# Patient Record
Sex: Male | Born: 1940 | Race: Black or African American | Hispanic: No | Marital: Single | Smoking: Never smoker
Health system: Southern US, Community
[De-identification: ages and names within clinical notes are randomized; demographics above are authoritative.]

## PROBLEM LIST (undated history)

## (undated) DIAGNOSIS — I10 Essential (primary) hypertension: Secondary | ICD-10-CM

## (undated) DIAGNOSIS — Z531 Procedure and treatment not carried out because of patient's decision for reasons of belief and group pressure: Secondary | ICD-10-CM

## (undated) DIAGNOSIS — I517 Cardiomegaly: Secondary | ICD-10-CM

## (undated) DIAGNOSIS — I4891 Unspecified atrial fibrillation: Secondary | ICD-10-CM

## (undated) DIAGNOSIS — I509 Heart failure, unspecified: Secondary | ICD-10-CM

## (undated) DIAGNOSIS — E785 Hyperlipidemia, unspecified: Secondary | ICD-10-CM

## (undated) DIAGNOSIS — IMO0001 Reserved for inherently not codable concepts without codable children: Secondary | ICD-10-CM

---

## 2011-12-03 ENCOUNTER — Other Ambulatory Visit: Payer: Self-pay | Admitting: Urology

## 2011-12-07 ENCOUNTER — Encounter (HOSPITAL_BASED_OUTPATIENT_CLINIC_OR_DEPARTMENT_OTHER): Admission: RE | Payer: Self-pay | Source: Ambulatory Visit

## 2011-12-07 ENCOUNTER — Ambulatory Visit (HOSPITAL_BASED_OUTPATIENT_CLINIC_OR_DEPARTMENT_OTHER): Admission: RE | Admit: 2011-12-07 | Payer: Self-pay | Source: Ambulatory Visit | Admitting: Urology

## 2011-12-07 SURGERY — BIOPSY, PROSTATE, RECTAL APPROACH, WITH US GUIDANCE
Anesthesia: Choice

## 2013-07-31 ENCOUNTER — Other Ambulatory Visit: Payer: Self-pay | Admitting: Internal Medicine

## 2013-07-31 DIAGNOSIS — R319 Hematuria, unspecified: Secondary | ICD-10-CM

## 2013-08-01 ENCOUNTER — Ambulatory Visit
Admission: RE | Admit: 2013-08-01 | Discharge: 2013-08-01 | Disposition: A | Discharge status: Home / Self Care | Payer: PRIVATE HEALTH INSURANCE | Source: Ambulatory Visit | Attending: Internal Medicine | Admitting: Internal Medicine

## 2013-08-01 DIAGNOSIS — R319 Hematuria, unspecified: Secondary | ICD-10-CM

## 2013-11-26 ENCOUNTER — Other Ambulatory Visit (HOSPITAL_COMMUNITY): Payer: Self-pay | Admitting: Internal Medicine

## 2013-11-26 DIAGNOSIS — I5030 Unspecified diastolic (congestive) heart failure: Secondary | ICD-10-CM

## 2013-11-27 ENCOUNTER — Ambulatory Visit (HOSPITAL_COMMUNITY): Payer: Medicare Other | Attending: Cardiology | Admitting: Radiology

## 2013-11-27 DIAGNOSIS — R609 Edema, unspecified: Secondary | ICD-10-CM | POA: Diagnosis present

## 2013-11-27 DIAGNOSIS — I5030 Unspecified diastolic (congestive) heart failure: Secondary | ICD-10-CM

## 2013-11-27 DIAGNOSIS — I509 Heart failure, unspecified: Secondary | ICD-10-CM | POA: Diagnosis not present

## 2013-11-27 DIAGNOSIS — R0602 Shortness of breath: Secondary | ICD-10-CM

## 2013-11-27 NOTE — Progress Notes (Signed)
Echocardiogram performed.  

## 2014-04-16 DIAGNOSIS — I10 Essential (primary) hypertension: Secondary | ICD-10-CM | POA: Diagnosis not present

## 2014-05-02 DIAGNOSIS — I4891 Unspecified atrial fibrillation: Secondary | ICD-10-CM | POA: Diagnosis not present

## 2014-05-02 DIAGNOSIS — I10 Essential (primary) hypertension: Secondary | ICD-10-CM | POA: Diagnosis not present

## 2014-05-02 DIAGNOSIS — Z7901 Long term (current) use of anticoagulants: Secondary | ICD-10-CM | POA: Diagnosis not present

## 2014-05-06 DIAGNOSIS — E782 Mixed hyperlipidemia: Secondary | ICD-10-CM | POA: Diagnosis not present

## 2014-05-06 DIAGNOSIS — I4891 Unspecified atrial fibrillation: Secondary | ICD-10-CM | POA: Diagnosis not present

## 2014-05-06 DIAGNOSIS — R600 Localized edema: Secondary | ICD-10-CM | POA: Diagnosis not present

## 2014-05-06 DIAGNOSIS — I10 Essential (primary) hypertension: Secondary | ICD-10-CM | POA: Diagnosis not present

## 2014-05-30 DIAGNOSIS — I4891 Unspecified atrial fibrillation: Secondary | ICD-10-CM | POA: Diagnosis not present

## 2014-05-30 DIAGNOSIS — Z7901 Long term (current) use of anticoagulants: Secondary | ICD-10-CM | POA: Diagnosis not present

## 2014-06-10 DIAGNOSIS — R972 Elevated prostate specific antigen [PSA]: Secondary | ICD-10-CM | POA: Diagnosis not present

## 2014-06-17 DIAGNOSIS — N401 Enlarged prostate with lower urinary tract symptoms: Secondary | ICD-10-CM | POA: Diagnosis not present

## 2014-06-17 DIAGNOSIS — R312 Other microscopic hematuria: Secondary | ICD-10-CM | POA: Diagnosis not present

## 2014-06-17 DIAGNOSIS — R351 Nocturia: Secondary | ICD-10-CM | POA: Diagnosis not present

## 2014-06-24 DIAGNOSIS — N400 Enlarged prostate without lower urinary tract symptoms: Secondary | ICD-10-CM | POA: Diagnosis not present

## 2014-06-24 DIAGNOSIS — N281 Cyst of kidney, acquired: Secondary | ICD-10-CM | POA: Diagnosis not present

## 2014-06-24 DIAGNOSIS — R312 Other microscopic hematuria: Secondary | ICD-10-CM | POA: Diagnosis not present

## 2014-06-27 DIAGNOSIS — I10 Essential (primary) hypertension: Secondary | ICD-10-CM | POA: Diagnosis not present

## 2014-06-27 DIAGNOSIS — Z7901 Long term (current) use of anticoagulants: Secondary | ICD-10-CM | POA: Diagnosis not present

## 2014-06-27 DIAGNOSIS — I4891 Unspecified atrial fibrillation: Secondary | ICD-10-CM | POA: Diagnosis not present

## 2014-07-09 DIAGNOSIS — R351 Nocturia: Secondary | ICD-10-CM | POA: Diagnosis not present

## 2014-07-09 DIAGNOSIS — N401 Enlarged prostate with lower urinary tract symptoms: Secondary | ICD-10-CM | POA: Diagnosis not present

## 2014-07-09 DIAGNOSIS — R972 Elevated prostate specific antigen [PSA]: Secondary | ICD-10-CM | POA: Diagnosis not present

## 2014-07-09 DIAGNOSIS — R312 Other microscopic hematuria: Secondary | ICD-10-CM | POA: Diagnosis not present

## 2014-07-25 DIAGNOSIS — I4891 Unspecified atrial fibrillation: Secondary | ICD-10-CM | POA: Diagnosis not present

## 2014-07-25 DIAGNOSIS — Z7901 Long term (current) use of anticoagulants: Secondary | ICD-10-CM | POA: Diagnosis not present

## 2014-08-20 DIAGNOSIS — I4891 Unspecified atrial fibrillation: Secondary | ICD-10-CM | POA: Diagnosis not present

## 2014-08-20 DIAGNOSIS — Z7901 Long term (current) use of anticoagulants: Secondary | ICD-10-CM | POA: Diagnosis not present

## 2014-09-16 DIAGNOSIS — E782 Mixed hyperlipidemia: Secondary | ICD-10-CM | POA: Diagnosis not present

## 2014-09-16 DIAGNOSIS — I10 Essential (primary) hypertension: Secondary | ICD-10-CM | POA: Diagnosis not present

## 2014-09-16 DIAGNOSIS — I4891 Unspecified atrial fibrillation: Secondary | ICD-10-CM | POA: Diagnosis not present

## 2014-09-17 DIAGNOSIS — Z7901 Long term (current) use of anticoagulants: Secondary | ICD-10-CM | POA: Diagnosis not present

## 2014-09-17 DIAGNOSIS — I4891 Unspecified atrial fibrillation: Secondary | ICD-10-CM | POA: Diagnosis not present

## 2014-09-23 DIAGNOSIS — I4891 Unspecified atrial fibrillation: Secondary | ICD-10-CM | POA: Diagnosis not present

## 2014-09-23 DIAGNOSIS — R600 Localized edema: Secondary | ICD-10-CM | POA: Diagnosis not present

## 2014-09-23 DIAGNOSIS — I10 Essential (primary) hypertension: Secondary | ICD-10-CM | POA: Diagnosis not present

## 2014-09-23 DIAGNOSIS — E782 Mixed hyperlipidemia: Secondary | ICD-10-CM | POA: Diagnosis not present

## 2014-10-22 DIAGNOSIS — I4891 Unspecified atrial fibrillation: Secondary | ICD-10-CM | POA: Diagnosis not present

## 2014-10-22 DIAGNOSIS — Z7901 Long term (current) use of anticoagulants: Secondary | ICD-10-CM | POA: Diagnosis not present

## 2014-11-19 DIAGNOSIS — Z7901 Long term (current) use of anticoagulants: Secondary | ICD-10-CM | POA: Diagnosis not present

## 2014-11-19 DIAGNOSIS — I4891 Unspecified atrial fibrillation: Secondary | ICD-10-CM | POA: Diagnosis not present

## 2014-12-17 DIAGNOSIS — I4891 Unspecified atrial fibrillation: Secondary | ICD-10-CM | POA: Diagnosis not present

## 2014-12-17 DIAGNOSIS — Z7901 Long term (current) use of anticoagulants: Secondary | ICD-10-CM | POA: Diagnosis not present

## 2014-12-17 DIAGNOSIS — I10 Essential (primary) hypertension: Secondary | ICD-10-CM | POA: Diagnosis not present

## 2014-12-20 DIAGNOSIS — R600 Localized edema: Secondary | ICD-10-CM | POA: Diagnosis not present

## 2014-12-20 DIAGNOSIS — I4891 Unspecified atrial fibrillation: Secondary | ICD-10-CM | POA: Diagnosis not present

## 2014-12-20 DIAGNOSIS — I5033 Acute on chronic diastolic (congestive) heart failure: Secondary | ICD-10-CM | POA: Diagnosis not present

## 2014-12-27 DIAGNOSIS — I5033 Acute on chronic diastolic (congestive) heart failure: Secondary | ICD-10-CM | POA: Diagnosis not present

## 2014-12-27 DIAGNOSIS — R600 Localized edema: Secondary | ICD-10-CM | POA: Diagnosis not present

## 2015-01-14 DIAGNOSIS — I4891 Unspecified atrial fibrillation: Secondary | ICD-10-CM | POA: Diagnosis not present

## 2015-01-14 DIAGNOSIS — Z7901 Long term (current) use of anticoagulants: Secondary | ICD-10-CM | POA: Diagnosis not present

## 2015-01-17 DIAGNOSIS — R600 Localized edema: Secondary | ICD-10-CM | POA: Diagnosis not present

## 2015-01-17 DIAGNOSIS — I5033 Acute on chronic diastolic (congestive) heart failure: Secondary | ICD-10-CM | POA: Diagnosis not present

## 2015-01-24 DIAGNOSIS — I5033 Acute on chronic diastolic (congestive) heart failure: Secondary | ICD-10-CM | POA: Diagnosis not present

## 2015-01-24 DIAGNOSIS — E782 Mixed hyperlipidemia: Secondary | ICD-10-CM | POA: Diagnosis not present

## 2015-01-24 DIAGNOSIS — I10 Essential (primary) hypertension: Secondary | ICD-10-CM | POA: Diagnosis not present

## 2015-01-24 DIAGNOSIS — R600 Localized edema: Secondary | ICD-10-CM | POA: Diagnosis not present

## 2015-02-11 DIAGNOSIS — I4891 Unspecified atrial fibrillation: Secondary | ICD-10-CM | POA: Diagnosis not present

## 2015-02-11 DIAGNOSIS — Z7901 Long term (current) use of anticoagulants: Secondary | ICD-10-CM | POA: Diagnosis not present

## 2015-03-10 DIAGNOSIS — M1A0790 Idiopathic chronic gout, unspecified ankle and foot, without tophus (tophi): Secondary | ICD-10-CM | POA: Diagnosis not present

## 2015-03-10 DIAGNOSIS — E782 Mixed hyperlipidemia: Secondary | ICD-10-CM | POA: Diagnosis not present

## 2015-03-10 DIAGNOSIS — I10 Essential (primary) hypertension: Secondary | ICD-10-CM | POA: Diagnosis not present

## 2015-03-10 DIAGNOSIS — R600 Localized edema: Secondary | ICD-10-CM | POA: Diagnosis not present

## 2015-03-10 DIAGNOSIS — I4891 Unspecified atrial fibrillation: Secondary | ICD-10-CM | POA: Diagnosis not present

## 2015-03-10 DIAGNOSIS — N183 Chronic kidney disease, stage 3 (moderate): Secondary | ICD-10-CM | POA: Diagnosis not present

## 2015-03-10 DIAGNOSIS — I5033 Acute on chronic diastolic (congestive) heart failure: Secondary | ICD-10-CM | POA: Diagnosis not present

## 2015-03-10 DIAGNOSIS — Z0000 Encounter for general adult medical examination without abnormal findings: Secondary | ICD-10-CM | POA: Diagnosis not present

## 2015-03-11 DIAGNOSIS — Z7901 Long term (current) use of anticoagulants: Secondary | ICD-10-CM | POA: Diagnosis not present

## 2015-03-11 DIAGNOSIS — I4891 Unspecified atrial fibrillation: Secondary | ICD-10-CM | POA: Diagnosis not present

## 2015-03-11 DIAGNOSIS — I10 Essential (primary) hypertension: Secondary | ICD-10-CM | POA: Diagnosis not present

## 2015-03-17 DIAGNOSIS — I10 Essential (primary) hypertension: Secondary | ICD-10-CM | POA: Diagnosis not present

## 2015-03-17 DIAGNOSIS — Z23 Encounter for immunization: Secondary | ICD-10-CM | POA: Diagnosis not present

## 2015-03-17 DIAGNOSIS — I4891 Unspecified atrial fibrillation: Secondary | ICD-10-CM | POA: Diagnosis not present

## 2015-03-17 DIAGNOSIS — I5033 Acute on chronic diastolic (congestive) heart failure: Secondary | ICD-10-CM | POA: Diagnosis not present

## 2015-03-17 DIAGNOSIS — R600 Localized edema: Secondary | ICD-10-CM | POA: Diagnosis not present

## 2015-04-08 DIAGNOSIS — I4891 Unspecified atrial fibrillation: Secondary | ICD-10-CM | POA: Diagnosis not present

## 2015-04-08 DIAGNOSIS — Z7901 Long term (current) use of anticoagulants: Secondary | ICD-10-CM | POA: Diagnosis not present

## 2015-04-09 DIAGNOSIS — I10 Essential (primary) hypertension: Secondary | ICD-10-CM | POA: Diagnosis not present

## 2015-04-09 DIAGNOSIS — E782 Mixed hyperlipidemia: Secondary | ICD-10-CM | POA: Diagnosis not present

## 2015-04-09 DIAGNOSIS — I4891 Unspecified atrial fibrillation: Secondary | ICD-10-CM | POA: Diagnosis not present

## 2015-04-09 DIAGNOSIS — R600 Localized edema: Secondary | ICD-10-CM | POA: Diagnosis not present

## 2015-05-06 DIAGNOSIS — Z7901 Long term (current) use of anticoagulants: Secondary | ICD-10-CM | POA: Diagnosis not present

## 2015-05-06 DIAGNOSIS — I4891 Unspecified atrial fibrillation: Secondary | ICD-10-CM | POA: Diagnosis not present

## 2015-05-27 DIAGNOSIS — R600 Localized edema: Secondary | ICD-10-CM | POA: Diagnosis not present

## 2015-06-03 DIAGNOSIS — Z7901 Long term (current) use of anticoagulants: Secondary | ICD-10-CM | POA: Diagnosis not present

## 2015-06-03 DIAGNOSIS — I10 Essential (primary) hypertension: Secondary | ICD-10-CM | POA: Diagnosis not present

## 2015-06-03 DIAGNOSIS — I4891 Unspecified atrial fibrillation: Secondary | ICD-10-CM | POA: Diagnosis not present

## 2015-06-03 DIAGNOSIS — R600 Localized edema: Secondary | ICD-10-CM | POA: Diagnosis not present

## 2015-06-11 DIAGNOSIS — R600 Localized edema: Secondary | ICD-10-CM | POA: Diagnosis not present

## 2015-06-17 DIAGNOSIS — Z7901 Long term (current) use of anticoagulants: Secondary | ICD-10-CM | POA: Diagnosis not present

## 2015-06-17 DIAGNOSIS — I4891 Unspecified atrial fibrillation: Secondary | ICD-10-CM | POA: Diagnosis not present

## 2015-07-09 DIAGNOSIS — I4891 Unspecified atrial fibrillation: Secondary | ICD-10-CM | POA: Diagnosis not present

## 2015-07-09 DIAGNOSIS — I10 Essential (primary) hypertension: Secondary | ICD-10-CM | POA: Diagnosis not present

## 2015-07-09 DIAGNOSIS — R600 Localized edema: Secondary | ICD-10-CM | POA: Diagnosis not present

## 2015-07-09 DIAGNOSIS — E782 Mixed hyperlipidemia: Secondary | ICD-10-CM | POA: Diagnosis not present

## 2015-07-16 DIAGNOSIS — R600 Localized edema: Secondary | ICD-10-CM | POA: Diagnosis not present

## 2015-07-16 DIAGNOSIS — E782 Mixed hyperlipidemia: Secondary | ICD-10-CM | POA: Diagnosis not present

## 2015-07-16 DIAGNOSIS — I10 Essential (primary) hypertension: Secondary | ICD-10-CM | POA: Diagnosis not present

## 2015-07-16 DIAGNOSIS — R197 Diarrhea, unspecified: Secondary | ICD-10-CM | POA: Diagnosis not present

## 2015-07-21 DIAGNOSIS — I4891 Unspecified atrial fibrillation: Secondary | ICD-10-CM | POA: Diagnosis not present

## 2015-07-21 DIAGNOSIS — Z7901 Long term (current) use of anticoagulants: Secondary | ICD-10-CM | POA: Diagnosis not present

## 2015-08-21 DIAGNOSIS — Z7901 Long term (current) use of anticoagulants: Secondary | ICD-10-CM | POA: Diagnosis not present

## 2015-08-21 DIAGNOSIS — I4891 Unspecified atrial fibrillation: Secondary | ICD-10-CM | POA: Diagnosis not present

## 2015-09-17 DIAGNOSIS — K529 Noninfective gastroenteritis and colitis, unspecified: Secondary | ICD-10-CM | POA: Diagnosis not present

## 2015-09-30 DIAGNOSIS — Z7901 Long term (current) use of anticoagulants: Secondary | ICD-10-CM | POA: Diagnosis not present

## 2015-09-30 DIAGNOSIS — I4891 Unspecified atrial fibrillation: Secondary | ICD-10-CM | POA: Diagnosis not present

## 2015-11-12 DIAGNOSIS — I10 Essential (primary) hypertension: Secondary | ICD-10-CM | POA: Diagnosis not present

## 2015-11-12 DIAGNOSIS — R600 Localized edema: Secondary | ICD-10-CM | POA: Diagnosis not present

## 2015-11-12 DIAGNOSIS — E782 Mixed hyperlipidemia: Secondary | ICD-10-CM | POA: Diagnosis not present

## 2015-11-18 DIAGNOSIS — Z7901 Long term (current) use of anticoagulants: Secondary | ICD-10-CM | POA: Diagnosis not present

## 2015-11-18 DIAGNOSIS — I4891 Unspecified atrial fibrillation: Secondary | ICD-10-CM | POA: Diagnosis not present

## 2015-11-21 ENCOUNTER — Emergency Department (HOSPITAL_COMMUNITY)
Admission: EM | Admit: 2015-11-21 | Discharge: 2015-11-21 | Disposition: A | Discharge status: Home / Self Care | Payer: Medicare Other | Attending: Emergency Medicine | Admitting: Emergency Medicine

## 2015-11-21 ENCOUNTER — Encounter (HOSPITAL_COMMUNITY): Payer: Self-pay | Admitting: Emergency Medicine

## 2015-11-21 DIAGNOSIS — S90822A Blister (nonthermal), left foot, initial encounter: Secondary | ICD-10-CM | POA: Diagnosis not present

## 2015-11-21 DIAGNOSIS — R600 Localized edema: Secondary | ICD-10-CM | POA: Diagnosis not present

## 2015-11-21 DIAGNOSIS — S90821A Blister (nonthermal), right foot, initial encounter: Secondary | ICD-10-CM | POA: Insufficient documentation

## 2015-11-21 DIAGNOSIS — I509 Heart failure, unspecified: Secondary | ICD-10-CM | POA: Diagnosis not present

## 2015-11-21 DIAGNOSIS — Z7901 Long term (current) use of anticoagulants: Secondary | ICD-10-CM | POA: Diagnosis not present

## 2015-11-21 DIAGNOSIS — Y939 Activity, unspecified: Secondary | ICD-10-CM | POA: Insufficient documentation

## 2015-11-21 DIAGNOSIS — Y929 Unspecified place or not applicable: Secondary | ICD-10-CM | POA: Diagnosis not present

## 2015-11-21 DIAGNOSIS — M7989 Other specified soft tissue disorders: Secondary | ICD-10-CM | POA: Diagnosis present

## 2015-11-21 DIAGNOSIS — X58XXXA Exposure to other specified factors, initial encounter: Secondary | ICD-10-CM | POA: Insufficient documentation

## 2015-11-21 DIAGNOSIS — I110 Hypertensive heart disease with heart failure: Secondary | ICD-10-CM | POA: Diagnosis not present

## 2015-11-21 DIAGNOSIS — Z79899 Other long term (current) drug therapy: Secondary | ICD-10-CM | POA: Insufficient documentation

## 2015-11-21 DIAGNOSIS — Y999 Unspecified external cause status: Secondary | ICD-10-CM | POA: Diagnosis not present

## 2015-11-21 HISTORY — DX: Essential (primary) hypertension: I10

## 2015-11-21 HISTORY — DX: Hyperlipidemia, unspecified: E78.5

## 2015-11-21 HISTORY — DX: Cardiomegaly: I51.7

## 2015-11-21 LAB — CBC WITH DIFFERENTIAL/PLATELET
BASOS PCT: 0 %
Basophils Absolute: 0 10*3/uL (ref 0.0–0.1)
EOS PCT: 1 %
Eosinophils Absolute: 0 10*3/uL (ref 0.0–0.7)
HCT: 39.5 % (ref 39.0–52.0)
Hemoglobin: 13.3 g/dL (ref 13.0–17.0)
LYMPHS ABS: 0.6 10*3/uL — AB (ref 0.7–4.0)
Lymphocytes Relative: 13 %
MCH: 32.4 pg (ref 26.0–34.0)
MCHC: 33.7 g/dL (ref 30.0–36.0)
MCV: 96.3 fL (ref 78.0–100.0)
MONO ABS: 0.5 10*3/uL (ref 0.1–1.0)
MONOS PCT: 12 %
Neutro Abs: 3 10*3/uL (ref 1.7–7.7)
Neutrophils Relative %: 74 %
PLATELETS: 230 10*3/uL (ref 150–400)
RBC: 4.1 MIL/uL — ABNORMAL LOW (ref 4.22–5.81)
RDW: 16.4 % — AB (ref 11.5–15.5)
WBC: 4.2 10*3/uL (ref 4.0–10.5)

## 2015-11-21 LAB — PROTIME-INR
INR: 1.67
PROTHROMBIN TIME: 19.9 s — AB (ref 11.4–15.2)

## 2015-11-21 LAB — COMPREHENSIVE METABOLIC PANEL
ALK PHOS: 72 U/L (ref 38–126)
ALT: 12 U/L — AB (ref 17–63)
AST: 25 U/L (ref 15–41)
Albumin: 3.1 g/dL — ABNORMAL LOW (ref 3.5–5.0)
Anion gap: 8 (ref 5–15)
BUN: 24 mg/dL — AB (ref 6–20)
CHLORIDE: 105 mmol/L (ref 101–111)
CO2: 30 mmol/L (ref 22–32)
CREATININE: 1.54 mg/dL — AB (ref 0.61–1.24)
Calcium: 9 mg/dL (ref 8.9–10.3)
GFR calc Af Amer: 49 mL/min — ABNORMAL LOW (ref >60)
GFR calc non Af Amer: 42 mL/min — ABNORMAL LOW (ref >60)
Glucose, Bld: 90 mg/dL (ref 65–99)
Potassium: 3.3 mmol/L — ABNORMAL LOW (ref 3.5–5.1)
SODIUM: 143 mmol/L (ref 135–145)
Total Bilirubin: 2.5 mg/dL — ABNORMAL HIGH (ref 0.3–1.2)
Total Protein: 7.1 g/dL (ref 6.5–8.1)

## 2015-11-21 MED ORDER — CEPHALEXIN 500 MG PO CAPS: 500.0000 mg | ORAL_CAPSULE | Freq: Two times a day (BID) | ORAL | 0 refills | Status: DC

## 2015-11-21 NOTE — ED Triage Notes (Signed)
Patient denies chest pain, shortness of breath.  

## 2015-11-21 NOTE — ED Provider Notes (Signed)
[facility]-EMERGENCY DEPT Provider Note   CSN: [id number] Arrival date & time: 11/21/15  1635     History   Chief Complaint Chief Complaint  Patient presents with  . Leg Swelling    HPI Vincent Gibson is a 75 y.o. male presenting with bilateral leg swelling and foot drainage. Patient states that he has leg swelling that is attributed to congestive heart failure. He takes furosemide as needed, typically every day. Last 2 days ago. He does not take it whenever he is going out because it makes him urinate so much. However couple days ago he woke up and had blisters on his feet, left worse than right. He popped these and since has been having increased clear drainage. He saw some redness and is worried about infection. He does not have any pain although he can tell his legs are swollen. They are symmetrically swollen. No chest pain or shortness of breath. No orthopnea.  HPI  Past Medical History:  Diagnosis Date  . Enlarged heart   . Hyperlipemia   . Hypertension     There are no active problems to display for this patient.   History reviewed. No pertinent surgical history.     Home Medications    Prior to Admission medications   Medication Sig Start Date End Date Taking? Authorizing Provider  atorvastatin (LIPITOR) 40 MG tablet Take 40 mg by mouth daily as needed (high cholesterol).   Yes Historical Provider, MD  furosemide (LASIX) 40 MG tablet Take 40 mg by mouth daily as needed for fluid or edema.   Yes Historical Provider, MD  irbesartan (AVAPRO) 300 MG tablet Take 300 mg by mouth daily.  10/14/15  Yes Historical Provider, MD  metoprolol succinate (TOPROL-XL) 50 MG 24 hr tablet Take 50 mg by mouth daily.  09/18/15  Yes Historical Provider, MD  warfarin (COUMADIN) 5 MG tablet Take 2.5 mg by mouth daily.  10/17/15  Yes Historical Provider, MD  cephALEXin (KEFLEX) 500 MG capsule Take 1 capsule (500 mg total) by mouth 2 (two) times daily. 11/21/15   Pricilla LovelessScott Bernardine Langworthy, MD    Family  History History reviewed. No pertinent family history.  Social History Social History  Substance Use Topics  . Smoking status: Never Smoker  . Smokeless tobacco: Never Used  . Alcohol use No     Allergies   Review of patient's allergies indicates not on file.   Review of Systems Review of Systems  Respiratory: Negative for shortness of breath.   Cardiovascular: Positive for leg swelling. Negative for chest pain.  Skin: Positive for color change and wound.  All other systems reviewed and are negative.    Physical Exam Updated Vital Signs BP 118/72 (BP Location: Left Arm)   Pulse 83   Temp 98 F (36.7 C) (Oral)   Ht 5\' 11"  (1.803 m)   Wt 267 lb (121.1 kg)   SpO2 95%   BMI 37.24 kg/m   Physical Exam  Constitutional: He is oriented to person, place, and time. He appears well-developed and well-nourished.  HENT:  Head: Normocephalic and atraumatic.  Right Ear: External ear normal.  Left Ear: External ear normal.  Nose: Nose normal.  Eyes: Right eye exhibits no discharge. Left eye exhibits no discharge.  Neck: Neck supple.  Cardiovascular: Normal rate, regular rhythm and normal heart sounds.   Pulmonary/Chest: Effort normal and breath sounds normal.  Abdominal: Soft. There is no tenderness.  Musculoskeletal: He exhibits edema (4+ bilateral pitting edema to BLE).  Feet:  Neurological: He is alert and oriented to person, place, and time.  Skin: Skin is warm and dry.  Nursing note and vitals reviewed.    ED Treatments / Results  Labs (all labs ordered are listed, but only abnormal results are displayed) Labs Reviewed  COMPREHENSIVE METABOLIC PANEL - Abnormal; Notable for the following:       Result Value   Potassium 3.3 (*)    BUN 24 (*)    Creatinine, Ser 1.54 (*)    Albumin 3.1 (*)    ALT 12 (*)    Total Bilirubin 2.5 (*)    GFR calc non Af Amer 42 (*)    GFR calc Af Amer 49 (*)    All other components within normal limits  CBC WITH  DIFFERENTIAL/PLATELET - Abnormal; Notable for the following:    RBC 4.10 (*)    RDW 16.4 (*)    Lymphs Abs 0.6 (*)    All other components within normal limits  PROTIME-INR - Abnormal; Notable for the following:    Prothrombin Time 19.9 (*)    All other components within normal limits    EKG  EKG Interpretation None       Radiology No results found.  Procedures Procedures (including critical care time)  Medications Ordered in ED Medications - No data to display   Initial Impression / Assessment and Plan / ED Course  I have reviewed the triage vital signs and the nursing notes.  Pertinent labs & imaging results that were available during my care of the patient were reviewed by me and considered in my medical decision making (see chart for details).  Clinical Course  Comment By Time  No CP/Dyspnea. Highly doubt pulmonary edema, especially with no hypoxia or dyspnea. Will check labs, likely cover feet with antibiotics although it's not obviously infected (although certainly set up for it). He does not want IV or PO lasix here. Pricilla LovelessScott Emanual Lamountain, MD 08/18 1752    No chest pain or shortness of breath. No indication this is acute heart failure with pulmonary edema. Will encourage patient to use Lasix twice per day for the next 3 days and then do once per day. Follow-up with his PCP. His creatinine is 1.5 but no baseline. He does not appear acutely dehydrated. He has follow up with his PCP in the next 5 days, encouraged him to keep this appointment and requests repeat blood work. Unclear if his blisters have early infection or not, given poor blood flow and significant edema, will give oral antibiotics.  Final Clinical Impressions(s) / ED Diagnoses   Final diagnoses:  Bilateral lower extremity edema  Blister of foot, left, initial encounter  Blister of right foot, initial encounter    New Prescriptions Discharge Medication List as of 11/21/2015  7:20 PM    START taking these  medications   Details  cephALEXin (KEFLEX) 500 MG capsule Take 1 capsule (500 mg total) by mouth 2 (two) times daily., Starting Fri 11/21/2015, Print         Pricilla LovelessScott Tita Terhaar, MD 11/21/15 2352

## 2015-11-21 NOTE — Discharge Instructions (Signed)
Take your furosemide (Lasix) 40 mg twice per day for the next 3 days. Go back to taking it as needed or once per day. Follow-up with your doctor. Your creatinine (kidney level) was 1.54. You need this rechecked next week.

## 2015-11-21 NOTE — ED Notes (Signed)
Patient is alert and oriented x3.  He was given DC instructions and follow up visit instructions.  Patient gave verbal understanding.  He was DC ambulatory under his own power to home.  V/S stable.  He was not showing any signs of distress on DC 

## 2015-11-21 NOTE — Progress Notes (Signed)
EDCM spoke to patient at bedside.  Patient reports he lives at home alone.  He reports he has many friends for support.  Patient reports he is able to complete his ADL's on his own at home.  Patient confirms his pcp is Dr. Nicholos Johnsamachandran and has seen his pcp last week for blood work.  Patient reports he has not had any falls at home.  [facility].  Patient politely declined and reports he has a contact through his insurance company to have an RN come on a daily basis.  EDCM informed patient he may also have his pcp arrange home health services in the future if he needs.  Patient thankful for services.  No further EDCM needs at this time.

## 2015-11-21 NOTE — ED Triage Notes (Signed)
Patient reports fluid weeping from bilateral feet which started last night. Reports that he woke up in the middle of the night with his entire bed wet.  Fluid noted to continue to be leaking from feet.  Patient reports he does take lasix but states this hasn't been working as it normally does.  Denies pain at present.

## 2015-11-26 ENCOUNTER — Other Ambulatory Visit: Payer: Self-pay | Admitting: Internal Medicine

## 2015-11-26 DIAGNOSIS — I4891 Unspecified atrial fibrillation: Secondary | ICD-10-CM | POA: Diagnosis not present

## 2015-11-26 DIAGNOSIS — R600 Localized edema: Secondary | ICD-10-CM

## 2015-11-26 DIAGNOSIS — E782 Mixed hyperlipidemia: Secondary | ICD-10-CM | POA: Diagnosis not present

## 2015-12-09 ENCOUNTER — Other Ambulatory Visit: Payer: Self-pay

## 2015-12-09 ENCOUNTER — Ambulatory Visit (HOSPITAL_COMMUNITY): Payer: Medicare Other | Attending: Cardiovascular Disease

## 2015-12-09 DIAGNOSIS — I340 Nonrheumatic mitral (valve) insufficiency: Secondary | ICD-10-CM | POA: Insufficient documentation

## 2015-12-09 DIAGNOSIS — I509 Heart failure, unspecified: Secondary | ICD-10-CM | POA: Insufficient documentation

## 2015-12-09 DIAGNOSIS — I313 Pericardial effusion (noninflammatory): Secondary | ICD-10-CM | POA: Diagnosis not present

## 2015-12-09 DIAGNOSIS — I4891 Unspecified atrial fibrillation: Secondary | ICD-10-CM | POA: Insufficient documentation

## 2015-12-09 DIAGNOSIS — R600 Localized edema: Secondary | ICD-10-CM | POA: Diagnosis not present

## 2015-12-09 DIAGNOSIS — E785 Hyperlipidemia, unspecified: Secondary | ICD-10-CM | POA: Insufficient documentation

## 2015-12-09 DIAGNOSIS — I110 Hypertensive heart disease with heart failure: Secondary | ICD-10-CM | POA: Insufficient documentation

## 2015-12-16 DIAGNOSIS — Z7901 Long term (current) use of anticoagulants: Secondary | ICD-10-CM | POA: Diagnosis not present

## 2015-12-16 DIAGNOSIS — I4891 Unspecified atrial fibrillation: Secondary | ICD-10-CM | POA: Diagnosis not present

## 2015-12-16 DIAGNOSIS — R600 Localized edema: Secondary | ICD-10-CM | POA: Diagnosis not present

## 2015-12-26 DIAGNOSIS — R600 Localized edema: Secondary | ICD-10-CM | POA: Diagnosis not present

## 2015-12-26 DIAGNOSIS — I4891 Unspecified atrial fibrillation: Secondary | ICD-10-CM | POA: Diagnosis not present

## 2015-12-26 DIAGNOSIS — I5032 Chronic diastolic (congestive) heart failure: Secondary | ICD-10-CM | POA: Diagnosis not present

## 2016-01-15 DIAGNOSIS — I4891 Unspecified atrial fibrillation: Secondary | ICD-10-CM | POA: Diagnosis not present

## 2016-01-15 DIAGNOSIS — Z7901 Long term (current) use of anticoagulants: Secondary | ICD-10-CM | POA: Diagnosis not present

## 2016-01-23 ENCOUNTER — Inpatient Hospital Stay (HOSPITAL_COMMUNITY)
Admission: EM | Admit: 2016-01-23 | Discharge: 2016-03-05 | DRG: 871 | Disposition: E | Payer: Medicare Other | Attending: Internal Medicine | Admitting: Internal Medicine

## 2016-01-23 ENCOUNTER — Emergency Department (HOSPITAL_COMMUNITY): Payer: Medicare Other

## 2016-01-23 ENCOUNTER — Encounter (HOSPITAL_COMMUNITY): Payer: Self-pay

## 2016-01-23 DIAGNOSIS — D500 Iron deficiency anemia secondary to blood loss (chronic): Secondary | ICD-10-CM | POA: Diagnosis not present

## 2016-01-23 DIAGNOSIS — E861 Hypovolemia: Secondary | ICD-10-CM | POA: Diagnosis present

## 2016-01-23 DIAGNOSIS — R404 Transient alteration of awareness: Secondary | ICD-10-CM | POA: Diagnosis not present

## 2016-01-23 DIAGNOSIS — I5042 Chronic combined systolic (congestive) and diastolic (congestive) heart failure: Secondary | ICD-10-CM | POA: Diagnosis not present

## 2016-01-23 DIAGNOSIS — I481 Persistent atrial fibrillation: Secondary | ICD-10-CM | POA: Diagnosis not present

## 2016-01-23 DIAGNOSIS — A419 Sepsis, unspecified organism: Secondary | ICD-10-CM

## 2016-01-23 DIAGNOSIS — I5031 Acute diastolic (congestive) heart failure: Secondary | ICD-10-CM | POA: Diagnosis not present

## 2016-01-23 DIAGNOSIS — E872 Acidosis, unspecified: Secondary | ICD-10-CM

## 2016-01-23 DIAGNOSIS — R188 Other ascites: Secondary | ICD-10-CM | POA: Diagnosis not present

## 2016-01-23 DIAGNOSIS — Z515 Encounter for palliative care: Secondary | ICD-10-CM | POA: Diagnosis not present

## 2016-01-23 DIAGNOSIS — J9601 Acute respiratory failure with hypoxia: Secondary | ICD-10-CM | POA: Diagnosis not present

## 2016-01-23 DIAGNOSIS — T68XXXA Hypothermia, initial encounter: Secondary | ICD-10-CM | POA: Diagnosis not present

## 2016-01-23 DIAGNOSIS — K2210 Ulcer of esophagus without bleeding: Secondary | ICD-10-CM | POA: Diagnosis present

## 2016-01-23 DIAGNOSIS — D649 Anemia, unspecified: Secondary | ICD-10-CM | POA: Diagnosis not present

## 2016-01-23 DIAGNOSIS — K560 Paralytic ileus: Secondary | ICD-10-CM | POA: Diagnosis not present

## 2016-01-23 DIAGNOSIS — I50812 Chronic right heart failure: Secondary | ICD-10-CM | POA: Diagnosis present

## 2016-01-23 DIAGNOSIS — R778 Other specified abnormalities of plasma proteins: Secondary | ICD-10-CM

## 2016-01-23 DIAGNOSIS — K920 Hematemesis: Secondary | ICD-10-CM

## 2016-01-23 DIAGNOSIS — K319 Disease of stomach and duodenum, unspecified: Secondary | ICD-10-CM | POA: Diagnosis present

## 2016-01-23 DIAGNOSIS — R338 Other retention of urine: Secondary | ICD-10-CM | POA: Diagnosis not present

## 2016-01-23 DIAGNOSIS — K429 Umbilical hernia without obstruction or gangrene: Secondary | ICD-10-CM | POA: Diagnosis present

## 2016-01-23 DIAGNOSIS — Z8249 Family history of ischemic heart disease and other diseases of the circulatory system: Secondary | ICD-10-CM

## 2016-01-23 DIAGNOSIS — I248 Other forms of acute ischemic heart disease: Secondary | ICD-10-CM | POA: Diagnosis not present

## 2016-01-23 DIAGNOSIS — I482 Chronic atrial fibrillation: Secondary | ICD-10-CM | POA: Diagnosis not present

## 2016-01-23 DIAGNOSIS — L03115 Cellulitis of right lower limb: Secondary | ICD-10-CM | POA: Diagnosis present

## 2016-01-23 DIAGNOSIS — Z7189 Other specified counseling: Secondary | ICD-10-CM

## 2016-01-23 DIAGNOSIS — R140 Abdominal distension (gaseous): Secondary | ICD-10-CM | POA: Diagnosis not present

## 2016-01-23 DIAGNOSIS — R339 Retention of urine, unspecified: Secondary | ICD-10-CM

## 2016-01-23 DIAGNOSIS — R9431 Abnormal electrocardiogram [ECG] [EKG]: Secondary | ICD-10-CM | POA: Diagnosis not present

## 2016-01-23 DIAGNOSIS — K210 Gastro-esophageal reflux disease with esophagitis: Secondary | ICD-10-CM | POA: Diagnosis present

## 2016-01-23 DIAGNOSIS — Z466 Encounter for fitting and adjustment of urinary device: Secondary | ICD-10-CM | POA: Diagnosis not present

## 2016-01-23 DIAGNOSIS — I130 Hypertensive heart and chronic kidney disease with heart failure and stage 1 through stage 4 chronic kidney disease, or unspecified chronic kidney disease: Secondary | ICD-10-CM | POA: Diagnosis not present

## 2016-01-23 DIAGNOSIS — I4510 Unspecified right bundle-branch block: Secondary | ICD-10-CM

## 2016-01-23 DIAGNOSIS — R7989 Other specified abnormal findings of blood chemistry: Secondary | ICD-10-CM

## 2016-01-23 DIAGNOSIS — N320 Bladder-neck obstruction: Secondary | ICD-10-CM | POA: Diagnosis present

## 2016-01-23 DIAGNOSIS — E46 Unspecified protein-calorie malnutrition: Secondary | ICD-10-CM | POA: Diagnosis present

## 2016-01-23 DIAGNOSIS — I4891 Unspecified atrial fibrillation: Secondary | ICD-10-CM | POA: Diagnosis not present

## 2016-01-23 DIAGNOSIS — I313 Pericardial effusion (noninflammatory): Secondary | ICD-10-CM | POA: Diagnosis present

## 2016-01-23 DIAGNOSIS — L89001 Pressure ulcer of unspecified elbow, stage 1: Secondary | ICD-10-CM | POA: Diagnosis not present

## 2016-01-23 DIAGNOSIS — R571 Hypovolemic shock: Secondary | ICD-10-CM | POA: Diagnosis not present

## 2016-01-23 DIAGNOSIS — R6521 Severe sepsis with septic shock: Secondary | ICD-10-CM | POA: Diagnosis not present

## 2016-01-23 DIAGNOSIS — I50810 Right heart failure, unspecified: Secondary | ICD-10-CM

## 2016-01-23 DIAGNOSIS — L89153 Pressure ulcer of sacral region, stage 3: Secondary | ICD-10-CM | POA: Diagnosis present

## 2016-01-23 DIAGNOSIS — K652 Spontaneous bacterial peritonitis: Secondary | ICD-10-CM | POA: Diagnosis not present

## 2016-01-23 DIAGNOSIS — R680 Hypothermia, not associated with low environmental temperature: Secondary | ICD-10-CM | POA: Diagnosis present

## 2016-01-23 DIAGNOSIS — N401 Enlarged prostate with lower urinary tract symptoms: Secondary | ICD-10-CM | POA: Diagnosis present

## 2016-01-23 DIAGNOSIS — I5022 Chronic systolic (congestive) heart failure: Secondary | ICD-10-CM | POA: Diagnosis not present

## 2016-01-23 DIAGNOSIS — R609 Edema, unspecified: Secondary | ICD-10-CM | POA: Diagnosis not present

## 2016-01-23 DIAGNOSIS — Z6834 Body mass index (BMI) 34.0-34.9, adult: Secondary | ICD-10-CM

## 2016-01-23 DIAGNOSIS — N183 Chronic kidney disease, stage 3 (moderate): Secondary | ICD-10-CM | POA: Diagnosis present

## 2016-01-23 DIAGNOSIS — L02415 Cutaneous abscess of right lower limb: Secondary | ICD-10-CM | POA: Diagnosis present

## 2016-01-23 DIAGNOSIS — Z79899 Other long term (current) drug therapy: Secondary | ICD-10-CM

## 2016-01-23 DIAGNOSIS — Z531 Procedure and treatment not carried out because of patient's decision for reasons of belief and group pressure: Secondary | ICD-10-CM | POA: Diagnosis present

## 2016-01-23 DIAGNOSIS — I272 Pulmonary hypertension, unspecified: Secondary | ICD-10-CM | POA: Diagnosis present

## 2016-01-23 DIAGNOSIS — A4159 Other Gram-negative sepsis: Secondary | ICD-10-CM | POA: Diagnosis not present

## 2016-01-23 DIAGNOSIS — E8809 Other disorders of plasma-protein metabolism, not elsewhere classified: Secondary | ICD-10-CM

## 2016-01-23 DIAGNOSIS — K567 Ileus, unspecified: Secondary | ICD-10-CM

## 2016-01-23 DIAGNOSIS — R933 Abnormal findings on diagnostic imaging of other parts of digestive tract: Secondary | ICD-10-CM | POA: Diagnosis not present

## 2016-01-23 DIAGNOSIS — D696 Thrombocytopenia, unspecified: Secondary | ICD-10-CM | POA: Diagnosis present

## 2016-01-23 DIAGNOSIS — K922 Gastrointestinal hemorrhage, unspecified: Secondary | ICD-10-CM | POA: Diagnosis not present

## 2016-01-23 DIAGNOSIS — K264 Chronic or unspecified duodenal ulcer with hemorrhage: Secondary | ICD-10-CM | POA: Diagnosis present

## 2016-01-23 DIAGNOSIS — R531 Weakness: Secondary | ICD-10-CM | POA: Diagnosis not present

## 2016-01-23 DIAGNOSIS — I5043 Acute on chronic combined systolic (congestive) and diastolic (congestive) heart failure: Secondary | ICD-10-CM | POA: Diagnosis not present

## 2016-01-23 DIAGNOSIS — M6282 Rhabdomyolysis: Secondary | ICD-10-CM | POA: Diagnosis not present

## 2016-01-23 DIAGNOSIS — K449 Diaphragmatic hernia without obstruction or gangrene: Secondary | ICD-10-CM | POA: Diagnosis present

## 2016-01-23 DIAGNOSIS — I5041 Acute combined systolic (congestive) and diastolic (congestive) heart failure: Secondary | ICD-10-CM | POA: Diagnosis not present

## 2016-01-23 DIAGNOSIS — I959 Hypotension, unspecified: Secondary | ICD-10-CM | POA: Diagnosis not present

## 2016-01-23 DIAGNOSIS — E860 Dehydration: Secondary | ICD-10-CM | POA: Diagnosis present

## 2016-01-23 DIAGNOSIS — D638 Anemia in other chronic diseases classified elsewhere: Secondary | ICD-10-CM | POA: Diagnosis present

## 2016-01-23 DIAGNOSIS — K760 Fatty (change of) liver, not elsewhere classified: Secondary | ICD-10-CM | POA: Diagnosis present

## 2016-01-23 DIAGNOSIS — D689 Coagulation defect, unspecified: Secondary | ICD-10-CM

## 2016-01-23 DIAGNOSIS — D684 Acquired coagulation factor deficiency: Secondary | ICD-10-CM | POA: Diagnosis not present

## 2016-01-23 DIAGNOSIS — K209 Esophagitis, unspecified: Secondary | ICD-10-CM | POA: Diagnosis not present

## 2016-01-23 DIAGNOSIS — E785 Hyperlipidemia, unspecified: Secondary | ICD-10-CM | POA: Diagnosis present

## 2016-01-23 DIAGNOSIS — L8990 Pressure ulcer of unspecified site, unspecified stage: Secondary | ICD-10-CM | POA: Diagnosis present

## 2016-01-23 DIAGNOSIS — K3189 Other diseases of stomach and duodenum: Secondary | ICD-10-CM | POA: Diagnosis not present

## 2016-01-23 DIAGNOSIS — R748 Abnormal levels of other serum enzymes: Secondary | ICD-10-CM | POA: Diagnosis not present

## 2016-01-23 DIAGNOSIS — R600 Localized edema: Secondary | ICD-10-CM | POA: Diagnosis present

## 2016-01-23 DIAGNOSIS — I509 Heart failure, unspecified: Secondary | ICD-10-CM | POA: Diagnosis not present

## 2016-01-23 DIAGNOSIS — Z7901 Long term (current) use of anticoagulants: Secondary | ICD-10-CM

## 2016-01-23 DIAGNOSIS — J9600 Acute respiratory failure, unspecified whether with hypoxia or hypercapnia: Secondary | ICD-10-CM

## 2016-01-23 DIAGNOSIS — R7881 Bacteremia: Secondary | ICD-10-CM | POA: Diagnosis not present

## 2016-01-23 DIAGNOSIS — R0600 Dyspnea, unspecified: Secondary | ICD-10-CM | POA: Diagnosis not present

## 2016-01-23 DIAGNOSIS — E870 Hyperosmolality and hypernatremia: Secondary | ICD-10-CM | POA: Diagnosis not present

## 2016-01-23 DIAGNOSIS — K921 Melena: Secondary | ICD-10-CM | POA: Diagnosis not present

## 2016-01-23 DIAGNOSIS — R601 Generalized edema: Secondary | ICD-10-CM | POA: Diagnosis not present

## 2016-01-23 DIAGNOSIS — R0602 Shortness of breath: Secondary | ICD-10-CM | POA: Diagnosis not present

## 2016-01-23 DIAGNOSIS — Z66 Do not resuscitate: Secondary | ICD-10-CM | POA: Diagnosis not present

## 2016-01-23 DIAGNOSIS — K7460 Unspecified cirrhosis of liver: Secondary | ICD-10-CM | POA: Diagnosis present

## 2016-01-23 DIAGNOSIS — Z4659 Encounter for fitting and adjustment of other gastrointestinal appliance and device: Secondary | ICD-10-CM

## 2016-01-23 DIAGNOSIS — N179 Acute kidney failure, unspecified: Secondary | ICD-10-CM | POA: Diagnosis not present

## 2016-01-23 DIAGNOSIS — Z4682 Encounter for fitting and adjustment of non-vascular catheter: Secondary | ICD-10-CM | POA: Diagnosis not present

## 2016-01-23 DIAGNOSIS — R932 Abnormal findings on diagnostic imaging of liver and biliary tract: Secondary | ICD-10-CM | POA: Diagnosis not present

## 2016-01-23 DIAGNOSIS — D62 Acute posthemorrhagic anemia: Secondary | ICD-10-CM | POA: Diagnosis not present

## 2016-01-23 DIAGNOSIS — R1312 Dysphagia, oropharyngeal phase: Secondary | ICD-10-CM | POA: Diagnosis present

## 2016-01-23 DIAGNOSIS — E162 Hypoglycemia, unspecified: Secondary | ICD-10-CM | POA: Diagnosis present

## 2016-01-23 DIAGNOSIS — I129 Hypertensive chronic kidney disease with stage 1 through stage 4 chronic kidney disease, or unspecified chronic kidney disease: Secondary | ICD-10-CM | POA: Diagnosis not present

## 2016-01-23 DIAGNOSIS — E876 Hypokalemia: Secondary | ICD-10-CM | POA: Diagnosis not present

## 2016-01-23 DIAGNOSIS — K2950 Unspecified chronic gastritis without bleeding: Secondary | ICD-10-CM | POA: Diagnosis present

## 2016-01-23 DIAGNOSIS — K767 Hepatorenal syndrome: Secondary | ICD-10-CM | POA: Diagnosis present

## 2016-01-23 DIAGNOSIS — I4581 Long QT syndrome: Secondary | ICD-10-CM | POA: Diagnosis present

## 2016-01-23 DIAGNOSIS — Z452 Encounter for adjustment and management of vascular access device: Secondary | ICD-10-CM | POA: Diagnosis not present

## 2016-01-23 DIAGNOSIS — R579 Shock, unspecified: Secondary | ICD-10-CM

## 2016-01-23 HISTORY — DX: Unspecified atrial fibrillation: I48.91

## 2016-01-23 HISTORY — DX: Heart failure, unspecified: I50.9

## 2016-01-23 HISTORY — DX: Reserved for inherently not codable concepts without codable children: IMO0001

## 2016-01-23 HISTORY — DX: Procedure and treatment not carried out because of patient's decision for reasons of belief and group pressure: Z53.1

## 2016-01-23 LAB — PROTIME-INR
INR: 6.77
Prothrombin Time: 61 seconds — ABNORMAL HIGH (ref 11.4–15.2)

## 2016-01-23 LAB — MRSA PCR SCREENING: MRSA by PCR: NEGATIVE

## 2016-01-23 LAB — COMPREHENSIVE METABOLIC PANEL
ALK PHOS: 94 U/L (ref 38–126)
ALT: 29 U/L (ref 17–63)
AST: 65 U/L — AB (ref 15–41)
Albumin: 3.1 g/dL — ABNORMAL LOW (ref 3.5–5.0)
Anion gap: 17 — ABNORMAL HIGH (ref 5–15)
BUN: 86 mg/dL — AB (ref 6–20)
CALCIUM: 8.9 mg/dL (ref 8.9–10.3)
CHLORIDE: 105 mmol/L (ref 101–111)
CO2: 21 mmol/L — AB (ref 22–32)
CREATININE: 3.32 mg/dL — AB (ref 0.61–1.24)
GFR calc non Af Amer: 17 mL/min — ABNORMAL LOW (ref >60)
GFR, EST AFRICAN AMERICAN: 19 mL/min — AB (ref >60)
GLUCOSE: 90 mg/dL (ref 65–99)
Potassium: 4.3 mmol/L (ref 3.5–5.1)
SODIUM: 143 mmol/L (ref 135–145)
Total Bilirubin: 4.7 mg/dL — ABNORMAL HIGH (ref 0.3–1.2)
Total Protein: 7.4 g/dL (ref 6.5–8.1)

## 2016-01-23 LAB — GLUCOSE, CAPILLARY
Glucose-Capillary: 89 mg/dL (ref 65–99)
Glucose-Capillary: 84 mg/dL (ref 65–99)

## 2016-01-23 LAB — BLOOD GAS, VENOUS
ACID-BASE DEFICIT: 4.5 mmol/L — AB (ref 0.0–2.0)
Bicarbonate: 20.1 mmol/L (ref 20.0–28.0)
O2 SAT: 69.5 %
PCO2 VEN: 37.8 mmHg — AB (ref 44.0–60.0)
PO2 VEN: 41.9 mmHg (ref 32.0–45.0)
Patient temperature: 98.6
pH, Ven: 7.346 (ref 7.250–7.430)

## 2016-01-23 LAB — URINALYSIS, ROUTINE W REFLEX MICROSCOPIC
GLUCOSE, UA: NEGATIVE mg/dL
KETONES UR: NEGATIVE mg/dL
LEUKOCYTES UA: NEGATIVE
NITRITE: NEGATIVE
PH: 5.5 (ref 5.0–8.0)
PROTEIN: NEGATIVE mg/dL
Specific Gravity, Urine: 1.018 (ref 1.005–1.030)

## 2016-01-23 LAB — CBC WITH DIFFERENTIAL/PLATELET
Basophils Absolute: 0 10*3/uL (ref 0.0–0.1)
Basophils Relative: 0 %
EOS PCT: 0 %
Eosinophils Absolute: 0 10*3/uL (ref 0.0–0.7)
HEMATOCRIT: 34.8 % — AB (ref 39.0–52.0)
Hemoglobin: 11.9 g/dL — ABNORMAL LOW (ref 13.0–17.0)
LYMPHS ABS: 0.3 10*3/uL — AB (ref 0.7–4.0)
LYMPHS PCT: 2 %
MCH: 31.8 pg (ref 26.0–34.0)
MCHC: 34.2 g/dL (ref 30.0–36.0)
MCV: 93 fL (ref 78.0–100.0)
MONO ABS: 0.7 10*3/uL (ref 0.1–1.0)
MONOS PCT: 5 %
NEUTROS ABS: 12.3 10*3/uL — AB (ref 1.7–7.7)
Neutrophils Relative %: 93 %
PLATELETS: 205 10*3/uL (ref 150–400)
RBC: 3.74 MIL/uL — ABNORMAL LOW (ref 4.22–5.81)
RDW: 15.9 % — AB (ref 11.5–15.5)
WBC: 13.3 10*3/uL — ABNORMAL HIGH (ref 4.0–10.5)

## 2016-01-23 LAB — CBG MONITORING, ED
GLUCOSE-CAPILLARY: 96 mg/dL (ref 65–99)
Glucose-Capillary: 26 mg/dL — CL (ref 65–99)
Glucose-Capillary: 102 mg/dL — ABNORMAL HIGH (ref 65–99)

## 2016-01-23 LAB — BRAIN NATRIURETIC PEPTIDE: B Natriuretic Peptide: 2155.4 pg/mL — ABNORMAL HIGH (ref 0.0–100.0)

## 2016-01-23 LAB — TROPONIN I: TROPONIN I: 0.36 ng/mL — AB (ref <0.03)

## 2016-01-23 LAB — SODIUM, URINE, RANDOM: Sodium, Ur: <10 mmol/L

## 2016-01-23 LAB — I-STAT CG4 LACTIC ACID, ED: LACTIC ACID, VENOUS: 4.63 mmol/L — AB (ref 0.5–1.9)

## 2016-01-23 LAB — URINE MICROSCOPIC-ADD ON

## 2016-01-23 LAB — LACTIC ACID, PLASMA
LACTIC ACID, VENOUS: 2.6 mmol/L — AB (ref 0.5–1.9)
Lactic Acid, Venous: 3 mmol/L (ref 0.5–1.9)
Lactic Acid, Venous: 3.1 mmol/L (ref 0.5–1.9)

## 2016-01-23 LAB — APTT: APTT: 56 s — AB (ref 24–36)

## 2016-01-23 LAB — PROCALCITONIN: PROCALCITONIN: 16.25 ng/mL

## 2016-01-23 LAB — CK: CK TOTAL: 528 U/L — AB (ref 49–397)

## 2016-01-23 LAB — MAGNESIUM: Magnesium: 2.4 mg/dL (ref 1.7–2.4)

## 2016-01-23 MED ORDER — DEXTROSE 50 % IV SOLN
50.0000 mL | Freq: Once | INTRAVENOUS | Status: AC
  Administered 2016-01-23: 50 mL via INTRAVENOUS

## 2016-01-23 MED ORDER — SODIUM CHLORIDE 0.9 % IV BOLUS (SEPSIS)
1000.0000 mL | Freq: Once | INTRAVENOUS | Status: AC
  Administered 2016-01-23: 1000 mL via INTRAVENOUS

## 2016-01-23 MED ORDER — ACETAMINOPHEN 325 MG PO TABS
650.0000 mg | ORAL_TABLET | Freq: Four times a day (QID) | ORAL | Status: DC | PRN
  Administered 2016-01-24 (×2): 650 mg via ORAL
  Filled 2016-01-23 (×3): qty 2

## 2016-01-23 MED ORDER — HEPARIN SODIUM (PORCINE) 5000 UNIT/ML IJ SOLN: 5000.0000 [IU] | Freq: Three times a day (TID) | INTRAMUSCULAR | Status: DC

## 2016-01-23 MED ORDER — VANCOMYCIN HCL IN DEXTROSE 1-5 GM/200ML-% IV SOLN
1000.0000 mg | Freq: Once | INTRAVENOUS | Status: DC
  Administered 2016-01-23: 1000 mg via INTRAVENOUS
  Filled 2016-01-23: qty 200

## 2016-01-23 MED ORDER — PIPERACILLIN-TAZOBACTAM 3.375 G IVPB 30 MIN
3.3750 g | Freq: Once | INTRAVENOUS | Status: AC
  Administered 2016-01-23: 3.375 g via INTRAVENOUS
  Filled 2016-01-23: qty 50

## 2016-01-23 MED ORDER — LIDOCAINE HCL 2 % EX GEL
1.0000 | Freq: Once | CUTANEOUS | Status: DC
Start: 2016-01-23 — End: 2016-02-09
  Filled 2016-01-23: qty 11

## 2016-01-23 MED ORDER — ACETAMINOPHEN 650 MG RE SUPP
650.0000 mg | Freq: Four times a day (QID) | RECTAL | Status: DC | PRN
Start: 2016-01-23 — End: 2016-02-09

## 2016-01-23 MED ORDER — SODIUM CHLORIDE 0.9 % IV SOLN
INTRAVENOUS | Status: DC
  Administered 2016-01-23: 11:00:00 via INTRAVENOUS

## 2016-01-23 MED ORDER — SODIUM CHLORIDE 0.9 % IV SOLN: 1500.0000 mg | INTRAVENOUS | Status: DC

## 2016-01-23 MED ORDER — PIPERACILLIN-TAZOBACTAM IN DEX 2-0.25 GM/50ML IV SOLN
2.2500 g | Freq: Four times a day (QID) | INTRAVENOUS | Status: DC
  Administered 2016-01-23 – 2016-01-26 (×10): 2.25 g via INTRAVENOUS
  Filled 2016-01-23 (×12): qty 50

## 2016-01-23 MED ORDER — VANCOMYCIN HCL 10 G IV SOLR
1500.0000 mg | INTRAVENOUS | Status: DC
  Filled 2016-01-23: qty 1500

## 2016-01-23 MED ORDER — DEXTROSE 50 % IV SOLN
INTRAVENOUS | Status: AC
  Administered 2016-01-23: 50 mL via INTRAVENOUS
  Filled 2016-01-23: qty 50

## 2016-01-23 MED ORDER — SENNOSIDES-DOCUSATE SODIUM 8.6-50 MG PO TABS
1.0000 | ORAL_TABLET | Freq: Every evening | ORAL | Status: DC | PRN
  Administered 2016-02-01: 1 via ORAL
  Filled 2016-01-23: qty 1

## 2016-01-23 NOTE — ED Notes (Signed)
Lactic: 4.63  MD and RN made aware

## 2016-01-23 NOTE — ED Notes (Signed)
RN aware of critical glucose level

## 2016-01-23 NOTE — ED Provider Notes (Signed)
[facility] DEPT Provider Note   CSN: [id number] Arrival date & time: 01/04/2016  1028     History   Chief Complaint Chief Complaint  Patient presents with  . Fatigue    HPI PARTICK MUSSELMAN is a 75 y.o. male.  HPI patient reports that he was trying to take a bath on Tuesday. He reports he got weak and he was unable to get out of his bathtub. He denies he fell. He however had a lie in his bathtub for 3 days. He reports he was able to use his foot to let the water out. He has not had his medications for 3 days. He reports he is a little sore on his backside but he denies any focal area of pain. He reports his abdomen is swollen but he denies pain. He reports he has not been urinating much since he has been in his tub. He normally takes Lasix. He reports he chronically has severely swollen legs with wounds on them.  Past Medical History:  Diagnosis Date  . A-fib ([place])   . CHF (congestive heart failure) ([place])   . Enlarged heart   . Hyperlipemia   . Hypertension     Patient Active Problem List   Diagnosis Date Noted  . Pressure injury of skin 01/24/2016  . AKI (acute kidney injury) ([place]) 02/02/2016  . Urinary retention 01/10/2016  . Prolonged Q-T interval on ECG 01/14/2016  . Atrial fibrillation ([place]) 01/06/2016  . Chronic systolic heart failure ([place]) 01/26/2016  . Sepsis ([place]) 01/05/2016  . Cellulitis and abscess of right lower extremity 01/11/2016    History reviewed. No pertinent surgical history.     Home Medications    Prior to Admission medications   Medication Sig Start Date End Date Taking? Authorizing Provider  atorvastatin (LIPITOR) 40 MG tablet Take 40 mg by mouth daily as needed (high cholesterol).   Yes Historical Provider, MD  furosemide (LASIX) 40 MG tablet Take 40 mg by mouth daily as needed for fluid or edema.   Yes Historical Provider, MD  irbesartan (AVAPRO) 300 MG tablet Take 300 mg by mouth daily.  10/14/15  Yes Historical Provider, MD  metoprolol  succinate (TOPROL-XL) 50 MG 24 hr tablet Take 50 mg by mouth daily.  09/18/15  Yes Historical Provider, MD  warfarin (COUMADIN) 5 MG tablet Take 2.5 mg by mouth daily.  10/17/15  Yes Historical Provider, MD  cephALEXin (KEFLEX) 500 MG capsule Take 1 capsule (500 mg total) by mouth 2 (two) times daily. Patient not taking: Reported on 01/15/2016 11/21/15   Sherwood Gambler, MD    Family History History reviewed. No pertinent family history.  Social History Social History  Substance Use Topics  . Smoking status: Never Smoker  . Smokeless tobacco: Never Used  . Alcohol use No     Allergies   Review of patient's allergies indicates no known allergies.   Review of Systems Review of Systems 10 Systems reviewed and are negative for acute change except as noted in the HPI.   Physical Exam Updated Vital Signs BP (!) 100/43   Pulse 92   Temp 97.7 F (36.5 C) (Axillary)   Resp (!) 21   Ht 5' 11"  (1.803 m)   Wt 276 lb 14.4 oz (125.6 kg)   SpO2 94%   BMI 38.62 kg/m   Physical Exam  Constitutional:  Patient is morbidly obese. He is weak and fatigued in appearance. Color is pale. No respiratory distress at rest. GCS is 15 with  clear mental status.  HENT:  Nose: Nose normal.  No hematomas to the scalp. Posterior scalp is slightly flattened but the skin does not have breakdown. Lips are very dried and cracked. Tongue however is pink and moist.  Eyes: EOM are normal. Pupils are equal, round, and reactive to light.  Neck: Neck supple.  Patient does not endorse cervical spine pain to palpation.  Cardiovascular:  Irregularly irregular. Tachycardia.  Pulmonary/Chest: Effort normal and breath sounds normal.  Abdominal:  Abdomen is distended. Patient does not endorse pain to palpation. Patient has a umbilical hernia. This is soft and nontender.  Musculoskeletal:  Patient does not have deformity or pain with range of motion of upper extremity is. Lower extremities have severe skin changes  consistent with chronic edema. There is scaling and crenelation. There is a superficial abrasion to the left lower lateral leg approximately 3 cm. Significant diffuse erythema of bilateral lower extremities.  I've examined the patient's back. He does have some erythema to the upper back around the nape. This is a pressure point but does not have any breakdown. He endorses only mild tenderness in this region. Examination of the sacrum and the buttock shows some erythema sacrum but again no active breakdown.  Neurological:  Patient is a fatigued in appearance but he is appropriate. He is GCS of 15. There are no focal motor deficits.  Skin: Skin is warm and dry. There is pallor.  Psychiatric: He has a normal mood and affect.     ED Treatments / Results  Labs (all labs ordered are listed, but only abnormal results are displayed) Labs Reviewed  COMPREHENSIVE METABOLIC PANEL - Abnormal; Notable for the following:       Result Value   CO2 21 (*)    BUN 86 (*)    Creatinine, Ser 3.32 (*)    Albumin 3.1 (*)    AST 65 (*)    Total Bilirubin 4.7 (*)    GFR calc non Af Amer 17 (*)    GFR calc Af Amer 19 (*)    Anion gap 17 (*)    All other components within normal limits  BRAIN NATRIURETIC PEPTIDE - Abnormal; Notable for the following:    B Natriuretic Peptide 2,155.4 (*)    All other components within normal limits  TROPONIN I - Abnormal; Notable for the following:    Troponin I 0.36 (*)    All other components within normal limits  CBC WITH DIFFERENTIAL/PLATELET - Abnormal; Notable for the following:    WBC 13.3 (*)    RBC 3.74 (*)    Hemoglobin 11.9 (*)    HCT 34.8 (*)    RDW 15.9 (*)    Neutro Abs 12.3 (*)    Lymphs Abs 0.3 (*)    All other components within normal limits  PROTIME-INR - Abnormal; Notable for the following:    Prothrombin Time 61.0 (*)    INR 6.77 (*)    All other components within normal limits  URINALYSIS, ROUTINE W REFLEX MICROSCOPIC (NOT AT [facility]) - Abnormal;  Notable for the following:    Color, Urine AMBER (*)    APPearance CLOUDY (*)    Hgb urine dipstick SMALL (*)    Bilirubin Urine SMALL (*)    All other components within normal limits  BLOOD GAS, VENOUS - Abnormal; Notable for the following:    pCO2, Ven 37.8 (*)    Acid-base deficit 4.5 (*)    All other components within normal limits  CK -  Abnormal; Notable for the following:    Total CK 528 (*)    All other components within normal limits  LACTIC ACID, PLASMA - Abnormal; Notable for the following:    Lactic Acid, Venous 3.0 (*)    All other components within normal limits  LACTIC ACID, PLASMA - Abnormal; Notable for the following:    Lactic Acid, Venous 3.1 (*)    All other components within normal limits  APTT - Abnormal; Notable for the following:    aPTT 56 (*)    All other components within normal limits  URINE MICROSCOPIC-ADD ON - Abnormal; Notable for the following:    Squamous Epithelial / LPF 0-5 (*)    Bacteria, UA FEW (*)    All other components within normal limits  LACTIC ACID, PLASMA - Abnormal; Notable for the following:    Lactic Acid, Venous 2.6 (*)    All other components within normal limits  BASIC METABOLIC PANEL - Abnormal; Notable for the following:    Glucose, Bld 113 (*)    BUN 87 (*)    Creatinine, Ser 3.30 (*)    Calcium 7.9 (*)    GFR calc non Af Amer 17 (*)    GFR calc Af Amer 20 (*)    All other components within normal limits  CBC - Abnormal; Notable for the following:    WBC 15.5 (*)    RBC 3.45 (*)    Hemoglobin 11.0 (*)    HCT 32.2 (*)    RDW 15.9 (*)    All other components within normal limits  LACTIC ACID, PLASMA - Abnormal; Notable for the following:    Lactic Acid, Venous 2.2 (*)    All other components within normal limits  CBG MONITORING, ED - Abnormal; Notable for the following:    Glucose-Capillary 26 (*)    All other components within normal limits  I-STAT CG4 LACTIC ACID, ED - Abnormal; Notable for the following:     Lactic Acid, Venous 4.63 (*)    All other components within normal limits  CBG MONITORING, ED - Abnormal; Notable for the following:    Glucose-Capillary 102 (*)    All other components within normal limits  CULTURE, BLOOD (ROUTINE X 2)  MRSA PCR SCREENING  BLOOD CULTURE ID PANEL (REFLEXED)  CULTURE, BLOOD (ROUTINE X 2)  URINE CULTURE  PROCALCITONIN  MAGNESIUM  SODIUM, URINE, RANDOM  GLUCOSE, CAPILLARY  GLUCOSE, CAPILLARY  GLUCOSE, CAPILLARY  GLUCOSE, CAPILLARY  GLUCOSE, CAPILLARY  LACTIC ACID, PLASMA  PROTIME-INR  CBG MONITORING, ED    EKG  EKG Interpretation  Date/Time:  Friday January 23 2016 10:54:08 EDT Ventricular Rate:  108 PR Interval:    QRS Duration: 149 QT Interval:  400 QTC Calculation: 537 R Axis:   -84 Text Interpretation:  atrial fibrillstion Right bundle branch block Inferior infarct, old no old comparison Confirmed by Johnney Killian, MD, Jeannie Done [phone]) on 01/13/2016 11:08:54 AM       Radiology Dg Abd Acute W/chest  Result Date: 01/07/2016 CLINICAL DATA:  76 year old male with abdominal distention. Initial encounter. EXAM: DG ABDOMEN ACUTE W/ 1V CHEST COMPARISON:  CT Abdomen and Pelvis 06/24/2014 and earlier. FINDINGS: Chronic cardiomegaly appears not significantly changed since 2011. However, there is abnormal increased left hilar and retrocardiac density. No pneumothorax or pleural effusion. Crowding of lung markings elsewhere. No edema suspected. Large body habitus. No pneumoperitoneum. Supine and left-side-down lateral decubitus views of the abdomen are provided. Mild to moderate gaseous distension of the stomach and throughout  the large bowel. Several gas-filled but nondilated small bowel loops are noted. Paucity of gas in the rectum. Furthermore there is a hazy gray opacity throughout the abdomen. This can be seen with ascites. No acute or suspicious osseous lesion identified. IMPRESSION: 1. Abnormal increased left hilar and retrocardiac density in the chest.  PA and lateral chest radiographs would be helpful when possible. Failing that, Chest CT (IV contrast preferred) would be recommended. 2. Pearline Cables hazy opacity throughout the abdomen suggesting Ascites. 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 3. Gas distended stomach and large bowel. Paucity of gas in the distal sigmoid colon and rectum. Small bowel loops nondilated at this time. Differential considerations include ileus and distal colon obstruction. Note that the prostate was severely enlarged on the 2016 CT Abdomen and Pelvis. Electronically Signed   By: Genevie Ann M.D.   On: 01/17/2016 12:03   Dg Abd Portable 1v  Result Date: 01/24/2016 CLINICAL DATA:  Ileus EXAM: PORTABLE ABDOMEN - 1 VIEW COMPARISON:  01/27/2016 FINDINGS: Scattered large and small bowel gas is noted. Gas distension of the colon is again identified and stable from the prior exam. The overall appearance is similar to that seen on the previous day. No free air is noted. Foley catheter is noted. IMPRESSION: Changes consistent with a colonic ileus. Correlation with physical exam is recommended. No significant change from the previous day is seen. Electronically Signed   By: Inez Catalina M.D.   On: 01/24/2016 07:16    Procedures Procedures (including critical care time) CRITICAL CARE Performed by: Charlesetta Shanks   Total critical care time: 60 minutes  Critical care time was exclusive of separately billable procedures and treating other patients.  Critical care was necessary to treat or prevent imminent or life-threatening deterioration.  Critical care was time spent personally by me on the following activities: development of treatment plan with patient and/or surrogate as well as nursing, discussions with consultants, evaluation of patient's response to treatment, examination of patient, obtaining history from patient or surrogate, ordering and performing treatments and interventions, ordering and  review of laboratory studies, ordering and review of radiographic studies, pulse oximetry and re-evaluation of patient's condition. Foley catheter placement (14:00) Due to difficulty of placement of Foley catheter of attempted placement of a coud catheter. Sterile prep and drape performed. Resistance was met. Unable to obtain return of urine. Ultrasound confirmed bladder is full without Foley catheter in place. Urology consulted. Medications Ordered in ED Medications  0.9 %  sodium chloride infusion ( Intravenous Rate/Dose Verify 01/24/16 0400)  piperacillin-tazobactam (ZOSYN) IVPB 2.25 g (2.25 g Intravenous Given 01/24/16 0406)  acetaminophen (TYLENOL) tablet 650 mg (not administered)    Or  acetaminophen (TYLENOL) suppository 650 mg (not administered)  senna-docusate (Senokot-S) tablet 1 tablet (not administered)  lidocaine (XYLOCAINE) 2 % jelly 1 application (not administered)  Glycerin (Adult) 2.1 g suppository 1 suppository (not administered)  vancomycin (VANCOCIN) 1,500 mg in sodium chloride 0.9 % 500 mL IVPB (not administered)  dextrose 50 % solution 50 mL (50 mLs Intravenous Given 02/03/2016 1050)  sodium chloride 0.9 % bolus 1,000 mL (0 mLs Intravenous Stopped 01/31/2016 1610)  piperacillin-tazobactam (ZOSYN) IVPB 3.375 g (0 g Intravenous Stopped 01/12/2016 1508)  dextrose 50 % solution 25 mL (25 mLs Intravenous Given 01/24/16 0816)     Initial Impression / Assessment and Plan / ED Course  I have reviewed the triage vital signs and the nursing notes.  Pertinent labs & imaging results that  were available during my care of the patient were reviewed by me and considered in my medical decision making (see chart for details).  Clinical Course  consult (14:12) discussed with Dr.Ramaswamy. We reviewed the patient's history present illness, diagnostic results and physical examination. At this time, he advises patients diagnostic results may be multifactorial. The patient has renal failure but  also significant dehydration and urinary obstruction. He advises an additional liter of fluids at this time. Patient has had 1 L already. Consultation has been placed to urology to assist in managing urinary retention. He suggests the patient's troponin elevation may very well be secondary to renal injury and to cycle troponins and treat dehydration. Other consideration is for possible sepsis versus volume depletion as etiology for patient's elevated lactic acid. There are positive findings on chest x-ray and acute abdominal series however some of these were pre-existing on old studies. At this time we will proceed with empiric treatment for sepsis etiology and plan for admission to stepdown unit with close monitoring and may change to ICU if respiratory compromise develops.  Final Clinical Impressions(s) / ED Diagnoses   Final diagnoses:  Sepsis ([place])  Hypothermia, initial encounter  Urinary retention  AKI (acute kidney injury) ([place])  Hypoglycemia    New Prescriptions Current Discharge Medication List       Charlesetta Shanks, MD 01/24/16 1057

## 2016-01-23 NOTE — ED Notes (Addendum)
Bladder Scan completed:   >994 mls found, RN aware

## 2016-01-23 NOTE — Progress Notes (Signed)
Pharmacy Antibiotic Note  Vincent HatchetJohn W Gibson is a 75 y.o. male admitted on 01/10/2016 with sepsis and cellulitis.  He reports that while taking a bath on 10/17 he became weak and was unable to get out of the bathtub for 3 days; he was found by friends on 10/20 and transported to ED via EMS.  PMH includes CHF, lower extremity swelling, and chronic leg wounds.  Pharmacy has been consulted for Vancomycin and Zosyn dosing.  SCr 3.32 (recently SCr 1.54 on 11/21/15) CrCl ~ 19.5 ml/min normalized (previous weight 121 kg) WBC 13.3 Hypothermic, 54F  Plan: Zosyn 3.375g IV x1 dose, then 2.25g IV q6h Vancomycin 1500 mg IV q48h. Measure Vanc trough at steady state. Follow up renal fxn, culture results, and clinical course.     Temp (24hrs), Avg:96 F (35.6 C), Min:96 F (35.6 C), Max:96 F (35.6 C)   Recent Labs Lab 01/06/2016 1100 01/24/2016 1129  WBC 13.3*  --   CREATININE 3.32*  --   LATICACIDVEN  --  4.63*    CrCl cannot be calculated (Unknown ideal weight.).    No Known Allergies  Antimicrobials this admission: 10/20 Vancomycin >>  10/20 Zosyn >>   Dose adjustments this admission:  Microbiology results: 10/20 BCx: sent  Thank you for allowing pharmacy to be a part of this patient's care.  Lynann Beaverhristine Nancee Brownrigg PharmD, BCPS Pager [phone] 02/01/2016 2:45 PM

## 2016-01-23 NOTE — ED Notes (Signed)
Unable to insert temp foley, MD made aware.

## 2016-01-23 NOTE — ED Notes (Signed)
Unsuccessful In and Out catheter attempt x 1.  Kaitlin RN assisted.  EDP made aware.

## 2016-01-23 NOTE — Consult Note (Signed)
Urology Consult  Referring physician: Calla Kicks Reason for referral: Retention  Chief Complaint: Retention  History of Present Illness: Elderly male with sepsis; unable to pass a catheter; medical co-morbidities; being treated for sepsis; iv antibiotics started; elevated Cr;   No GU surgery; no UTI; flow ok; nocturia minimal  Modifying factors: There are no other modifying factors  Associated signs and symptoms: There are no other associated signs and symptoms Aggravating and relieving factors: There are no other aggravating or relieving factors Severity: Moderate Duration: Persistent  Past Medical History:  Diagnosis Date  . A-fib ([place])   . CHF (congestive heart failure) ([place])   . Enlarged heart   . Hyperlipemia   . Hypertension    History reviewed. No pertinent surgical history.  Medications: I have reviewed the patient's current medications. Allergies: No Known Allergies  History reviewed. No pertinent family history. Social History:  reports that he has never smoked. He has never used smokeless tobacco. He reports that he does not drink alcohol or use drugs.  ROS: All systems are reviewed and negative except as noted. 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  Laboratory Data:  Results for orders placed or performed during the hospital encounter of 01/22/2016 (from the past 72 hour(s))  CBG monitoring, ED     Status: Abnormal   Collection Time: 01/26/2016 10:48 AM  Result Value Ref Range   Glucose-Capillary 26 (LL) 65 - 99 mg/dL   Comment 1 Notify RN   Comprehensive metabolic panel     Status: Abnormal   Collection Time: 01/05/2016 11:00 AM  Result Value Ref Range   Sodium 143 135 - 145 mmol/L   Potassium 4.3 3.5 - 5.1 mmol/L   Chloride 105 101 - 111 mmol/L   CO2 21 (L) 22 - 32 mmol/L   Glucose, Bld 90 65 - 99 mg/dL   BUN 86 (H) 6 - 20 mg/dL   Creatinine, Ser 3.32 (H) 0.61 - 1.24 mg/dL   Calcium 8.9 8.9 - 10.3 mg/dL   Total Protein 7.4 6.5 - 8.1 g/dL   Albumin 3.1 (L) 3.5 - 5.0 g/dL   AST 65 (H) 15 - 41 U/L   ALT 29 17 - 63 U/L   Alkaline Phosphatase 94 38 - 126 U/L   Total Bilirubin 4.7 (H) 0.3 - 1.2 mg/dL   GFR calc non Af Amer 17 (L) >60 mL/min   GFR calc Af Amer 19 (L) >60 mL/min    Comment: (NOTE) The eGFR has been calculated using the CKD EPI equation. This calculation has not been validated in all clinical situations. eGFR's persistently <60 mL/min signify possible Chronic Kidney Disease.    Anion gap 17 (H) 5 - 15  Brain natriuretic peptide     Status: Abnormal   Collection Time: 01/26/2016 11:00 AM  Result Value Ref Range   B Natriuretic Peptide 2,155.4 (H) 0.0 - 100.0 pg/mL  Troponin I     Status: Abnormal   Collection Time: 01/08/2016 11:00 AM  Result  Value Ref Range   Troponin I 0.36 (HH) <0.03 ng/mL    Comment: REPEATED TO VERIFY CRITICAL RESULT CALLED TO, READ BACK BY AND VERIFIED WITH: ROSSER,M. AT 13:03 01/24/2016 BY THOMPSON,N.   CBC with Differential     Status: Abnormal   Collection Time: 01/07/2016 11:00 AM  Result Value Ref Range   WBC 13.3 (H) 4.0 - 10.5 K/uL   RBC 3.74 (L) 4.22 - 5.81 MIL/uL   Hemoglobin 11.9 (L) 13.0 - 17.0 g/dL   HCT 34.8 (L) 39.0 - 52.0 %   MCV 93.0 78.0 - 100.0 fL   MCH 31.8 26.0 - 34.0 pg   MCHC 34.2 30.0 - 36.0 g/dL   RDW 15.9 (H) 11.5 - 15.5 %   Platelets 205 150 - 400 K/uL   Neutrophils Relative % 93 %   Neutro Abs 12.3 (H) 1.7 - 7.7 K/uL    Lymphocytes Relative 2 %   Lymphs Abs 0.3 (L) 0.7 - 4.0 K/uL   Monocytes Relative 5 %   Monocytes Absolute 0.7 0.1 - 1.0 K/uL   Eosinophils Relative 0 %   Eosinophils Absolute 0.0 0.0 - 0.7 K/uL   Basophils Relative 0 %   Basophils Absolute 0.0 0.0 - 0.1 K/uL  Protime-INR     Status: Abnormal   Collection Time: 01/19/2016 11:00 AM  Result Value Ref Range   Prothrombin Time 61.0 (H) 11.4 - 15.2 seconds   INR 6.77 (HH)     Comment: REPEATED TO VERIFY CRITICAL RESULT CALLED TO, READ BACK BY AND VERIFIED WITH: ROSSER,M. RN _0  ON 10.20.17 BY MCCOY,N SPECIMEN CHECKED FOR CLOTS   CK     Status: Abnormal   Collection Time: 01/07/2016 11:00 AM  Result Value Ref Range   Total CK 528 (H) 49 - 397 U/L  Magnesium     Status: None   Collection Time: 01/27/2016 11:00 AM  Result Value Ref Range   Magnesium 2.4 1.7 - 2.4 mg/dL  I-Stat CG4 Lactic Acid, ED     Status: Abnormal   Collection Time: 01/13/2016 11:29 AM  Result Value Ref Range   Lactic Acid, Venous 4.63 (HH) 0.5 - 1.9 mmol/L   Comment NOTIFIED PHYSICIAN   Blood gas, venous     Status: Abnormal   Collection Time: 02/01/2016 12:25 PM  Result Value Ref Range   pH, Ven 7.346 7.250 - 7.430   pCO2, Ven 37.8 (L) 44.0 - 60.0 mmHg   pO2, Ven 41.9 32.0 - 45.0 mmHg   Bicarbonate 20.1 20.0 - 28.0 mmol/L   Acid-base deficit 4.5 (H) 0.0 - 2.0 mmol/L   O2 Saturation 69.5 %   Patient temperature 98.6    Collection site VEIN    Drawn by COLLECTED BY LABORATORY    Sample type VEIN   CBG monitoring, ED     Status: Abnormal   Collection Time: 01/16/2016 12:56 PM  Result Value Ref Range   Glucose-Capillary 102 (H) 65 - 99 mg/dL  APTT     Status: Abnormal   Collection Time: 01/15/2016  2:53 PM  Result Value Ref Range   aPTT 56 (H) 24 - 36 seconds    Comment:        IF BASELINE aPTT IS ELEVATED, SUGGEST PATIENT RISK ASSESSMENT BE USED TO DETERMINE APPROPRIATE ANTICOAGULANT THERAPY.    No results found for this or any previous visit (from the past  240 hour(s)). Creatinine:  Recent Labs  01/12/2016 1100  CREATININE 3.32*    Xrays: See report/chart none  Impression/Assessment:  Retention  Plan:  With effective retraction we put in 16 Fr coude Leave in for trial of voiding before d/c home Call urology if any issues with voiding trial Send urine for c/s- ordered  , A 01/26/2016, 3:36 PM

## 2016-01-23 NOTE — ED Notes (Signed)
Bed: RESB Expected date:  Expected time:  Means of arrival:  Comments: EMS- 75yo M, "stuck in bathtub x 3 days"

## 2016-01-23 NOTE — Progress Notes (Signed)
Per chart review, patient is coming from home.  EDCM went to speak to patient at bedside.  However, patient was sent to the unit.

## 2016-01-23 NOTE — H&P (Addendum)
History and Physical    Vincent HatchetJohn W Witters ZOX:[id number] DOB: 03-07-1941 DOA: 03/13/16    PCP: Georgianne FickAMACHANDRAN,AJITH, MD  Patient coming from: home  Chief Complaint: fell in bathtub  HPI: Vincent Gibson is a 75 y.o. male with medical history significant of hypertension, hyperlipidemia, atrial fibrillation, systolic heart failure, EF of 81-19%45-50%, moderate pulmonary hypertension and mild right heart failure (ECHO on 9/17) He fell in the bathtub 3 days ago and has not been able to get out. His church friend found him today. He has not urinated since the day he fell. He has not had anything to eat or drink. He is alert and oriented and has no complaints at this time.   ED Course: Bicarbonate 21, BUN 86, creatinine 3.32, BNP 2155, CK total 528 troponin 0.36, CBC count 13.3, hematocrit 11.9, INR 6.77 Temperature 96.3 rectally Heart rate in the low 100s Pulse ox 99% on 2 L- 90% on room air Glucose 26 Acute abdomen with chest x-ray: Abnormal increased left hilar and retrocardiac density in the chest, gray opacity to the abdomen suggesting ascites, gaseous distention of stomach and large bowel with positive gas and sigmoid and rectum small bowel loops nondilated-question E recent distal colon obstruction  Review of Systems:  All other systems reviewed and apart from HPI, are negative.  Past Medical History:  Diagnosis Date  . A-fib ([facility])   . CHF (congestive heart failure) ([facility])   . Enlarged heart   . Hyperlipemia   . Hypertension     History reviewed. No pertinent surgical history.  Social History:   reports that he has never smoked. He has never used smokeless tobacco. He reports that he does not drink alcohol or use drugs.  No Known Allergies  History reviewed. No pertinent family history.   Prior to Admission medications   Medication Sig Start Date End Date Taking? Authorizing Provider  atorvastatin (LIPITOR) 40 MG tablet Take 40 mg by mouth daily as needed (high cholesterol).   Yes  Historical Provider, MD  furosemide (LASIX) 40 MG tablet Take 40 mg by mouth daily as needed for fluid or edema.   Yes Historical Provider, MD  irbesartan (AVAPRO) 300 MG tablet Take 300 mg by mouth daily.  10/14/15  Yes Historical Provider, MD  metoprolol succinate (TOPROL-XL) 50 MG 24 hr tablet Take 50 mg by mouth daily.  09/18/15  Yes Historical Provider, MD  warfarin (COUMADIN) 5 MG tablet Take 2.5 mg by mouth daily.  10/17/15  Yes Historical Provider, MD  cephALEXin (KEFLEX) 500 MG capsule Take 1 capsule (500 mg total) by mouth 2 (two) times daily. Patient not taking: Reported on 03/13/16 11/21/15   Pricilla LovelessScott Goldston, MD    Physical Exam: Vitals:   July 26, 2015 1332 July 26, 2015 1400 July 26, 2015 1415 July 26, 2015 1430  BP: 94/59 109/67 (!) 108/47 107/57  Pulse: 88 110 98 101  Resp: 14 12 (!) 9 16  Temp:      TempSrc:      SpO2: 97% 99% 97% 97%      Constitutional: NAD, calm, comfortable Eyes: PERTLA, lids and conjunctivae normal ENMT: Mucous membranes are dry Posterior pharynx clear of any exudate or lesions. Normal dentition.  Neck: normal, supple, no masses, no thyromegaly Respiratory: clear to auscultation bilaterally, no wheezing, no crackles. Normal respiratory effort. No accessory muscle use.  Cardiovascular: S1 & S2 heard, regular rate and rhythm, no murmurs / rubs / gallops. No extremity edema. No carotid bruits.  Abdomen: distended, tympanic, BS +, no tenderness, no masses palpated.  No hepatosplenomegaly. Bowel sounds normal.  Musculoskeletal: no clubbing / cyanosis. No joint deformity upper and lower extremities. Good ROM, no contractures. Normal muscle tone. Right arm and hand swollen Skin:  Right leg swollen, erythematous, tender- discolored and hyperkeratotic skin in legs, no other rashes, lesions, ulcers. No induration Neurologic: CN 2-12 grossly intact. Sensation intact, DTR normal. Strength 5/5 in all 4 limbs.  Psychiatric: Normal judgment and insight. Alert and oriented x 3. Normal  mood.     Labs on Admission: I have personally reviewed following labs and imaging studies  CBC:  Recent Labs Lab 02/01/2016 1100  WBC 13.3*  NEUTROABS 12.3*  HGB 11.9*  HCT 34.8*  MCV 93.0  PLT 205   Basic Metabolic Panel:  Recent Labs Lab 01/17/2016 1100  NA 143  K 4.3  CL 105  CO2 21*  GLUCOSE 90  BUN 86*  CREATININE 3.32*  CALCIUM 8.9   GFR: CrCl cannot be calculated (Unknown ideal weight.). Liver Function Tests:  Recent Labs Lab 01/05/2016 1100  AST 65*  ALT 29  ALKPHOS 94  BILITOT 4.7*  PROT 7.4  ALBUMIN 3.1*   No results for input(s): LIPASE, AMYLASE in the last 168 hours. No results for input(s): AMMONIA in the last 168 hours. Coagulation Profile:  Recent Labs Lab 01/22/2016 1100  INR 6.77*   Cardiac Enzymes:  Recent Labs Lab 01/26/2016 1100  CKTOTAL 528*  TROPONINI 0.36*   BNP (last 3 results) No results for input(s): PROBNP in the last 8760 hours. HbA1C: No results for input(s): HGBA1C in the last 72 hours. CBG:  Recent Labs Lab 01/06/2016 1048 01/28/2016 1256  GLUCAP 26* 102*   Lipid Profile: No results for input(s): CHOL, HDL, LDLCALC, TRIG, CHOLHDL, LDLDIRECT in the last 72 hours. Thyroid Function Tests: No results for input(s): TSH, T4TOTAL, FREET4, T3FREE, THYROIDAB in the last 72 hours. Anemia Panel: No results for input(s): VITAMINB12, FOLATE, FERRITIN, TIBC, IRON, RETICCTPCT in the last 72 hours. Urine analysis: No results found for: COLORURINE, APPEARANCEUR, LABSPEC, PHURINE, GLUCOSEU, HGBUR, BILIRUBINUR, KETONESUR, PROTEINUR, UROBILINOGEN, NITRITE, LEUKOCYTESUR Sepsis Labs: @LABRCNTIP (procalcitonin:4,lacticidven:4) )No results found for this or any previous visit (from the past 240 hour(s)).   Radiological Exams on Admission: Dg Abd Acute W/chest  Result Date: 01/29/2016 CLINICAL DATA:  75 year old male with abdominal distention. Initial encounter. EXAM: DG ABDOMEN ACUTE W/ 1V CHEST COMPARISON:  CT Abdomen and Pelvis  06/24/2014 and earlier. FINDINGS: Chronic cardiomegaly appears not significantly changed since 2011. However, there is abnormal increased left hilar and retrocardiac density. No pneumothorax or pleural effusion. Crowding of lung markings elsewhere. No edema suspected. Large body habitus. No pneumoperitoneum. Supine and left-side-down lateral decubitus views of the abdomen are provided. Mild to moderate gaseous distension of the stomach and throughout the large bowel. Several gas-filled but nondilated small bowel loops are noted. Paucity of gas in the rectum. Furthermore there is a hazy gray opacity throughout the abdomen. This can be seen with ascites. No acute or suspicious osseous lesion identified. IMPRESSION: 1. Abnormal increased left hilar and retrocardiac density in the chest. PA and lateral chest radiographs would be helpful when possible. Failing that, Chest CT (IV contrast preferred) would be recommended. 2. Wallace Cullens hazy opacity throughout the abdomen suggesting Ascites. 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 3. Gas distended stomach and large bowel. Paucity of gas in the distal sigmoid colon and rectum. Small bowel loops nondilated at this time. Differential considerations include ileus and distal colon obstruction. Note that the  prostate was severely enlarged on the 2016 CT Abdomen and Pelvis. Electronically Signed   By: Odessa Fleming M.D.   On: 01/05/2016 12:03    EKG: Independently reviewed. A. fib at 108 bpm, QTC 537, right bundle-branch block  Assessment/Plan Principal Problem:   AKI (acute kidney injury)  - due to U retention and poor oral intake for 3 days- also on Lasix and Avapro which I will hold - check U sodium/ Cr - has received 1 L NS in the ER- will give NS at 75 cc/hr as he will be NPO  Active Problems:   Urinary retention - ER has contacted Urology to place foley  Cellulitis right leg- sepsis, hypothermia, tachycardia, leukocytosis, lactic  acidosis - both legs are swollen but right leg is more swollen, erythematous and tender - f/u UA and blood cx - cont Vanc, Zosyn  - will get Venous duplex    Mild hypoxia/ Chronic systolic heart failure  - changes on CXR appear to be chronic- as there is not pulm edema, will cont IVF  - hold ACE I and Lasix  Mild rhabdomyolysis - due to laying in bathtub - cont IVF  Hypoglycemia - resolved with D 50    Prolonged Q-T interval on ECG - follow on telemetry- check Mg  Ileus? - abdomen distended, tympanic with poor bowel sounds - NPO- f/u xray in AM  Atrial fibrillation/ supra therapeutic INR - cont Metoprolol- hold coumadin  Hypotension - hold Avapro, Lasix  HLD - Lipitor  right arm edema -  he states he was not lying on the arm as far as he could tell and was able to move his upper body  - will obtain venous duplex       DVT prophylaxis: Heparin Code Status: DNR  Family Communication:   Disposition Plan: Admit to CDU Consults called: Urology  Admission status: Admission   [facility] MD [facility] Pager: [email or web address] Password [email or web address] 7PM-7AM, please contact night-coverage   01/09/2016, 2:59 PM

## 2016-01-24 ENCOUNTER — Inpatient Hospital Stay (HOSPITAL_COMMUNITY): Payer: Medicare Other

## 2016-01-24 ENCOUNTER — Encounter (HOSPITAL_COMMUNITY): Payer: Self-pay | Admitting: Family Medicine

## 2016-01-24 DIAGNOSIS — R609 Edema, unspecified: Secondary | ICD-10-CM

## 2016-01-24 DIAGNOSIS — L8990 Pressure ulcer of unspecified site, unspecified stage: Secondary | ICD-10-CM | POA: Diagnosis present

## 2016-01-24 LAB — BLOOD CULTURE ID PANEL (REFLEXED)
ACINETOBACTER BAUMANNII: NOT DETECTED
CANDIDA ALBICANS: NOT DETECTED
CANDIDA GLABRATA: NOT DETECTED
CANDIDA TROPICALIS: NOT DETECTED
Candida krusei: NOT DETECTED
Candida parapsilosis: NOT DETECTED
ENTEROBACTER CLOACAE COMPLEX: NOT DETECTED
ENTEROBACTERIACEAE SPECIES: NOT DETECTED
ENTEROCOCCUS SPECIES: NOT DETECTED
Escherichia coli: NOT DETECTED
Haemophilus influenzae: NOT DETECTED
KLEBSIELLA OXYTOCA: NOT DETECTED
KLEBSIELLA PNEUMONIAE: NOT DETECTED
LISTERIA MONOCYTOGENES: NOT DETECTED
NEISSERIA MENINGITIDIS: NOT DETECTED
Proteus species: NOT DETECTED
Pseudomonas aeruginosa: NOT DETECTED
STREPTOCOCCUS AGALACTIAE: NOT DETECTED
STREPTOCOCCUS PYOGENES: NOT DETECTED
Serratia marcescens: NOT DETECTED
Staphylococcus aureus (BCID): NOT DETECTED
Staphylococcus species: NOT DETECTED
Streptococcus pneumoniae: NOT DETECTED
Streptococcus species: NOT DETECTED

## 2016-01-24 LAB — URINE CULTURE: Culture: NO GROWTH

## 2016-01-24 LAB — GLUCOSE, CAPILLARY
GLUCOSE-CAPILLARY: 66 mg/dL (ref 65–99)
GLUCOSE-CAPILLARY: 100 mg/dL — AB (ref 65–99)
Glucose-Capillary: 89 mg/dL (ref 65–99)
Glucose-Capillary: 98 mg/dL (ref 65–99)
Glucose-Capillary: 116 mg/dL — ABNORMAL HIGH (ref 65–99)
Glucose-Capillary: 114 mg/dL — ABNORMAL HIGH (ref 65–99)

## 2016-01-24 LAB — BASIC METABOLIC PANEL
Anion gap: 10 (ref 5–15)
BUN: 87 mg/dL — AB (ref 6–20)
CHLORIDE: 110 mmol/L (ref 101–111)
CO2: 23 mmol/L (ref 22–32)
CREATININE: 3.3 mg/dL — AB (ref 0.61–1.24)
Calcium: 7.9 mg/dL — ABNORMAL LOW (ref 8.9–10.3)
GFR calc Af Amer: 20 mL/min — ABNORMAL LOW (ref >60)
GFR calc non Af Amer: 17 mL/min — ABNORMAL LOW (ref >60)
GLUCOSE: 113 mg/dL — AB (ref 65–99)
POTASSIUM: 3.5 mmol/L (ref 3.5–5.1)
SODIUM: 143 mmol/L (ref 135–145)

## 2016-01-24 LAB — CBC
HEMATOCRIT: 32.2 % — AB (ref 39.0–52.0)
Hemoglobin: 11 g/dL — ABNORMAL LOW (ref 13.0–17.0)
MCH: 31.9 pg (ref 26.0–34.0)
MCHC: 34.2 g/dL (ref 30.0–36.0)
MCV: 93.3 fL (ref 78.0–100.0)
PLATELETS: 150 10*3/uL (ref 150–400)
RBC: 3.45 MIL/uL — ABNORMAL LOW (ref 4.22–5.81)
RDW: 15.9 % — AB (ref 11.5–15.5)
WBC: 15.5 10*3/uL — AB (ref 4.0–10.5)

## 2016-01-24 LAB — PROTIME-INR
INR: 10.23
Prothrombin Time: 84.8 seconds — ABNORMAL HIGH (ref 11.4–15.2)

## 2016-01-24 LAB — LACTIC ACID, PLASMA
Lactic Acid, Venous: 2.2 mmol/L (ref 0.5–1.9)
Lactic Acid, Venous: 2 mmol/L (ref 0.5–1.9)

## 2016-01-24 LAB — TROPONIN I: TROPONIN I: 0.25 ng/mL — AB (ref <0.03)

## 2016-01-24 MED ORDER — GLYCERIN (LAXATIVE) 2.1 G RE SUPP
1.0000 | RECTAL | Status: DC | PRN
  Administered 2016-01-24: 1 via RECTAL
  Filled 2016-01-24 (×2): qty 1

## 2016-01-24 MED ORDER — ATORVASTATIN CALCIUM 40 MG PO TABS
40.0000 mg | ORAL_TABLET | Freq: Every day | ORAL | Status: DC
  Administered 2016-01-24 – 2016-02-07 (×13): 40 mg via ORAL
  Filled 2016-01-24 (×13): qty 1

## 2016-01-24 MED ORDER — DEXTROSE 50 % IV SOLN
25.0000 mL | Freq: Once | INTRAVENOUS | Status: AC
  Administered 2016-01-24: 25 mL via INTRAVENOUS
  Filled 2016-01-24: qty 50

## 2016-01-24 MED ORDER — IOPAMIDOL (ISOVUE-300) INJECTION 61%
30.0000 mL | Freq: Once | INTRAVENOUS | Status: AC | PRN
  Administered 2016-01-24: 30 mL via ORAL

## 2016-01-24 MED ORDER — PANTOPRAZOLE SODIUM 40 MG PO TBEC
40.0000 mg | DELAYED_RELEASE_TABLET | Freq: Once | ORAL | Status: AC
Start: 2016-01-24 — End: 2016-01-24
  Administered 2016-01-24: 40 mg via ORAL
  Filled 2016-01-24: qty 1

## 2016-01-24 MED ORDER — PHYTONADIONE 5 MG PO TABS
5.0000 mg | ORAL_TABLET | Freq: Once | ORAL | Status: AC
  Administered 2016-01-24: 5 mg via ORAL
  Filled 2016-01-24: qty 1

## 2016-01-24 MED ORDER — VANCOMYCIN HCL 10 G IV SOLR
1500.0000 mg | INTRAVENOUS | Status: DC
  Administered 2016-01-24: 1500 mg via INTRAVENOUS
  Filled 2016-01-24: qty 1500

## 2016-01-24 MED ORDER — METOPROLOL SUCCINATE ER 25 MG PO TB24
50.0000 mg | ORAL_TABLET | Freq: Every day | ORAL | Status: DC
  Administered 2016-01-24: 50 mg via ORAL
  Filled 2016-01-24: qty 2

## 2016-01-24 NOTE — Progress Notes (Signed)
PHARMACY - PHYSICIAN COMMUNICATION CRITICAL VALUE ALERT - BLOOD CULTURE IDENTIFICATION (BCID)  Results for orders placed or performed during the hospital encounter of 2016-02-19  Blood Culture ID Panel (Reflexed) (Collected: 07-20-2015 12:48 PM)  Result Value Ref Range   Enterococcus species NOT DETECTED NOT DETECTED   Listeria monocytogenes NOT DETECTED NOT DETECTED   Staphylococcus species NOT DETECTED NOT DETECTED   Staphylococcus aureus NOT DETECTED NOT DETECTED   Streptococcus species NOT DETECTED NOT DETECTED   Streptococcus agalactiae NOT DETECTED NOT DETECTED   Streptococcus pneumoniae NOT DETECTED NOT DETECTED   Streptococcus pyogenes NOT DETECTED NOT DETECTED   Acinetobacter baumannii NOT DETECTED NOT DETECTED   Enterobacteriaceae species NOT DETECTED NOT DETECTED   Enterobacter cloacae complex NOT DETECTED NOT DETECTED   Escherichia coli NOT DETECTED NOT DETECTED   Klebsiella oxytoca NOT DETECTED NOT DETECTED   Klebsiella pneumoniae NOT DETECTED NOT DETECTED   Proteus species NOT DETECTED NOT DETECTED   Serratia marcescens NOT DETECTED NOT DETECTED   Haemophilus influenzae NOT DETECTED NOT DETECTED   Neisseria meningitidis NOT DETECTED NOT DETECTED   Pseudomonas aeruginosa NOT DETECTED NOT DETECTED   Candida albicans NOT DETECTED NOT DETECTED   Candida glabrata NOT DETECTED NOT DETECTED   Candida krusei NOT DETECTED NOT DETECTED   Candida parapsilosis NOT DETECTED NOT DETECTED   Candida tropicalis NOT DETECTED NOT DETECTED    Name of physician (or Provider) Contacted: Dr. Jarvis NewcomerGrunz  Changes to prescribed antibiotics required: continue with current regimen that includes zosyn   Juliette Alcideustin Cherise Fedder, PharmD, BCPS.   Pager: [phone] 01/24/2016 8:17 AM

## 2016-01-24 NOTE — Progress Notes (Signed)
*  PRELIMINARY RESULTS* Vascular Ultrasound Right upper extremity venous duplex has been completed.  Preliminary findings: Technically difficult due to edema. No evidence of DVT or superficial thrombosis.  Right lower extremity venous duplex has been completed. Preliminary findings: Technically difficult due to edema. No evidence of DVT or baker's cyst.    Farrel DemarkJill Eunice, RDMS, RVT  01/24/2016, 8:58 AM

## 2016-01-24 NOTE — Progress Notes (Addendum)
PROGRESS NOTE  Vincent Gibson  WJX:[id number] DOB: [birth year] DOA: 01/31/2016 PCP: Georgianne Fick, MD  Brief Narrative: Vincent Gibson is a 75 y.o. 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 He stated he was too weak to get up and had not taken any po nor urinated or defecated during that time. On arrival he was alert and oriented, dry mucous membranes and had bilateral leg swelling R>L with evidence of cellulitis on the right leg. Abdomen was tympanitic and distended. Rectal temperature was 96.59F, glucose 26mg /dl, hypotensive and tachycardic saturating 90% on room air, 99% with 2L O2. Labs were significant for creatinine 3.32, BUN 86, bicarbonate 21, CK 528, troponin 0.36, BNP 2,155. Abdomen with chest x-ray showed abnormal increased left hilar and retrocardiac density in the chest, gray opacity to the abdomen suggesting ascites, gaseous distention of stomach and large bowel with positive gas in sigmoid and rectum, small bowel loops nondilated. INR on arrival was supratherapeutic at 6, jumped to 10 10/21 without evidence of bleeding.   Assessment & Plan: Principal Problem:   AKI (acute kidney injury) (HCC) Active Problems:   Urinary retention   Prolonged Q-T interval on ECG   Atrial fibrillation (HCC)   Chronic systolic heart failure (HCC)   Sepsis (HCC)   Cellulitis and abscess of right lower extremity   Pressure injury of skin  Acute renal failure: Cr 3.32, unknown baseline (only prior creatinine was 1.5). Multifactorial: pre-renal due to dehydration from no po intake x 3 days, intrarenal suggested by U Na < 10 and pt on ARB, and postrenal with urinary retention due to BPH.  - No IV contrast. - Hold ARB and lasix - Coude Foley placed by urology in ED. Monitor UOP.  - Receiving judicious IVF given systolic left and right heart failure.   Sepsis due to  cellulitis: Improving. Lactic acidosis resolved and trending downward (3.1 > 2.2) s/p 1L NS bolus and NS infusion overnight. Afebrile. WBC 13.3 > 15.5.  - GNR's noted in 1 of 2 blood cultures from 10/20 with rapid reflex panel negative. Will continue zosyn and monitor.  - Continue vancomycin and zosyn (10/20 >> ) - Monitor blood cultures and urine culture. - No evidence of DVT on initial venous doppler  Colonic ileus: Demonstrated on abd plain films.  - CT abd/pelvis w/ po contrast if tolerated to evaluate, r/o sigmoid volvulus.   - Glycerin suppository. - Avoid fleet enema in renal failure.  Acute respiratory failure: Due to sepsis, doubt acute left heart failure with evidence of intravascular volume depletion. Doubt pneumonia with no symptoms, but densities noted on CXR.   - CT chest to evaluate left hilar/retrocardiac densities on CXR.  - Oxygen prn for saturations to remain > 90%  Chronic systolic heart failure: EF 45-50%, mild LVH, on recent echo read by Dr. Elease Hashimoto, with moderate pulmonary HTN (PA peak ) and right ventricle mild-mod reduced function. LE edema and distended abdomen on exam, ?congestive hepatopathy with elevated INR and hypoalbuminemia. BNP 2155.  - Initial troponin elevated in setting of renal failure. ECG with IVCD, TWI's of unknown chronicity on admission ECG. Recheck ECG and troponin now. - Hold ARB and lasix  Mild rhabdomyolysis: From 3 days laying in bathtub.  - Continue IVF, monitor renal function.  - Recheck CK in AM  Supratherapeutic INR: 6 > 10 since admission due to coumadin,  renal failure and congestive hepatopathy, possibly. No active bleeding. - Vit K 5mg  po x1 given.  - Repeat INR in AM - Of course, hold coumadin  Hypoglycemia: On arrival resolved with D50. Glucose 113 on BMP 10/21.  - Monitor CBG  Prolonged QTc interval: - Telemetry - Monitor electrolytes. K and Mg wnl.  - Minimize provocative medications as able.  Urinary  retention: Severely enlarged prostate on CT in 2016.  - Foley placed by urology. Voiding trial eventually prior to discharge.   Atrial fibrillation: Rate controlled.  - Cont metoprolol - Hold coumadin  Hypotension: Chronic stable, hypotensive currently. - hold ARB, Lasix  Hyperlipidemia: Chronic, stable. - Continue lipitor  Right arm edema: Without evidence of cellulitis.  - Venous duplex without evidence of DVT or SVT on preliminary read  DVT prophylaxis: Supratherapeutic INR Code Status: DNR confirmed at admission Family Communication: None at bedside this AM.  Disposition Plan: Ongoing evaluation and management of sepsis in SDU for potentially life-threatening illness.   Consultants:   None  Procedures:   Foley placed by Dr. Perley Jain, urology 10/20  RLE venous doppler 10/20 negative preliminary read for DVT  RUE venous doppler 10/20 negative preliminary read for SVT or DVT  Antimicrobials:  Vancomycin (10/20 >> )  Zosyn (10/20 >> )   Subjective: Pt actually feels "good" this morning. Cannot open his eyes due to crusting. Feels very thirsty. Abdomen feels big but no pain. Denies chest pain, dyspnea.   Objective: Vitals:   01/24/16 0400 01/24/16 0500 01/24/16 0600 01/24/16 0800  BP: (!) 116/57 111/62 (!) 100/43   Pulse: 82 (!) 113 92   Resp: 16 (!) 21 (!) 21   Temp: 98.9 F (37.2 C)   97.7 F (36.5 C)  TempSrc: Axillary   Axillary  SpO2: 98% 94% 94%   Weight:      Height:        Intake/Output Summary (Last 24 hours) at 01/24/16 1055 Last data filed at 01/24/16 0406  Gross per 24 hour  Intake          1512.92 ml  Output             1155 ml  Net           357.92 ml   Filed Weights   02/22/16 1700  Weight: 125.6 kg (276 lb 14.4 oz)    Examination: General exam: 75 y.o. male in no distress HEENT: Significant crusting on bilateral eyelids sealing eyes shut. Eyes with full ROM and preserved gross vision once eyelids separated. Dry mucous  membranes. Respiratory system: Non-labored breathing with O2 by nasal cannula hovering 93-95%. Clear to auscultation anteriorly.  Cardiovascular system: Irregular HR 70-80s. No murmur, rub, or gallop. No JVD Gastrointestinal system: Abdomen protuberant, distended with hypoactive bowel sounds. Do not appreciate fluid wave. No tenderness. Central nervous system: Alert and oriented. No focal neurological deficits. Decreased strength diffusely, but still 5/5 x 4 extremities. Sensation intact. No pain with right ankle ROM.   Extremities: Warm. LEs with 3+ pitting edema and chronic stasis dermatitis. RLE with indistinct erythema anterolaterally above the ankle. No tracking noted. No drainage or fluctuance palpated. Skin: As above.  Psychiatry: Judgement and insight appear normal. Mood & affect appropriate.   Data Reviewed: I have personally reviewed following labs and imaging studies  CBC:  Recent Labs Lab 02-22-2016 1100 01/24/16 0946  WBC 13.3* 15.5*  NEUTROABS 12.3*  --   HGB 11.9* 11.0*  HCT 34.8* 32.2*  MCV 93.0 93.3  PLT 205 150   Basic Metabolic Panel:  Recent Labs Lab 01/24/2016 1100 01/24/16 0946  NA 143 143  K 4.3 3.5  CL 105 110  CO2 21* 23  GLUCOSE 90 113*  BUN 86* 87*  CREATININE 3.32* 3.30*  CALCIUM 8.9 7.9*  MG 2.4  --    GFR: Estimated Creatinine Clearance: 26.1 mL/min (by C-G formula based on SCr of 3.3 mg/dL (H)). Liver Function Tests:  Recent Labs Lab 01/09/2016 1100  AST 65*  ALT 29  ALKPHOS 94  BILITOT 4.7*  PROT 7.4  ALBUMIN 3.1*   No results for input(s): LIPASE, AMYLASE in the last 168 hours. No results for input(s): AMMONIA in the last 168 hours. Coagulation Profile:  Recent Labs Lab 01/18/2016 1100  INR 6.77*   Cardiac Enzymes:  Recent Labs Lab 01/18/2016 1100  CKTOTAL 528*  TROPONINI 0.36*   BNP (last 3 results) No results for input(s): PROBNP in the last 8760 hours. HbA1C: No results for input(s): HGBA1C in the last 72  hours. CBG:  Recent Labs Lab 02/02/2016 2104 01/22/2016 2323 01/24/16 0349 01/24/16 0727 01/24/16 0845  GLUCAP 89 84 89 66 98   Lipid Profile: No results for input(s): CHOL, HDL, LDLCALC, TRIG, CHOLHDL, LDLDIRECT in the last 72 hours. Thyroid Function Tests: No results for input(s): TSH, T4TOTAL, FREET4, T3FREE, THYROIDAB in the last 72 hours. Anemia Panel: No results for input(s): VITAMINB12, FOLATE, FERRITIN, TIBC, IRON, RETICCTPCT in the last 72 hours. Urine analysis:    Component Value Date/Time   COLORURINE AMBER (A) 01/07/2016 1507   APPEARANCEUR CLOUDY (A) 01/07/2016 1507   LABSPEC 1.018 01/29/2016 1507   PHURINE 5.5 01/25/2016 1507   GLUCOSEU NEGATIVE 01/05/2016 1507   HGBUR SMALL (A) 01/06/2016 1507   BILIRUBINUR SMALL (A) 01/09/2016 1507   KETONESUR NEGATIVE 01/19/2016 1507   PROTEINUR NEGATIVE 01/30/2016 1507   NITRITE NEGATIVE 01/25/2016 1507   LEUKOCYTESUR NEGATIVE 01/24/2016 1507   Sepsis Labs: @LABRCNTIP (procalcitonin:4,lacticidven:4)  ) Recent Results (from the past 240 hour(s))  Culture, blood (routine x 2)     Status: None (Preliminary result)   Collection Time: 01/31/2016 12:48 PM  Result Value Ref Range Status   Specimen Description BLOOD LEFT ANTECUBITAL  Final   Special Requests BOTTLES DRAWN AEROBIC AND ANAEROBIC 5 CC EA  Final   Culture  Setup Time   Final    GRAM NEGATIVE RODS ANAEROBIC BOTTLE ONLY Organism ID to follow CRITICAL RESULT CALLED TO, READ BACK BY AND VERIFIED WITH: Haze Boyden, PHARM AT 0813 ON [id number] BY Lucienne Capers    Culture   Final    TOO YOUNG TO READ Performed at [facility]    Report Status PENDING  Incomplete  Blood Culture ID Panel (Reflexed)     Status: None   Collection Time: 01/26/2016 12:48 PM  Result Value Ref Range Status   Enterococcus species NOT DETECTED NOT DETECTED Final   Listeria monocytogenes NOT DETECTED NOT DETECTED Final   Staphylococcus species NOT DETECTED NOT DETECTED Final   Staphylococcus  aureus NOT DETECTED NOT DETECTED Final   Streptococcus species NOT DETECTED NOT DETECTED Final   Streptococcus agalactiae NOT DETECTED NOT DETECTED Final   Streptococcus pneumoniae NOT DETECTED NOT DETECTED Final   Streptococcus pyogenes NOT DETECTED NOT DETECTED Final   Acinetobacter baumannii NOT DETECTED NOT DETECTED Final   Enterobacteriaceae species NOT DETECTED NOT DETECTED Final   Enterobacter cloacae complex NOT DETECTED NOT DETECTED Final   Escherichia coli NOT DETECTED NOT DETECTED Final  Klebsiella oxytoca NOT DETECTED NOT DETECTED Final   Klebsiella pneumoniae NOT DETECTED NOT DETECTED Final   Proteus species NOT DETECTED NOT DETECTED Final   Serratia marcescens NOT DETECTED NOT DETECTED Final   Haemophilus influenzae NOT DETECTED NOT DETECTED Final   Neisseria meningitidis NOT DETECTED NOT DETECTED Final   Pseudomonas aeruginosa NOT DETECTED NOT DETECTED Final   Candida albicans NOT DETECTED NOT DETECTED Final   Candida glabrata NOT DETECTED NOT DETECTED Final   Candida krusei NOT DETECTED NOT DETECTED Final   Candida parapsilosis NOT DETECTED NOT DETECTED Final   Candida tropicalis NOT DETECTED NOT DETECTED Final    Comment: Performed at [facility]  MRSA PCR Screening     Status: None   Collection Time: 01/11/2016  5:21 PM  Result Value Ref Range Status   MRSA by PCR NEGATIVE NEGATIVE Final    Comment:        The GeneXpert MRSA Assay (FDA approved for NASAL specimens only), is one component of a comprehensive MRSA colonization surveillance program. It is not intended to diagnose MRSA infection nor to guide or monitor treatment for MRSA infections.      Radiology Studies: Dg Abd Acute W/chest  Result Date: 01/07/2016 CLINICAL DATA:  75 year old male with abdominal distention. Initial encounter. EXAM: DG ABDOMEN ACUTE W/ 1V CHEST COMPARISON:  CT Abdomen and Pelvis 06/24/2014 and earlier. FINDINGS: Chronic cardiomegaly appears not significantly changed  since 2011. However, there is abnormal increased left hilar and retrocardiac density. No pneumothorax or pleural effusion. Crowding of lung markings elsewhere. No edema suspected. Large body habitus. No pneumoperitoneum. Supine and left-side-down lateral decubitus views of the abdomen are provided. Mild to moderate gaseous distension of the stomach and throughout the large bowel. Several gas-filled but nondilated small bowel loops are noted. Paucity of gas in the rectum. Furthermore there is a hazy gray opacity throughout the abdomen. This can be seen with ascites. No acute or suspicious osseous lesion identified. IMPRESSION: 1. Abnormal increased left hilar and retrocardiac density in the chest. PA and lateral chest radiographs would be helpful when possible. Failing that, Chest CT (IV contrast preferred) would be recommended. 2. Wallace Cullens hazy opacity throughout the abdomen suggesting Ascites. 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 3. Gas distended stomach and large bowel. Paucity of gas in the distal sigmoid colon and rectum. Small bowel loops nondilated at this time. Differential considerations include ileus and distal colon obstruction. Note that the prostate was severely enlarged on the 2016 CT Abdomen and Pelvis. Electronically Signed   By: Odessa Fleming M.D.   On: 01/05/2016 12:03   Dg Abd Portable 1v  Result Date: 01/24/2016 CLINICAL DATA:  Ileus EXAM: PORTABLE ABDOMEN - 1 VIEW COMPARISON:  02/03/2016 FINDINGS: Scattered large and small bowel gas is noted. Gas distension of the colon is again identified and stable from the prior exam. The overall appearance is similar to that seen on the previous day. No free air is noted. Foley catheter is noted. IMPRESSION: Changes consistent with a colonic ileus. Correlation with physical exam is recommended. No significant change from the previous day is seen. Electronically Signed   By: Alcide Clever M.D.   On: 01/24/2016 07:16     Scheduled Meds: . lidocaine  1 application Urethral Once  . piperacillin-tazobactam (ZOSYN)  IV  2.25 g Intravenous Q6H  . vancomycin  1,500 mg Intravenous Q48H   Continuous Infusions: . sodium chloride 75 mL/hr at 01/24/16 0400  LOS: 1 day   Time spent: 35 minutes.  Hazeline Junkeryan Amara Justen, MD [facility] Pager [phone]  If 7PM-7AM, please contact night-coverage [email or web address] Password [email or web address] 01/24/2016, 10:55 AM

## 2016-01-24 NOTE — Evaluation (Signed)
Clinical/Bedside Swallow Evaluation Patient Details  Name: Vincent HatchetJohn W Gibson MRN: [id number] Date of Birth: [birth year]  Today's Date: 01/24/2016 Time: SLP Start Time (ACUTE ONLY): 1555 SLP Stop Time (ACUTE ONLY): 1610 SLP Time Calculation (min) (ACUTE ONLY): 15 min  Past Medical History:  Past Medical History:  Diagnosis Date  . A-fib (HCC)   . CHF (congestive heart failure) (HCC)   . Enlarged heart   . Hyperlipemia   . Hypertension    Past Surgical History: History reviewed. No pertinent surgical history. HPI:  Patient is a 75 y.o. male with PMH: HTN, a-fib,systolic heart failure, hyperlipidemia who was admitted to hospital after spending 3 days in his bathtub after falling and not being able to get out. He was found by friend who had become worried after not hearing from him. During those 3 days, he had nothing to eat or drink.   Assessment / Plan / Recommendation Clinical Impression  Patient presents with a mild oropharyngeal dysphagia characterized by decreased mastication and oropharyngeal transit of regular texture solids, but no overt s/s of aspiraiton or penetration. Patient did state that he had some discomfort with regular texture solids, and SLP suspects due to dry mucosa in oropharynx from 3 days without water.    Aspiration Risk  Mild aspiration risk    Diet Recommendation Dysphagia 3 (Mech soft);Thin liquid   Liquid Administration via: Straw;Cup Medication Administration: Whole meds with liquid Supervision: Full supervision/cueing for compensatory strategies;Staff to assist with self feeding Compensations: Minimize environmental distractions;Slow rate;Small sips/bites Postural Changes: Seated upright at 90 degrees;Remain upright for at least 30 minutes after po intake    Other  Recommendations Oral Care Recommendations: Oral care BID   Follow up Recommendations None      Frequency and Duration min 1 x/week  1 week       Prognosis Prognosis for Safe Diet  Advancement: Good      Swallow Study   General Date of Onset: 2015/09/19 HPI: Patient is a 75 y.o. male with PMH: HTN, a-fib,systolic heart failure, hyperlipidemia who was admitted to hospital after spending 3 days in his bathtub after falling and not being able to get out. He was found by friend who had become worried after not hearing from him. During those 3 days, he had nothing to eat or drink. Type of Study: Bedside Swallow Evaluation Previous Swallow Assessment: N/A Diet Prior to this Study: NPO Temperature Spikes Noted: No Respiratory Status: Nasal cannula History of Recent Intubation: No Behavior/Cognition: Alert;Cooperative;Pleasant mood Oral Cavity Assessment: Dry Oral Care Completed by SLP: No Oral Cavity - Dentition: Adequate natural dentition Vision: Functional for self-feeding Self-Feeding Abilities: Needs assist;Needs set up Patient Positioning: Upright in bed Baseline Vocal Quality: Normal Volitional Cough: Strong Volitional Swallow: Able to elicit    Oral/Motor/Sensory Function     Ice Chips Ice chips: Not tested   Thin Liquid Thin Liquid: Within functional limits Presentation: Cup;Straw Other Comments: No overt s/s of aspiration    Nectar Thick     Honey Thick     Puree Puree: Within functional limits Presentation: Self Fed;Spoon Other Comments: No overt s/s of aspiration   Solid   GO   Solid: Impaired Oral Phase Functional Implications: Impaired mastication;Prolonged oral transit Pharyngeal Phase Impairments: Suspected delayed Swallow Other Comments: Patient stated some discomfort with transit of graham cracker       Angela NevinJohn T. Elyanna Wallick, MA, CCC-SLP 01/24/16 4:51 PM

## 2016-01-25 ENCOUNTER — Inpatient Hospital Stay (HOSPITAL_COMMUNITY): Payer: Medicare Other

## 2016-01-25 ENCOUNTER — Encounter (HOSPITAL_COMMUNITY): Payer: Self-pay | Admitting: Gastroenterology

## 2016-01-25 DIAGNOSIS — I481 Persistent atrial fibrillation: Secondary | ICD-10-CM

## 2016-01-25 DIAGNOSIS — R6521 Severe sepsis with septic shock: Secondary | ICD-10-CM

## 2016-01-25 DIAGNOSIS — L02415 Cutaneous abscess of right lower limb: Secondary | ICD-10-CM

## 2016-01-25 DIAGNOSIS — L03115 Cellulitis of right lower limb: Secondary | ICD-10-CM

## 2016-01-25 DIAGNOSIS — A419 Sepsis, unspecified organism: Secondary | ICD-10-CM

## 2016-01-25 DIAGNOSIS — I5022 Chronic systolic (congestive) heart failure: Secondary | ICD-10-CM

## 2016-01-25 DIAGNOSIS — N179 Acute kidney failure, unspecified: Secondary | ICD-10-CM

## 2016-01-25 LAB — COMPREHENSIVE METABOLIC PANEL
ALT: 16 U/L — AB (ref 17–63)
ANION GAP: 8 (ref 5–15)
AST: 25 U/L (ref 15–41)
Albumin: 1.4 g/dL — ABNORMAL LOW (ref 3.5–5.0)
Alkaline Phosphatase: 34 U/L — ABNORMAL LOW (ref 38–126)
BUN: 93 mg/dL — ABNORMAL HIGH (ref 6–20)
CHLORIDE: 113 mmol/L — AB (ref 101–111)
CO2: 24 mmol/L (ref 22–32)
CREATININE: 3.14 mg/dL — AB (ref 0.61–1.24)
Calcium: 6.9 mg/dL — ABNORMAL LOW (ref 8.9–10.3)
GFR, EST AFRICAN AMERICAN: 21 mL/min — AB (ref >60)
GFR, EST NON AFRICAN AMERICAN: 18 mL/min — AB (ref >60)
Glucose, Bld: 112 mg/dL — ABNORMAL HIGH (ref 65–99)
Potassium: 3.4 mmol/L — ABNORMAL LOW (ref 3.5–5.1)
Sodium: 145 mmol/L (ref 135–145)
Total Bilirubin: 1.7 mg/dL — ABNORMAL HIGH (ref 0.3–1.2)
Total Protein: 3.4 g/dL — ABNORMAL LOW (ref 6.5–8.1)

## 2016-01-25 LAB — CBC
HCT: 28.6 % — ABNORMAL LOW (ref 39.0–52.0)
HCT: 29.8 % — ABNORMAL LOW (ref 39.0–52.0)
HEMOGLOBIN: 10.2 g/dL — AB (ref 13.0–17.0)
Hemoglobin: 9.8 g/dL — ABNORMAL LOW (ref 13.0–17.0)
MCH: 31.6 pg (ref 26.0–34.0)
MCH: 32.2 pg (ref 26.0–34.0)
MCHC: 34.3 g/dL (ref 30.0–36.0)
MCHC: 34.2 g/dL (ref 30.0–36.0)
MCV: 92.3 fL (ref 78.0–100.0)
MCV: 94 fL (ref 78.0–100.0)
PLATELETS: 120 10*3/uL — AB (ref 150–400)
Platelets: 135 10*3/uL — ABNORMAL LOW (ref 150–400)
RBC: 3.1 MIL/uL — AB (ref 4.22–5.81)
RBC: 3.17 MIL/uL — AB (ref 4.22–5.81)
RDW: 16 % — ABNORMAL HIGH (ref 11.5–15.5)
RDW: 16.3 % — ABNORMAL HIGH (ref 11.5–15.5)
WBC: 17.2 10*3/uL — AB (ref 4.0–10.5)
WBC: 19.9 10*3/uL — AB (ref 4.0–10.5)

## 2016-01-25 LAB — GLUCOSE, CAPILLARY
GLUCOSE-CAPILLARY: 96 mg/dL (ref 65–99)
GLUCOSE-CAPILLARY: 114 mg/dL — AB (ref 65–99)
GLUCOSE-CAPILLARY: 110 mg/dL — AB (ref 65–99)
Glucose-Capillary: 106 mg/dL — ABNORMAL HIGH (ref 65–99)
Glucose-Capillary: 116 mg/dL — ABNORMAL HIGH (ref 65–99)

## 2016-01-25 LAB — PROTIME-INR
INR: 4.53
INR: 1.29
INR: 1.39
PROTHROMBIN TIME: 44.2 s — AB (ref 11.4–15.2)
PROTHROMBIN TIME: 16.2 s — AB (ref 11.4–15.2)
PROTHROMBIN TIME: 17.2 s — AB (ref 11.4–15.2)

## 2016-01-25 LAB — CK: CK TOTAL: 225 U/L (ref 49–397)

## 2016-01-25 MED ORDER — SODIUM CHLORIDE 0.9 % IV BOLUS (SEPSIS)
1000.0000 mL | Freq: Once | INTRAVENOUS | Status: AC
  Administered 2016-01-25: 1000 mL via INTRAVENOUS

## 2016-01-25 MED ORDER — SODIUM CHLORIDE 0.9 % IV BOLUS (SEPSIS)
500.0000 mL | Freq: Once | INTRAVENOUS | Status: AC
  Administered 2016-01-25: 500 mL via INTRAVENOUS

## 2016-01-25 MED ORDER — SODIUM CHLORIDE 0.9% FLUSH: 10.0000 mL | INTRAVENOUS | Status: DC | PRN

## 2016-01-25 MED ORDER — SODIUM CHLORIDE 0.9 % IV BOLUS (SEPSIS): 500.0000 mL | Freq: Once | INTRAVENOUS | Status: AC

## 2016-01-25 MED ORDER — SODIUM CHLORIDE 0.9 % IV SOLN
50.0000 ug/h | INTRAVENOUS | Status: DC
  Administered 2016-01-25 – 2016-01-28 (×8): 50 ug/h via INTRAVENOUS
  Filled 2016-01-25 (×14): qty 1

## 2016-01-25 MED ORDER — SODIUM CHLORIDE 0.9% FLUSH
10.0000 mL | Freq: Two times a day (BID) | INTRAVENOUS | Status: DC
  Administered 2016-01-25 – 2016-01-26 (×3): 10 mL

## 2016-01-25 MED ORDER — SODIUM CHLORIDE 0.9 % IV SOLN
80.0000 mg | Freq: Once | INTRAVENOUS | Status: AC
  Administered 2016-01-25: 80 mg via INTRAVENOUS
  Filled 2016-01-25: qty 80

## 2016-01-25 MED ORDER — PROTHROMBIN COMPLEX CONC HUMAN 500 UNITS IV KIT
5000.0000 [IU] | PACK | Status: AC
  Administered 2016-01-25: 5000 [IU] via INTRAVENOUS
  Filled 2016-01-25: qty 200

## 2016-01-25 MED ORDER — OCTREOTIDE LOAD VIA INFUSION
100.0000 ug | Freq: Once | INTRAVENOUS | Status: AC
Start: 2016-01-25 — End: 2016-01-25
  Administered 2016-01-25: 100 ug via INTRAVENOUS
  Filled 2016-01-25: qty 50

## 2016-01-25 MED ORDER — ONDANSETRON HCL 4 MG/2ML IJ SOLN
INTRAMUSCULAR | Status: AC
  Administered 2016-01-25: 4 mg
  Filled 2016-01-25: qty 2

## 2016-01-25 MED ORDER — DEXTROSE 5 % IV SOLN
0.0000 ug/min | INTRAVENOUS | Status: DC
  Administered 2016-01-25: 2 ug/min via INTRAVENOUS
  Administered 2016-01-25: 9 ug/min via INTRAVENOUS
  Administered 2016-01-26: 6 ug/min via INTRAVENOUS
  Filled 2016-01-25 (×3): qty 4

## 2016-01-25 MED ORDER — VITAMIN K1 10 MG/ML IJ SOLN
5.0000 mg | Freq: Once | INTRAMUSCULAR | Status: AC
  Administered 2016-01-25: 5 mg via INTRAVENOUS
  Filled 2016-01-25: qty 0.5

## 2016-01-25 MED ORDER — ALBUMIN HUMAN 25 % IV SOLN
12.5000 g | Freq: Once | INTRAVENOUS | Status: AC
  Administered 2016-01-25: 12.5 g via INTRAVENOUS
  Filled 2016-01-25: qty 50

## 2016-01-25 MED ORDER — VITAMIN K1 10 MG/ML IJ SOLN: 10.0000 mg | INTRAVENOUS | Status: DC

## 2016-01-25 MED ORDER — HYDROCORTISONE NA SUCCINATE PF 100 MG IJ SOLR
100.0000 mg | Freq: Once | INTRAMUSCULAR | Status: AC
  Administered 2016-01-25: 100 mg via INTRAVENOUS
  Filled 2016-01-25: qty 2

## 2016-01-25 MED ORDER — ALBUMIN HUMAN 25 % IV SOLN
25.0000 g | Freq: Once | INTRAVENOUS | Status: AC
  Administered 2016-01-25: 12.5 g via INTRAVENOUS
  Filled 2016-01-25: qty 50

## 2016-01-25 MED ORDER — SODIUM CHLORIDE 0.9 % IV SOLN
8.0000 mg/h | INTRAVENOUS | Status: AC
  Administered 2016-01-25 – 2016-01-27 (×6): 8 mg/h via INTRAVENOUS
  Filled 2016-01-25 (×11): qty 80

## 2016-01-25 MED ORDER — PHYTONADIONE 5 MG PO TABS
5.0000 mg | ORAL_TABLET | Freq: Every day | ORAL | Status: DC
  Administered 2016-01-25: 5 mg via ORAL
  Filled 2016-01-25: qty 1

## 2016-01-25 MED ORDER — SODIUM CHLORIDE 0.9 % IV BOLUS (SEPSIS)
1000.0000 mL | Freq: Once | INTRAVENOUS | Status: AC
  Administered 2016-01-25: 500 mL via INTRAVENOUS

## 2016-01-25 NOTE — Progress Notes (Signed)
Came back to the ICU to check on this patient prior to end of shift.  Labs were never collected because the patient has anasarca.  Coffee ground emesis decreased but blood pressures have remained low.  Systolic blood pressures were mid 80's - 90's most of the night with MAPs in the mid 50's, with a sudden decline right at shift change.  Additional NS bolus ordered while awaiting IV hydrocortisone and additional albumin infusion.  The patient has already been discussed with PCCM earlier in the shift; there were no new recommendations.  DNR status acknowledged, but I felt like we should continue attempts at blood pressure stabilization while waiting to transfer care back to his attending.  Patient is high risk for any kind of central access placement due to severe coagulopathy (and we were never able to get a repeat INR after two dose of vitamin K; oral dose on dayshift, IV dose at onset of coffee ground emesis).  Low dose levophed initiated through available peripheral IV.  Case discussed directly with Dr. Jarvis NewcomerGrunz this AM.

## 2016-01-25 NOTE — Consult Note (Signed)
Reason for Consult: Coffee ground emesis probable cirrhosis Referring Physician: Hospital team  Vincent Gibson is an 75 y.o. male.  HPI: Patient seen and examined and our office computer chart reviewed and his hospital computer chart reviewed and he has had a significant acute illness on top of some chronic medical problems and on CT does have probable cirrhosis but without IV contrast the radiologist could not say whether he had varices or not and his spleen is not enlarged and he really has not had any GI complaints and has not eaten in 3-4 days and did have a screening colonoscopy in our office in 2014 but has not had an endoscopy before and his primary doctor did mention possible liver disease but he has not seen anyone about that and he has had some swelling problems for some time and he is feeling much better in the hospital and we did discuss him being a Jehovah's Witness but he would take FFP in a life-threatening situation which we discussed and he has no other complaints  Past Medical History:  Diagnosis Date  . A-fib ([place])   . CHF (congestive heart failure) ([place])   . Enlarged heart   . Hyperlipemia   . Hypertension     History reviewed. No pertinent surgical history.  History reviewed. No pertinent family history.  Social History:  reports that he has never smoked. He has never used smokeless tobacco. He reports that he does not drink alcohol or use drugs.  Allergies: No Known Allergies  Medications: I have reviewed the patient's current medications.  Results for orders placed or performed during the hospital encounter of 01/06/2016 (from the past 48 hour(s))  I-Stat CG4 Lactic Acid, ED     Status: Abnormal   Collection Time: 01/20/2016 11:29 AM  Result Value Ref Range   Lactic Acid, Venous 4.63 (HH) 0.5 - 1.9 mmol/L   Comment NOTIFIED PHYSICIAN   Blood gas, venous     Status: Abnormal   Collection Time: 01/06/2016 12:25 PM  Result Value Ref Range   pH, Ven 7.346 7.250 - 7.430    pCO2, Ven 37.8 (L) 44.0 - 60.0 mmHg   pO2, Ven 41.9 32.0 - 45.0 mmHg   Bicarbonate 20.1 20.0 - 28.0 mmol/L   Acid-base deficit 4.5 (H) 0.0 - 2.0 mmol/L   O2 Saturation 69.5 %   Patient temperature 98.6    Collection site VEIN    Drawn by COLLECTED BY LABORATORY    Sample type VEIN   Culture, blood (routine x 2)     Status: None (Preliminary result)   Collection Time: 01/04/2016 12:48 PM  Result Value Ref Range   Specimen Description BLOOD LEFT ANTECUBITAL    Special Requests BOTTLES DRAWN AEROBIC AND ANAEROBIC 5 CC EA    Culture      NO GROWTH < 24 HOURS Performed at [facility]    Report Status PENDING   Culture, blood (routine x 2)     Status: None (Preliminary result)   Collection Time: 01/18/2016 12:48 PM  Result Value Ref Range   Specimen Description BLOOD LEFT ANTECUBITAL    Special Requests BOTTLES DRAWN AEROBIC AND ANAEROBIC 5 CC EA    Culture  Setup Time      GRAM NEGATIVE RODS ANAEROBIC BOTTLE ONLY CRITICAL RESULT CALLED TO, READ BACK BY AND VERIFIED WITH: Guadlupe Spanish, PHARM AT [place] ON [id number] BY Rhea Bleacher Performed at [place]    Report  Status PENDING   Blood Culture ID Panel (Reflexed)     Status: None   Collection Time: 01/14/2016 12:48 PM  Result Value Ref Range   Enterococcus species NOT DETECTED NOT DETECTED   Listeria monocytogenes NOT DETECTED NOT DETECTED   Staphylococcus species NOT DETECTED NOT DETECTED   Staphylococcus aureus NOT DETECTED NOT DETECTED   Streptococcus species NOT DETECTED NOT DETECTED   Streptococcus agalactiae NOT DETECTED NOT DETECTED   Streptococcus pneumoniae NOT DETECTED NOT DETECTED   Streptococcus pyogenes NOT DETECTED NOT DETECTED   Acinetobacter baumannii NOT DETECTED NOT DETECTED   Enterobacteriaceae species NOT DETECTED NOT DETECTED   Enterobacter cloacae complex NOT DETECTED NOT DETECTED   Escherichia coli NOT DETECTED NOT DETECTED   Klebsiella oxytoca NOT DETECTED NOT  DETECTED   Klebsiella pneumoniae NOT DETECTED NOT DETECTED   Proteus species NOT DETECTED NOT DETECTED   Serratia marcescens NOT DETECTED NOT DETECTED   Haemophilus influenzae NOT DETECTED NOT DETECTED   Neisseria meningitidis NOT DETECTED NOT DETECTED   Pseudomonas aeruginosa NOT DETECTED NOT DETECTED   Candida albicans NOT DETECTED NOT DETECTED   Candida glabrata NOT DETECTED NOT DETECTED   Candida krusei NOT DETECTED NOT DETECTED   Candida parapsilosis NOT DETECTED NOT DETECTED   Candida tropicalis NOT DETECTED NOT DETECTED    Comment: Performed at [facility]  CBG monitoring, ED     Status: Abnormal   Collection Time: 01/04/2016 12:56 PM  Result Value Ref Range   Glucose-Capillary 102 (H) 65 - 99 mg/dL  Lactic acid, plasma     Status: Abnormal   Collection Time: 01/24/2016  2:53 PM  Result Value Ref Range   Lactic Acid, Venous 3.0 (HH) 0.5 - 1.9 mmol/L    Comment: CRITICAL RESULT CALLED TO, READ BACK BY AND VERIFIED WITH: ROSSER,M. AT 15:55 02/02/2016 BY THOMPSON,N.   Procalcitonin     Status: None   Collection Time: 02/02/2016  2:53 PM  Result Value Ref Range   Procalcitonin 16.25 ng/mL    Comment:        Interpretation: PCT >= 10 ng/mL: Important systemic inflammatory response, almost exclusively due to severe bacterial sepsis or septic shock. (NOTE)         ICU PCT Algorithm               Non ICU PCT Algorithm    ----------------------------     ------------------------------         PCT < 0.25 ng/mL                 PCT < 0.1 ng/mL     Stopping of antibiotics            Stopping of antibiotics       strongly encouraged.               strongly encouraged.    ----------------------------     ------------------------------       PCT level decrease by               PCT < 0.25 ng/mL       >= 80% from peak PCT       OR PCT 0.25 - 0.5 ng/mL          Stopping of antibiotics  encouraged.     Stopping of antibiotics            encouraged.    ----------------------------     ------------------------------       PCT level decrease by              PCT >= 0.25 ng/mL       < 80% from peak PCT        AND PCT >= 0.5 ng/mL             Continuing antibiotics                                              encouraged.       Continuing antibiotics            encouraged.    ----------------------------     ------------------------------     PCT level increase compared          PCT > 0.5 ng/mL         with peak PCT AND          PCT >= 0.5 ng/mL             Escalation of antibiotics                                          strongly encouraged.      Escalation of antibiotics        strongly encouraged.   APTT     Status: Abnormal   Collection Time: 01/14/2016  2:53 PM  Result Value Ref Range   aPTT 56 (H) 24 - 36 seconds    Comment:        IF BASELINE aPTT IS ELEVATED, SUGGEST PATIENT RISK ASSESSMENT BE USED TO DETERMINE APPROPRIATE ANTICOAGULANT THERAPY.   Urinalysis, Routine w reflex microscopic     Status: Abnormal   Collection Time: 01/04/2016  3:07 PM  Result Value Ref Range   Color, Urine AMBER (A) YELLOW    Comment: BIOCHEMICALS MAY BE AFFECTED BY COLOR   APPearance CLOUDY (A) CLEAR   Specific Gravity, Urine 1.018 1.005 - 1.030   pH 5.5 5.0 - 8.0   Glucose, UA NEGATIVE NEGATIVE mg/dL   Hgb urine dipstick SMALL (A) NEGATIVE   Bilirubin Urine SMALL (A) NEGATIVE   Ketones, ur NEGATIVE NEGATIVE mg/dL   Protein, ur NEGATIVE NEGATIVE mg/dL   Nitrite NEGATIVE NEGATIVE   Leukocytes, UA NEGATIVE NEGATIVE  Urine microscopic-add on     Status: Abnormal   Collection Time: 01/17/2016  3:07 PM  Result Value Ref Range   Squamous Epithelial / LPF 0-5 (A) NONE SEEN   WBC, UA 0-5 0 - 5 WBC/hpf   RBC / HPF 0-5 0 - 5 RBC/hpf   Bacteria, UA FEW (A) NONE SEEN  Sodium, urine, random     Status: None   Collection Time: 01/30/2016  3:30 PM  Result Value Ref Range   Sodium, Ur <10 mmol/L    Comment: Performed at [facility]  CBG monitoring, ED     Status: None   Collection Time: 01/17/2016  4:29 PM  Result Value Ref Range   Glucose-Capillary 96 65 - 99 mg/dL  Culture, Urine     Status: None  Collection Time: 01/08/2016  5:16 PM  Result Value Ref Range   Specimen Description URINE, RANDOM    Special Requests NONE    Culture NO GROWTH Performed at [facility]     Report Status 01/24/2016 FINAL   MRSA PCR Screening     Status: None   Collection Time: 01/13/2016  5:21 PM  Result Value Ref Range   MRSA by PCR NEGATIVE NEGATIVE    Comment:        The GeneXpert MRSA Assay (FDA approved for NASAL specimens only), is one component of a comprehensive MRSA colonization surveillance program. It is not intended to diagnose MRSA infection nor to guide or monitor treatment for MRSA infections.   Lactic acid, plasma     Status: Abnormal   Collection Time: 01/14/2016  6:08 PM  Result Value Ref Range   Lactic Acid, Venous 3.1 (HH) 0.5 - 1.9 mmol/L    Comment: CRITICAL RESULT CALLED TO, READ BACK BY AND VERIFIED WITH: N.PALANIVEL RN AT 1901 ON 01/07/2016 BY S.VANHOORNE   Glucose, capillary     Status: None   Collection Time: 01/20/2016  9:04 PM  Result Value Ref Range   Glucose-Capillary 89 65 - 99 mg/dL   Comment 1 Notify RN    Comment 2 Document in Chart   Lactic acid, plasma     Status: Abnormal   Collection Time: 01/12/2016  9:14 PM  Result Value Ref Range   Lactic Acid, Venous 2.6 (HH) 0.5 - 1.9 mmol/L    Comment: CRITICAL RESULT CALLED TO, READ BACK BY AND VERIFIED WITH: B DAVIS RN @ 2211 ON 01/31/2016 BY C DAVIS   Glucose, capillary     Status: None   Collection Time: 01/09/2016 11:23 PM  Result Value Ref Range   Glucose-Capillary 84 65 - 99 mg/dL   Comment 1 Notify RN    Comment 2 Document in Chart   Glucose, capillary     Status: None   Collection Time: 01/24/16  3:49 AM  Result Value Ref Range   Glucose-Capillary 89 65 - 99 mg/dL  Glucose, capillary     Status: None   Collection Time:  01/24/16  7:27 AM  Result Value Ref Range   Glucose-Capillary 66 65 - 99 mg/dL   Comment 1 Notify RN    Comment 2 Document in Chart   Glucose, capillary     Status: None   Collection Time: 01/24/16  8:45 AM  Result Value Ref Range   Glucose-Capillary 98 65 - 99 mg/dL   Comment 1 Notify RN    Comment 2 Document in Chart   Basic metabolic panel     Status: Abnormal   Collection Time: 01/24/16  9:46 AM  Result Value Ref Range   Sodium 143 135 - 145 mmol/L   Potassium 3.5 3.5 - 5.1 mmol/L    Comment: DELTA CHECK NOTED REPEATED TO VERIFY    Chloride 110 101 - 111 mmol/L   CO2 23 22 - 32 mmol/L   Glucose, Bld 113 (H) 65 - 99 mg/dL   BUN 87 (H) 6 - 20 mg/dL   Creatinine, Ser 3.30 (H) 0.61 - 1.24 mg/dL   Calcium 7.9 (L) 8.9 - 10.3 mg/dL   GFR calc non Af Amer 17 (L) >60 mL/min   GFR calc Af Amer 20 (L) >60 mL/min    Comment: (NOTE) The eGFR has been calculated using the CKD EPI equation. This calculation has not been validated in all clinical situations. eGFR's persistently <  60 mL/min signify possible Chronic Kidney Disease.    Anion gap 10 5 - 15  CBC     Status: Abnormal   Collection Time: 01/24/16  9:46 AM  Result Value Ref Range   WBC 15.5 (H) 4.0 - 10.5 K/uL   RBC 3.45 (L) 4.22 - 5.81 MIL/uL   Hemoglobin 11.0 (L) 13.0 - 17.0 g/dL   HCT 32.2 (L) 39.0 - 52.0 %   MCV 93.3 78.0 - 100.0 fL   MCH 31.9 26.0 - 34.0 pg   MCHC 34.2 30.0 - 36.0 g/dL   RDW 15.9 (H) 11.5 - 15.5 %   Platelets 150 150 - 400 K/uL  Lactic acid, plasma     Status: Abnormal   Collection Time: 01/24/16  9:46 AM  Result Value Ref Range   Lactic Acid, Venous 2.2 (HH) 0.5 - 1.9 mmol/L    Comment: CRITICAL RESULT CALLED TO, READ BACK BY AND VERIFIED WITH: HABIB,I RN [id number] COVINGTON,N   Protime-INR     Status: Abnormal   Collection Time: 01/24/16  9:46 AM  Result Value Ref Range   Prothrombin Time 84.8 (H) 11.4 - 15.2 seconds   INR 10.23 (HH)     Comment: RESULT REPEATED AND VERIFIED CRITICAL  RESULT CALLED TO, READ BACK BY AND VERIFIED WITH: HABIB,I AT 1100 ON [id number] BY HOOKER,B   Glucose, capillary     Status: Abnormal   Collection Time: 01/24/16 12:42 PM  Result Value Ref Range   Glucose-Capillary 116 (H) 65 - 99 mg/dL   Comment 1 Notify RN    Comment 2 Document in Chart   Lactic acid, plasma     Status: Abnormal   Collection Time: 01/24/16  1:12 PM  Result Value Ref Range   Lactic Acid, Venous 2.0 (HH) 0.5 - 1.9 mmol/L    Comment: CRITICAL RESULT CALLED TO, READ BACK BY AND VERIFIED WITH: HABIB,I RN [id number] COVINGTON,N   Troponin I     Status: Abnormal   Collection Time: 01/24/16  1:12 PM  Result Value Ref Range   Troponin I 0.25 (HH) <0.03 ng/mL    Comment: CRITICAL VALUE NOTED.  VALUE IS CONSISTENT WITH PREVIOUSLY REPORTED AND CALLED VALUE.  Glucose, capillary     Status: Abnormal   Collection Time: 01/24/16  5:01 PM  Result Value Ref Range   Glucose-Capillary 100 (H) 65 - 99 mg/dL   Comment 1 Notify RN    Comment 2 Document in Chart   Glucose, capillary     Status: Abnormal   Collection Time: 01/24/16 10:52 PM  Result Value Ref Range   Glucose-Capillary 114 (H) 65 - 99 mg/dL  CK     Status: None   Collection Time: 01/25/16  3:13 AM  Result Value Ref Range   Total CK 225 49 - 397 U/L  Comprehensive metabolic panel     Status: Abnormal   Collection Time: 01/25/16  3:13 AM  Result Value Ref Range   Sodium 145 135 - 145 mmol/L   Potassium 3.4 (L) 3.5 - 5.1 mmol/L   Chloride 113 (H) 101 - 111 mmol/L   CO2 24 22 - 32 mmol/L   Glucose, Bld 112 (H) 65 - 99 mg/dL   BUN 93 (H) 6 - 20 mg/dL   Creatinine, Ser 3.14 (H) 0.61 - 1.24 mg/dL   Calcium 6.9 (L) 8.9 - 10.3 mg/dL   Total Protein 3.4 (L) 6.5 - 8.1 g/dL   Albumin 1.4 (L) 3.5 - 5.0 g/dL  AST 25 15 - 41 U/L   ALT 16 (L) 17 - 63 U/L   Alkaline Phosphatase 34 (L) 38 - 126 U/L   Total Bilirubin 1.7 (H) 0.3 - 1.2 mg/dL   GFR calc non Af Amer 18 (L) >60 mL/min   GFR calc Af Amer 21 (L) >60 mL/min     Comment: (NOTE) The eGFR has been calculated using the CKD EPI equation. This calculation has not been validated in all clinical situations. eGFR's persistently <60 mL/min signify possible Chronic Kidney Disease.    Anion gap 8 5 - 15  Glucose, capillary     Status: Abnormal   Collection Time: 01/25/16  7:27 AM  Result Value Ref Range   Glucose-Capillary 106 (H) 65 - 99 mg/dL   Comment 1 Notify RN    Comment 2 Document in Chart   CBC     Status: Abnormal   Collection Time: 01/25/16  8:14 AM  Result Value Ref Range   WBC 17.2 (H) 4.0 - 10.5 K/uL   RBC 3.10 (L) 4.22 - 5.81 MIL/uL   Hemoglobin 9.8 (L) 13.0 - 17.0 g/dL   HCT 28.6 (L) 39.0 - 52.0 %   MCV 92.3 78.0 - 100.0 fL   MCH 31.6 26.0 - 34.0 pg   MCHC 34.3 30.0 - 36.0 g/dL   RDW 16.0 (H) 11.5 - 15.5 %   Platelets 120 (L) 150 - 400 K/uL  Protime-INR     Status: Abnormal   Collection Time: 01/25/16  8:14 AM  Result Value Ref Range   Prothrombin Time 44.2 (H) 11.4 - 15.2 seconds   INR 4.53 (HH)     Comment: CONSISTENT WITH PREVIOUS RESULT CRITICAL RESULT CALLED TO, READ BACK BY AND VERIFIED WITH: S MAYES AT 0845 ON 10.22.2017 BY NBROOKS     Ct Abdomen Pelvis Wo Contrast  Result Date: 01/24/2016 CLINICAL DATA:  75 year old male inpatient with a history of atrial fibrillation on Coumadin, CHF, admitted after being found down for 3 days in the bathtub, treated for sepsis and acute renal failure, now with acute respiratory failure, ileus and abdominal distention. Left lung opacities on chest radiograph. EXAM: CT CHEST, ABDOMEN AND PELVIS WITHOUT CONTRAST TECHNIQUE: Multidetector CT imaging of the chest, abdomen and pelvis was performed following the standard protocol without IV contrast. COMPARISON:  01/24/2016 abdominal radiographs and 01/27/2016 chest radiographs. 07/02/2014 CT abdomen/pelvis. FINDINGS: Examination is significantly limited by patient body habitus, streak artifact from the patient's upper extremities, motion  artifact and lack of IV contrast. CT CHEST FINDINGS Cardiovascular: Mild cardiomegaly. No significant pericardial fluid/thickening. Mildly atherosclerotic nonaneurysmal thoracic aorta. Dilated main pulmonary artery (3.7 cm diameter). Mediastinum/Nodes: No discrete thyroid nodules. Unremarkable esophagus. No pathologically enlarged axillary, mediastinal or gross hilar lymph nodes, noting limited sensitivity for the detection of hilar adenopathy on this noncontrast study. Lungs/Pleura: No pneumothorax. Small layering right pleural effusion. No left pleural effusion. There is layering debris in the tracheal lumen at the level of the thoracic inlet. There is a mosaic attenuation throughout both lungs. There are patchy bandlike areas of consolidation with associated volume loss in the bilateral lower lobes, favor atelectasis. Thin parenchymal bands in the right middle lobe and lingula are most consistent with areas of postinfectious/ postinflammatory scarring. No lung masses or significant pulmonary nodules in the aerated portions of the lungs. Musculoskeletal: No aggressive appearing focal osseous lesions. Mild-to-moderate thoracic spondylosis. Anasarca. CT ABDOMEN PELVIS FINDINGS Hepatobiliary: There is relative hypertrophy of the left liver lobe and the liver surface is  diffusely irregular, consistent with cirrhosis. No gross liver mass. Normal gallbladder with no radiopaque cholelithiasis. No biliary ductal dilatation. Pancreas: Normal, with no mass or duct dilation. Spleen: Normal size. No mass. Adrenals/Urinary Tract: No discrete adrenal nodules. No hydronephrosis. No renal stones. Simple 2.3 cm posterior upper right renal cyst. Simple 3.1 cm lateral interpolar left renal cyst. No additional contour deforming renal masses. Bladder is nearly collapsed by indwelling Foley catheter, with the suggestion of diffuse bladder wall thickening. Stomach/Bowel: Grossly normal stomach. Normal caliber small bowel with no small  bowel wall thickening. Faintly visualized normal appendix. There is mild dilatation throughout the large bowel with mild stool and scattered fluid levels in the large bowel. No large bowel wall thickening. Vascular/Lymphatic: Mildly atherosclerotic nonaneurysmal abdominal aorta. No gross pathologically enlarged lymph nodes in the abdomen or pelvis. Reproductive: Markedly enlarged prostate, not appreciably changed. Other: No pneumoperitoneum.  Large volume simple ascites. Musculoskeletal: No aggressive appearing focal osseous lesions. Moderate lumbar spondylosis. Anasarca. IMPRESSION: 1. Cirrhosis. No gross liver mass on this limited noncontrast CT study. Large volume ascites. Normal size spleen. 2. Mild diffuse large bowel dilatation with colonic fluid levels, consistent with mild colonic ileus. 3. Anasarca. 4. Cardiomegaly.  Small layering right pleural effusion. 5. Prominently dilated main pulmonary artery, likely indicating pulmonary arterial hypertension. Mosaic attenuation throughout the lungs could be due to mosaic perfusion from pulmonary vascular disease versus air trapping from small airways disease. 6. Patchy bandlike areas of consolidation with associated volume loss in the lower lungs, favor atelectasis. 7. Markedly enlarged prostate. 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 Bladder nearly collapsed by indwelling Foley catheter. No hydronephrosis. 8. Aortic atherosclerosis. Electronically Signed   By: Ilona Sorrel M.D.   On: 01/24/2016 18:30   Ct Chest Wo Contrast  Result Date: 01/24/2016 CLINICAL DATA:  75 year old male inpatient with a history of atrial fibrillation on Coumadin, CHF, admitted after being found down for 3 days in the bathtub, treated for sepsis and acute renal failure, now with acute respiratory failure, ileus and abdominal distention. Left lung opacities on chest radiograph. EXAM: CT CHEST, ABDOMEN AND PELVIS WITHOUT  CONTRAST TECHNIQUE: Multidetector CT imaging of the chest, abdomen and pelvis was performed following the standard protocol without IV contrast. COMPARISON:  01/24/2016 abdominal radiographs and 01/12/2016 chest radiographs. 07/02/2014 CT abdomen/pelvis. FINDINGS: Examination is significantly limited by patient body habitus, streak artifact from the patient's upper extremities, motion artifact and lack of IV contrast. CT CHEST FINDINGS Cardiovascular: Mild cardiomegaly. No significant pericardial fluid/thickening. Mildly atherosclerotic nonaneurysmal thoracic aorta. Dilated main pulmonary artery (3.7 cm diameter). Mediastinum/Nodes: No discrete thyroid nodules. Unremarkable esophagus. No pathologically enlarged axillary, mediastinal or gross hilar lymph nodes, noting limited sensitivity for the detection of hilar adenopathy on this noncontrast study. Lungs/Pleura: No pneumothorax. Small layering right pleural effusion. No left pleural effusion. There is layering debris in the tracheal lumen at the level of the thoracic inlet. There is a mosaic attenuation throughout both lungs. There are patchy bandlike areas of consolidation with associated volume loss in the bilateral lower lobes, favor atelectasis. Thin parenchymal bands in the right middle lobe and lingula are most consistent with areas of postinfectious/ postinflammatory scarring. No lung masses or significant pulmonary nodules in the aerated portions of the lungs. Musculoskeletal: No aggressive appearing focal osseous lesions. Mild-to-moderate thoracic spondylosis. Anasarca. CT ABDOMEN PELVIS FINDINGS Hepatobiliary: There is relative hypertrophy of the left liver lobe and the liver surface is diffusely irregular, consistent with cirrhosis. No gross liver  mass. Normal gallbladder with no radiopaque cholelithiasis. No biliary ductal dilatation. Pancreas: Normal, with no mass or duct dilation. Spleen: Normal size. No mass. Adrenals/Urinary Tract: No discrete  adrenal nodules. No hydronephrosis. No renal stones. Simple 2.3 cm posterior upper right renal cyst. Simple 3.1 cm lateral interpolar left renal cyst. No additional contour deforming renal masses. Bladder is nearly collapsed by indwelling Foley catheter, with the suggestion of diffuse bladder wall thickening. Stomach/Bowel: Grossly normal stomach. Normal caliber small bowel with no small bowel wall thickening. Faintly visualized normal appendix. There is mild dilatation throughout the large bowel with mild stool and scattered fluid levels in the large bowel. No large bowel wall thickening. Vascular/Lymphatic: Mildly atherosclerotic nonaneurysmal abdominal aorta. No gross pathologically enlarged lymph nodes in the abdomen or pelvis. Reproductive: Markedly enlarged prostate, not appreciably changed. Other: No pneumoperitoneum.  Large volume simple ascites. Musculoskeletal: No aggressive appearing focal osseous lesions. Moderate lumbar spondylosis. Anasarca. IMPRESSION: 1. Cirrhosis. No gross liver mass on this limited noncontrast CT study. Large volume ascites. Normal size spleen. 2. Mild diffuse large bowel dilatation with colonic fluid levels, consistent with mild colonic ileus. 3. Anasarca. 4. Cardiomegaly.  Small layering right pleural effusion. 5. Prominently dilated main pulmonary artery, likely indicating pulmonary arterial hypertension. Mosaic attenuation throughout the lungs could be due to mosaic perfusion from pulmonary vascular disease versus air trapping from small airways disease. 6. Patchy bandlike areas of consolidation with associated volume loss in the lower lungs, favor atelectasis. 7. Markedly enlarged prostate. 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 Bladder nearly collapsed by indwelling Foley catheter. No hydronephrosis. 8. Aortic atherosclerosis. Electronically Signed   By: Ilona Sorrel M.D.   On: 01/24/2016 18:30   Dg Chest Port 1  View  Result Date: 01/25/2016 CLINICAL DATA:  Respiratory distress,  hypotension EXAM: PORTABLE CHEST 1 VIEW COMPARISON:  10/20/ 17 FINDINGS: Cardiomegaly is noted. There is NG tube coiled within stomach with tip in proximal stomach. Elevation of the right hemidiaphragm again noted. Persistent streaky left base retrocardiac atelectasis or infiltrate. IMPRESSION: There is NG tube coiled within stomach with tip in proximal stomach. Elevation of the right hemidiaphragm again noted. Persistent streaky left base retrocardiac atelectasis or infiltrate. Electronically Signed   By: Lahoma Crocker M.D.   On: 01/25/2016 10:23   Dg Abd Acute W/chest  Result Date: 01/09/2016 CLINICAL DATA:  75 year old male with abdominal distention. Initial encounter. EXAM: DG ABDOMEN ACUTE W/ 1V CHEST COMPARISON:  CT Abdomen and Pelvis 06/24/2014 and earlier. FINDINGS: Chronic cardiomegaly appears not significantly changed since 2011. However, there is abnormal increased left hilar and retrocardiac density. No pneumothorax or pleural effusion. Crowding of lung markings elsewhere. No edema suspected. Large body habitus. No pneumoperitoneum. Supine and left-side-down lateral decubitus views of the abdomen are provided. Mild to moderate gaseous distension of the stomach and throughout the large bowel. Several gas-filled but nondilated small bowel loops are noted. Paucity of gas in the rectum. Furthermore there is a hazy gray opacity throughout the abdomen. This can be seen with ascites. No acute or suspicious osseous lesion identified. IMPRESSION: 1. Abnormal increased left hilar and retrocardiac density in the chest. PA and lateral chest radiographs would be helpful when possible. Failing that, Chest CT (IV contrast preferred) would be recommended. 2. Pearline Cables hazy opacity throughout the abdomen suggesting Ascites. 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 3. Gas distended stomach and large bowel. Paucity of  gas in the distal sigmoid  colon and rectum. Small bowel loops nondilated at this time. Differential considerations include ileus and distal colon obstruction. Note that the prostate was severely enlarged on the 2016 CT Abdomen and Pelvis. Electronically Signed   By: Genevie Ann M.D.   On: 01/10/2016 12:03   Dg Abd Portable 1v  Result Date: 01/25/2016 CLINICAL DATA:  Check nasogastric catheter placement EXAM: PORTABLE ABDOMEN - 1 VIEW COMPARISON:  01/24/2016 FINDINGS: Scattered large and small bowel gas is noted. The degree of gaseous distension of the colon is stable. A nasogastric catheter is now seen within the stomach. No free air is seen. IMPRESSION: New nasogastric catheter within the stomach.  Stable colonic ileus. Electronically Signed   By: Inez Catalina M.D.   On: 01/25/2016 10:23   Dg Abd Portable 1v  Result Date: 01/24/2016 CLINICAL DATA:  Ileus EXAM: PORTABLE ABDOMEN - 1 VIEW COMPARISON:  02/03/2016 FINDINGS: Scattered large and small bowel gas is noted. Gas distension of the colon is again identified and stable from the prior exam. The overall appearance is similar to that seen on the previous day. No free air is noted. Foley catheter is noted. IMPRESSION: Changes consistent with a colonic ileus. Correlation with physical exam is recommended. No significant change from the previous day is seen. Electronically Signed   By: Inez Catalina M.D.   On: 01/24/2016 07:16    ROS he does have some urinary issues Blood pressure (!) 78/35, pulse 87, temperature 97.3 F (36.3 C), temperature source Oral, resp. rate 13, height _0  (1.803 m), weight 129.8 kg (286 lb 2.5 oz), SpO2 98 %. 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 and subacute upper GI bleeding with increased INR but platelet count okay so far Plan: Continue vitamin K see above for our discussion of FFP if it became life-threatening bleeding when better would probably benefit from an endoscopy which we discussed unless needed sooner when necessary and if no signs of active bleeding can clamp NG tube later today or tomorrow and allow sips of clear liquids and continue octreotide and Protonix for now and agree with antibiotics for now and call his primary doctor on Monday to get his last set of labs for comparison and we will follow with you  Trent Theisen E 01/25/2016, 11:21 AM

## 2016-01-25 NOTE — Progress Notes (Signed)
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 He is concerned that his faith traditions concerning medical care are strictly followed. He feels there should not be a problem given his physician is aware of his faith and "the Jehov[employer] Witness has excellent advocates" that will monitor his situation.   Please page the chaplain immediately if there arises any question concerning Mr Dahlia Clientdward's faith needs and medical requirements.  Benjie Karvonenharles D. Dasani Thurlow, DMin Chaplain

## 2016-01-25 NOTE — Progress Notes (Signed)
PROGRESS NOTE  Vincent Gibson  ZOX:[id number] DOB: February 11, 1941 DOA: 2016/01/30 PCP: Georgianne Fick, MD  Brief Narrative: Vincent Gibson is a 75 y.o. 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 He stated he was too weak to get up and had not taken any po nor urinated or defecated during that time. On arrival he was alert and oriented, dry mucous membranes and had anasarca with leg swelling R>L with evidence of cellulitis on the right leg. Abdomen was tympanitic and distended. Rectal temperature was 96.80F, glucose 26mg /dl, hypotensive and tachycardic saturating 90% on room air, 99% with 2L O2. Labs were significant for creatinine 3.32, BUN 86, bicarbonate 21, CK 528, troponin 0.36, BNP 2,155. Abdomen with chest x-ray showed abnormal increased left hilar and retrocardiac density in the chest, findings suggestive of ascites and colonic ileus. INR on arrival was supratherapeutic at 6, jumped to 10 10/21. Oral vitamin K given during the day. CT imaging showed cirrhosis (no known history of this) and large volume ascites.   OVERNIGHT pt had ~900cc coffee ground emesis and hypotension, presumably from cairrhosis-associated variceal bleed with supratherapeutic INR. NG tube placed, IV NS boluses given, protonix gtt started, IV vitamin K was given, labs were not obtained due to anasarca. BP remained low despite NS, likely third spacing IV fluid. Pt is DNR per statement at admission when he had capacity for medical decision making, and remains DNR. Also a Jehovah's witness, refusing blood transfusions. Levophed started through PIV, octreotide gtt added. After discussion with patient, verbal consent for "blood substitutes" including albumin and Kcentra was obtained, and these are being given. Pt remains alert and oriented with MAPs 50s-60s.   Spoke with CCM, Dr. Craige Cotta, and GI, Dr. Ewing Schlein who will see the patient but  have no additional immediate medical recommendations. Discussed the case with palliative care as well. Emergency contact, Pamella Pert, was called and is at bedside. No next of kin have been located. Social work consulted for next of kin and pastoral care consulted.  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 Presumed variceal bleed. No varices seen on CT that was limited without contrast.  - Kcentra, Vit K given overnight - Recheck INR at 4.53, hgb 11 > 9.8. - Obtain central access if able: PICC vs. CVC. Serial CBC/INR - Volume resuscitation largely third spaced, change to albumin (alb 1.4) and hold further IVF - Continue levophed, titrate as able for MAP 65. Monitor for infiltration.  - CCM and GI to see  Acute renal failure/anasarca ?hepatorenal syndrome: Cr 3.32, unknown baseline (only prior creatinine was 1.5). Multifactorial: pre-renal due to dehydration from no po intake x 3 days, intrarenal suggested by U Na < 10 and pt on ARB, and postrenal with urinary retention due to BPH.  - No IV contrast. - Hold ARB and lasix - Coude Foley placed by urology in ED. Monitor UOP.  - Receiving judicious IVF given systolic left and right heart failure.   Severe sepsis due to cellulitis:  Lactic acidosis resolved and trending downward (3.1 > 2.2) - GNR's noted in 1 of 2 blood cultures from 10/20 with rapid reflex panel negative. Will continue zosyn and monitor.  - Continue vancomycin and zosyn (10/20 >> ) - Monitor blood cultures and urine culture. - No evidence of DVT on initial  venous doppler  Chronic systolic heart failure and right heart failure: EF 45-50%, mild LVH, on recent echo read by Dr. Elease Hashimoto, with moderate pulmonary HTN (PA peak ) and right ventricle mild-mod reduced function. LE edema and distended abdomen on exam, ?congestive hepatopathy with elevated INR and hypoalbuminemia. BNP 2155.  - Initial troponin elevated in setting of renal failure. ECG with IVCD, TWI's of unknown chronicity on admission ECG. Recheck shows downtrending.  - Hold ARB and lasix - Hold further IVF for now  Atrial fibrillation: Rate controlled.  - Hold BB now - Hold coumadin  Acute respiratory failure: Due to sepsis. No definite pulmonary infiltrates on CT - Oxygen prn for saturations to remain > 90%  Colonic ileus: Demonstrated on abd plain films. No volvulus on CT.  - Glycerin suppository. - Avoid fleet enema in renal failure.  Mild rhabdomyolysis: From 3 days laying in bathtub.  - Continue IVF, monitor renal function.  - Recheck CK in AM  Prolonged QTc interval: at admission - Telemetry - Monitor electrolytes. K and Mg wnl.  - Minimize provocative medications as able.  Urinary retention: Severely enlarged prostate on CT in 2016.  - Foley placed by urology. Voiding trial eventually prior to discharge.   DVT prophylaxis: Supratherapeutic INR Code Status: DNR confirmed at admission and this AM Family Communication: None available. Emergency contact was called and is coming to bedside.  Disposition Plan: ICU  Consultants:   CCM  GI  Urology  Social Work  Pastoral care  Procedures:   Foley placed by Dr. Perley Jain, urology 10/20  RLE venous doppler 10/20 negative preliminary read for DVT  RUE venous doppler 10/20 negative preliminary read for SVT or DVT  Antimicrobials:  Vancomycin (10/20 >> )  Zosyn (10/20 >> )   Subjective: Pt alert, oriented, says he feels better since NG tube. No complaints.   Objective: Vitals:    01/25/16 0515 01/25/16 0630 01/25/16 0700 01/25/16 0800  BP: (!) 80/34 (!) 86/45 (!) 74/42 (!) 75/32  Pulse: 71 81 (!) 42 93  Resp: (!) 8 10 (!) 0 12  Temp:      TempSrc:      SpO2: 100% 100% 100% 99%  Weight:      Height:        Intake/Output Summary (Last 24 hours) at 01/25/16 0845 Last data filed at 01/25/16 0800  Gross per 24 hour  Intake           751.52 ml  Output             1875 ml  Net         -1123.48 ml   Filed Weights   01/19/2016 1700 01/24/16 1800 01/25/16 0448  Weight: 125.6 kg (276 lb 14.4 oz) 125.2 kg (276 lb 0.3 oz) 129.8 kg (286 lb 2.5 oz)    Examination: General exam: 75 y.o. male in no distress HEENT: Dry mucous membranes. NG tube in right nare. Respiratory system: Mildly labored breathing with O2 by nasal cannula hovering 93-95%. Bibasilar crackles. Cardiovascular system: Irregular HR 90s. No murmur, rub, or gallop. No JVD. Gastrointestinal system: Abdomen protuberant, distended with hypoactive bowel sounds. Do not appreciate fluid wave. No  tenderness. Central nervous system: Alert and oriented. No focal neurological deficits. Decreased strength diffusely, but still 5/5 x 4 extremities. Sensation intact. No pain with right ankle ROM.   Extremities: Warm, diffuse anasarca with weeping x4 extremities. LEs with 3+ pitting edema and chronic stasis dermatitis. RLE with indistinct erythema anterolaterally above the ankle. No tracking noted. No drainage or fluctuance palpated. Skin: As above.  Psychiatry: Judgement and insight imparied. Mood calm  Data Reviewed: I have personally reviewed following labs and imaging studies  CBC:  Recent Labs Lab 01/11/2016 1100 01/24/16 0946 01/25/16 0814  WBC 13.3* 15.5* 17.2*  NEUTROABS 12.3*  --   --   HGB 11.9* 11.0* 9.8*  HCT 34.8* 32.2* 28.6*  MCV 93.0 93.3 92.3  PLT 205 150 120*   Basic Metabolic Panel:  Recent Labs Lab 01/19/2016 1100 01/24/16 0946 01/25/16 0313  NA 143 143 145  K 4.3 3.5 3.4*  CL 105 110  113*  CO2 21* 23 24  GLUCOSE 90 113* 112*  BUN 86* 87* 93*  CREATININE 3.32* 3.30* 3.14*  CALCIUM 8.9 7.9* 6.9*  MG 2.4  --   --    GFR: Estimated Creatinine Clearance: 27.9 mL/min (by C-G formula based on SCr of 3.14 mg/dL (H)). Liver Function Tests:  Recent Labs Lab 01/25/2016 1100 01/25/16 0313  AST 65* 25  ALT 29 16*  ALKPHOS 94 34*  BILITOT 4.7* 1.7*  PROT 7.4 3.4*  ALBUMIN 3.1* 1.4*   No results for input(s): LIPASE, AMYLASE in the last 168 hours. No results for input(s): AMMONIA in the last 168 hours. Coagulation Profile:  Recent Labs Lab 01/20/2016 1100 01/24/16 0946  INR 6.77* 10.23*   Cardiac Enzymes:  Recent Labs Lab 01/17/2016 1100 01/24/16 1312 01/25/16 0313  CKTOTAL 528*  --  225  TROPONINI 0.36* 0.25*  --    BNP (last 3 results) No results for input(s): PROBNP in the last 8760 hours. HbA1C: No results for input(s): HGBA1C in the last 72 hours. CBG:  Recent Labs Lab 01/24/16 0845 01/24/16 1242 01/24/16 1701 01/24/16 2252 01/25/16 0727  GLUCAP 98 116* 100* 114* 106*   Lipid Profile: No results for input(s): CHOL, HDL, LDLCALC, TRIG, CHOLHDL, LDLDIRECT in the last 72 hours. Thyroid Function Tests: No results for input(s): TSH, T4TOTAL, FREET4, T3FREE, THYROIDAB in the last 72 hours. Anemia Panel: No results for input(s): VITAMINB12, FOLATE, FERRITIN, TIBC, IRON, RETICCTPCT in the last 72 hours. Urine analysis:    Component Value Date/Time   COLORURINE AMBER (A) 01/09/2016 1507   APPEARANCEUR CLOUDY (A) 01/29/2016 1507   LABSPEC 1.018 01/29/2016 1507   PHURINE 5.5 01/22/2016 1507   GLUCOSEU NEGATIVE 01/12/2016 1507   HGBUR SMALL (A) 01/16/2016 1507   BILIRUBINUR SMALL (A) 01/28/2016 1507   KETONESUR NEGATIVE 01/17/2016 1507   PROTEINUR NEGATIVE 01/17/2016 1507   NITRITE NEGATIVE 01/11/2016 1507   LEUKOCYTESUR NEGATIVE 01/31/2016 1507   Sepsis Labs: @LABRCNTIP (procalcitonin:4,lacticidven:4)  ) Recent Results (from the past 240  hour(s))  Culture, blood (routine x 2)     Status: None (Preliminary result)   Collection Time: 01/16/2016 12:48 PM  Result Value Ref Range Status   Specimen Description BLOOD LEFT ANTECUBITAL  Final   Special Requests BOTTLES DRAWN AEROBIC AND ANAEROBIC 5 CC EA  Final   Culture   Final    NO GROWTH < 24 HOURS Performed at [facility]    Report Status PENDING  Incomplete  Culture, blood (routine x 2)     Status: None (Preliminary  result)   Collection Time: 02/01/2016 12:48 PM  Result Value Ref Range Status   Specimen Description BLOOD LEFT ANTECUBITAL  Final   Special Requests BOTTLES DRAWN AEROBIC AND ANAEROBIC 5 CC EA  Final   Culture  Setup Time   Final    GRAM NEGATIVE RODS ANAEROBIC BOTTLE ONLY CRITICAL RESULT CALLED TO, READ BACK BY AND VERIFIED WITH: Haze Boyden, PHARM AT 0813 ON [id number] BY Lucienne Capers    Culture   Final    TOO YOUNG TO READ Performed at [facility]    Report Status PENDING  Incomplete  Blood Culture ID Panel (Reflexed)     Status: None   Collection Time: 02/01/2016 12:48 PM  Result Value Ref Range Status   Enterococcus species NOT DETECTED NOT DETECTED Final   Listeria monocytogenes NOT DETECTED NOT DETECTED Final   Staphylococcus species NOT DETECTED NOT DETECTED Final   Staphylococcus aureus NOT DETECTED NOT DETECTED Final   Streptococcus species NOT DETECTED NOT DETECTED Final   Streptococcus agalactiae NOT DETECTED NOT DETECTED Final   Streptococcus pneumoniae NOT DETECTED NOT DETECTED Final   Streptococcus pyogenes NOT DETECTED NOT DETECTED Final   Acinetobacter baumannii NOT DETECTED NOT DETECTED Final   Enterobacteriaceae species NOT DETECTED NOT DETECTED Final   Enterobacter cloacae complex NOT DETECTED NOT DETECTED Final   Escherichia coli NOT DETECTED NOT DETECTED Final   Klebsiella oxytoca NOT DETECTED NOT DETECTED Final   Klebsiella pneumoniae NOT DETECTED NOT DETECTED Final   Proteus species NOT DETECTED NOT DETECTED Final    Serratia marcescens NOT DETECTED NOT DETECTED Final   Haemophilus influenzae NOT DETECTED NOT DETECTED Final   Neisseria meningitidis NOT DETECTED NOT DETECTED Final   Pseudomonas aeruginosa NOT DETECTED NOT DETECTED Final   Candida albicans NOT DETECTED NOT DETECTED Final   Candida glabrata NOT DETECTED NOT DETECTED Final   Candida krusei NOT DETECTED NOT DETECTED Final   Candida parapsilosis NOT DETECTED NOT DETECTED Final   Candida tropicalis NOT DETECTED NOT DETECTED Final    Comment: Performed at [facility]  Culture, Urine     Status: None   Collection Time: 01/06/2016  5:16 PM  Result Value Ref Range Status   Specimen Description URINE, RANDOM  Final   Special Requests NONE  Final   Culture NO GROWTH Performed at [facility]   Final   Report Status 01/24/2016 FINAL  Final  MRSA PCR Screening     Status: None   Collection Time: 01/10/2016  5:21 PM  Result Value Ref Range Status   MRSA by PCR NEGATIVE NEGATIVE Final    Comment:        The GeneXpert MRSA Assay (FDA approved for NASAL specimens only), is one component of a comprehensive MRSA colonization surveillance program. It is not intended to diagnose MRSA infection nor to guide or monitor treatment for MRSA infections.      Radiology Studies: Ct Abdomen Pelvis Wo Contrast  Result Date: 01/24/2016 CLINICAL DATA:  75 year old male inpatient with a history of atrial fibrillation on Coumadin, CHF, admitted after being found down for 3 days in the bathtub, treated for sepsis and acute renal failure, now with acute respiratory failure, ileus and abdominal distention. Left lung opacities on chest radiograph. EXAM: CT CHEST, ABDOMEN AND PELVIS WITHOUT CONTRAST TECHNIQUE: Multidetector CT imaging of the chest, abdomen and pelvis was performed following the standard protocol without IV contrast. COMPARISON:  01/24/2016 abdominal radiographs and 01/28/2016 chest radiographs. 07/02/2014 CT abdomen/pelvis. FINDINGS:  Examination is  significantly limited by patient body habitus, streak artifact from the patient's upper extremities, motion artifact and lack of IV contrast. CT CHEST FINDINGS Cardiovascular: Mild cardiomegaly. No significant pericardial fluid/thickening. Mildly atherosclerotic nonaneurysmal thoracic aorta. Dilated main pulmonary artery (3.7 cm diameter). Mediastinum/Nodes: No discrete thyroid nodules. Unremarkable esophagus. No pathologically enlarged axillary, mediastinal or gross hilar lymph nodes, noting limited sensitivity for the detection of hilar adenopathy on this noncontrast study. Lungs/Pleura: No pneumothorax. Small layering right pleural effusion. No left pleural effusion. There is layering debris in the tracheal lumen at the level of the thoracic inlet. There is a mosaic attenuation throughout both lungs. There are patchy bandlike areas of consolidation with associated volume loss in the bilateral lower lobes, favor atelectasis. Thin parenchymal bands in the right middle lobe and lingula are most consistent with areas of postinfectious/ postinflammatory scarring. No lung masses or significant pulmonary nodules in the aerated portions of the lungs. Musculoskeletal: No aggressive appearing focal osseous lesions. Mild-to-moderate thoracic spondylosis. Anasarca. CT ABDOMEN PELVIS FINDINGS Hepatobiliary: There is relative hypertrophy of the left liver lobe and the liver surface is diffusely irregular, consistent with cirrhosis. No gross liver mass. Normal gallbladder with no radiopaque cholelithiasis. No biliary ductal dilatation. Pancreas: Normal, with no mass or duct dilation. Spleen: Normal size. No mass. Adrenals/Urinary Tract: No discrete adrenal nodules. No hydronephrosis. No renal stones. Simple 2.3 cm posterior upper right renal cyst. Simple 3.1 cm lateral interpolar left renal cyst. No additional contour deforming renal masses. Bladder is nearly collapsed by indwelling Foley catheter, with the  suggestion of diffuse bladder wall thickening. Stomach/Bowel: Grossly normal stomach. Normal caliber small bowel with no small bowel wall thickening. Faintly visualized normal appendix. There is mild dilatation throughout the large bowel with mild stool and scattered fluid levels in the large bowel. No large bowel wall thickening. Vascular/Lymphatic: Mildly atherosclerotic nonaneurysmal abdominal aorta. No gross pathologically enlarged lymph nodes in the abdomen or pelvis. Reproductive: Markedly enlarged prostate, not appreciably changed. Other: No pneumoperitoneum.  Large volume simple ascites. Musculoskeletal: No aggressive appearing focal osseous lesions. Moderate lumbar spondylosis. Anasarca. IMPRESSION: 1. Cirrhosis. No gross liver mass on this limited noncontrast CT study. Large volume ascites. Normal size spleen. 2. Mild diffuse large bowel dilatation with colonic fluid levels, consistent with mild colonic ileus. 3. Anasarca. 4. Cardiomegaly.  Small layering right pleural effusion. 5. Prominently dilated main pulmonary artery, likely indicating pulmonary arterial hypertension. Mosaic attenuation throughout the lungs could be due to mosaic perfusion from pulmonary vascular disease versus air trapping from small airways disease. 6. Patchy bandlike areas of consolidation with associated volume loss in the lower lungs, favor atelectasis. 7. Markedly enlarged prostate. 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 Bladder nearly collapsed by indwelling Foley catheter. No hydronephrosis. 8. Aortic atherosclerosis. Electronically Signed   By: Delbert Phenix M.D.   On: 01/24/2016 18:30   Ct Chest Wo Contrast  Result Date: 01/24/2016 CLINICAL DATA:  75 year old male inpatient with a history of atrial fibrillation on Coumadin, CHF, admitted after being found down for 3 days in the bathtub, treated for sepsis and acute renal failure, now with acute respiratory  failure, ileus and abdominal distention. Left lung opacities on chest radiograph. EXAM: CT CHEST, ABDOMEN AND PELVIS WITHOUT CONTRAST TECHNIQUE: Multidetector CT imaging of the chest, abdomen and pelvis was performed following the standard protocol without IV contrast. COMPARISON:  01/24/2016 abdominal radiographs and 01/04/2016 chest radiographs. 07/02/2014 CT abdomen/pelvis. FINDINGS: Examination is significantly limited by patient body habitus, streak artifact  from the patient's upper extremities, motion artifact and lack of IV contrast. CT CHEST FINDINGS Cardiovascular: Mild cardiomegaly. No significant pericardial fluid/thickening. Mildly atherosclerotic nonaneurysmal thoracic aorta. Dilated main pulmonary artery (3.7 cm diameter). Mediastinum/Nodes: No discrete thyroid nodules. Unremarkable esophagus. No pathologically enlarged axillary, mediastinal or gross hilar lymph nodes, noting limited sensitivity for the detection of hilar adenopathy on this noncontrast study. Lungs/Pleura: No pneumothorax. Small layering right pleural effusion. No left pleural effusion. There is layering debris in the tracheal lumen at the level of the thoracic inlet. There is a mosaic attenuation throughout both lungs. There are patchy bandlike areas of consolidation with associated volume loss in the bilateral lower lobes, favor atelectasis. Thin parenchymal bands in the right middle lobe and lingula are most consistent with areas of postinfectious/ postinflammatory scarring. No lung masses or significant pulmonary nodules in the aerated portions of the lungs. Musculoskeletal: No aggressive appearing focal osseous lesions. Mild-to-moderate thoracic spondylosis. Anasarca. CT ABDOMEN PELVIS FINDINGS Hepatobiliary: There is relative hypertrophy of the left liver lobe and the liver surface is diffusely irregular, consistent with cirrhosis. No gross liver mass. Normal gallbladder with no radiopaque cholelithiasis. No biliary ductal  dilatation. Pancreas: Normal, with no mass or duct dilation. Spleen: Normal size. No mass. Adrenals/Urinary Tract: No discrete adrenal nodules. No hydronephrosis. No renal stones. Simple 2.3 cm posterior upper right renal cyst. Simple 3.1 cm lateral interpolar left renal cyst. No additional contour deforming renal masses. Bladder is nearly collapsed by indwelling Foley catheter, with the suggestion of diffuse bladder wall thickening. Stomach/Bowel: Grossly normal stomach. Normal caliber small bowel with no small bowel wall thickening. Faintly visualized normal appendix. There is mild dilatation throughout the large bowel with mild stool and scattered fluid levels in the large bowel. No large bowel wall thickening. Vascular/Lymphatic: Mildly atherosclerotic nonaneurysmal abdominal aorta. No gross pathologically enlarged lymph nodes in the abdomen or pelvis. Reproductive: Markedly enlarged prostate, not appreciably changed. Other: No pneumoperitoneum.  Large volume simple ascites. Musculoskeletal: No aggressive appearing focal osseous lesions. Moderate lumbar spondylosis. Anasarca. IMPRESSION: 1. Cirrhosis. No gross liver mass on this limited noncontrast CT study. Large volume ascites. Normal size spleen. 2. Mild diffuse large bowel dilatation with colonic fluid levels, consistent with mild colonic ileus. 3. Anasarca. 4. Cardiomegaly.  Small layering right pleural effusion. 5. Prominently dilated main pulmonary artery, likely indicating pulmonary arterial hypertension. Mosaic attenuation throughout the lungs could be due to mosaic perfusion from pulmonary vascular disease versus air trapping from small airways disease. 6. Patchy bandlike areas of consolidation with associated volume loss in the lower lungs, favor atelectasis. 7. Markedly enlarged prostate. 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 Bladder nearly collapsed by indwelling Foley catheter. No  hydronephrosis. 8. Aortic atherosclerosis. Electronically Signed   By: Delbert PhenixJason A Poff M.D.   On: 01/24/2016 18:30   Dg Abd Acute W/chest  Result Date: 01/04/2016 CLINICAL DATA:  75 year old male with abdominal distention. Initial encounter. EXAM: DG ABDOMEN ACUTE W/ 1V CHEST COMPARISON:  CT Abdomen and Pelvis 06/24/2014 and earlier. FINDINGS: Chronic cardiomegaly appears not significantly changed since 2011. However, there is abnormal increased left hilar and retrocardiac density. No pneumothorax or pleural effusion. Crowding of lung markings elsewhere. No edema suspected. Large body habitus. No pneumoperitoneum. Supine and left-side-down lateral decubitus views of the abdomen are provided. Mild to moderate gaseous distension of the stomach and throughout the large bowel. Several gas-filled but nondilated small bowel loops are noted. Paucity of gas in the rectum. Furthermore there is a  hazy gray opacity throughout the abdomen. This can be seen with ascites. No acute or suspicious osseous lesion identified. IMPRESSION: 1. Abnormal increased left hilar and retrocardiac density in the chest. PA and lateral chest radiographs would be helpful when possible. Failing that, Chest CT (IV contrast preferred) would be recommended. 2. Wallace Cullens hazy opacity throughout the abdomen suggesting Ascites. 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 3. Gas distended stomach and large bowel. Paucity of gas in the distal sigmoid colon and rectum. Small bowel loops nondilated at this time. Differential considerations include ileus and distal colon obstruction. Note that the prostate was severely enlarged on the 2016 CT Abdomen and Pelvis. Electronically Signed   By: Odessa Fleming M.D.   On: 01/19/2016 12:03   Dg Abd Portable 1v  Result Date: 01/24/2016 CLINICAL DATA:  Ileus EXAM: PORTABLE ABDOMEN - 1 VIEW COMPARISON:  01/29/2016 FINDINGS: Scattered large and small bowel gas is noted. Gas distension of the  colon is again identified and stable from the prior exam. The overall appearance is similar to that seen on the previous day. No free air is noted. Foley catheter is noted. IMPRESSION: Changes consistent with a colonic ileus. Correlation with physical exam is recommended. No significant change from the previous day is seen. Electronically Signed   By: Alcide Clever M.D.   On: 01/24/2016 07:16    Scheduled Meds: . atorvastatin  40 mg Oral q1800  . lidocaine  1 application Urethral Once  . piperacillin-tazobactam (ZOSYN)  IV  2.25 g Intravenous Q6H  . prothrombin complex conc human (Kcentra) IVPB  5,000 Units Intravenous STAT  . vancomycin  1,500 mg Intravenous Q48H   Continuous Infusions: . norepinephrine (LEVOPHED) Adult infusion 4 mcg/min (01/25/16 0805)  . octreotide  (SANDOSTATIN)    IV infusion 50 mcg/hr (01/25/16 0804)  . pantoprozole (PROTONIX) infusion 8 mg/hr (01/25/16 0820)     LOS: 2 days   Time spent: 35 minutes.  Hazeline Junker, MD [facility] Pager [phone]  If 7PM-7AM, please contact night-coverage [email or web address] Password [email or web address] 01/25/2016, 8:45 AM

## 2016-01-25 NOTE — Progress Notes (Signed)
Asked to evaluate the patient for acute onset coffee ground emesis.  The patient complained of "indigestion" earlier tonight then vomited shortly after receiving an oral dose of protonix per RN.  Dr. Selena BattenKim is providing floor coverage and has already ordered STAT labs including repeat INR.  INR level was 6.77 earlier today; oral vitamin K was given then.  The patient has his eyes closed but is alert and voices understanding of what is going on.  NG tube placed for decompression.  At least 500cc of coffee ground emesis has already come out.  The patient has soft blood pressures, systolics in the 80's, MAPs mid 50's.  Irregular heart rhythm is rate controlled.  He does not have an oxygen requirement at this time.  Lungs are clear.  Abdomen is distended but compressible (actually improved per RN).  He has generalized edema at baseline.  The patient is a Jehovah's witness and would not want a blood transfusion.  He has voiced an understanding that consequences of active bleeding without transfusion include progressive anemia and possibly even death.  He has also confirmed that he has a DO NOT RESUSCITATE status.  I anticipate he will need additional vitamin K (likely IV) in the setting of active bleeding.  Follow H/H.  Agree with IV protonix 80mg  bolus then infusion per protocol.  Will need GI consult in the AM.

## 2016-01-25 NOTE — Progress Notes (Signed)
CRITICAL VALUE ALERT  Critical value received:  INR 4.53  Date of notification:  01/25/16  Time of notification:  0844  Critical value read back:Yes.    Nurse who received alert:  Heloise PurpuraSusan Kiamesha Samet RN  MD notified (1st page):  Dr Jarvis NewcomerGrunz  Time of first page:  0845  MD notified (2nd page):  Time of second page:  Responding MD:  Dr Jarvis NewcomerGrunz  Time MD responded:  [phone]

## 2016-01-25 NOTE — Progress Notes (Signed)
PT Cancellation Note  Patient Details Name: Vincent HatchetJohn W Gibson MRN: [id number] DOB: May 14, 1940   Cancelled Treatment:    Reason Eval/Treat Not Completed: Medical issues which prohibited therapy (RN requested no PT today 2* hypotension. Will follow. )   Tamala SerUhlenberg, Ifrah Vest Kistler 01/25/2016, 11:10 AM [phone]

## 2016-01-25 NOTE — Consult Note (Signed)
PCCM CONSULT NOTE  Admission date: 01/13/2016 Consult date: 01/25/2016 Referring provider: Dr. 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  was taking bath at home, but was unable to get out of tube.  He stayed in tube for 3 days before member from church checked on him.  He was brought to ER and found to have low temperature, elevated creatinine, low blood sugar, urine retention, Rt leg cellulitis, elevated lactic acid level, elevated INR, and mild hypoxia.  He also had an ileus.  He was started on Abx, IV fluids, and had urology consulted to place foley catheter.  He was given vitamin K.  Speech therapy saw him and he was started on D3 diet.  He developed coffee ground emesis and worsening abdominal distention.  He had NG tube placed.  He was started on protonix and octreotide gtt.  GI was consulted.  He was noted to have changes of cirrhosis of liver on CT abdomen.  He denies hx of alcohol abuse.    He had low blood pressure.  He is Jehovah's witness and therefore no blood products.  He was given IV albumin.  His blood pressure remained low and he was started on levophed.  He denies chest pain, or dyspnea.  Abdominal discomfort is better after NG tube placement.  He has chronic swelling of his legs.  PMHx: He  has a past medical history of A-fib [facility]); CHF (congestive heart failure) (HCC); Enlarged heart; Hyperlipemia; and Hypertension.  PSHx: He  has no past surgical history on file.  FHx:  His family history is negative for kuru.  SHx: He  reports that he has never smoked. He has never used smokeless tobacco. He reports that he does not drink alcohol or use drugs.   No current facility-administered medications on file prior to encounter.    Current Outpatient Prescriptions on File Prior to Encounter  Medication Sig  . atorvastatin (LIPITOR) 40 MG tablet Take 40 mg by mouth daily as needed (high cholesterol).  . furosemide (LASIX) 40 MG  tablet Take 40 mg by mouth daily as needed for fluid or edema.  . irbesartan (AVAPRO) 300 MG tablet Take 300 mg by mouth daily.   . metoprolol succinate (TOPROL-XL) 50 MG 24 hr tablet Take 50 mg by mouth daily.   Marland Kitchen warfarin (COUMADIN) 5 MG tablet Take 2.5 mg by mouth daily.     No Known Allergies  ROS: Negative except above  Vital signs: BP (!) 78/35 (BP Location: Right Arm)   Pulse 87   Temp 97.3 F (36.3 C) (Oral)   Resp 13   Ht 5\' 11"  (1.803 m)   Wt 286 lb 2.5 oz (129.8 kg)   SpO2 98%   BMI 39.91 kg/m   Intake/output: I/O last 3 completed shifts: In: 1512.5 [I.V.:812.5; IV Piggyback:700] Out: 2350 [Urine:1550; Emesis/NG output:800]  General: alert Neuro: normal strength, CN intact, moves all extremities HEENT: pupils reactive, NG tube in place, no stridor Cardiac: regular, no murmur Chest: no wheeze, decreased BS Abd: soft, non tender, decreased BS Ext: 2+ non pitting edema Skin: chronic stasis ulcers of lower legs b/l   CMP Latest Ref Rng & Units 01/25/2016 01/24/2016 01/14/2016  Glucose 65 - 99 mg/dL 161(W) 960(A) 90  BUN 6 - 20 mg/dL 54(U) 98(J) 19(J)  Creatinine 0.61 - 1.24 mg/dL 4.78(G) 9.56(O) 1.30(Q)  Sodium 135 - 145 mmol/L 145 143 143  Potassium 3.5 - 5.1 mmol/L 3.4(L) 3.5 4.3  Chloride 101 - 111 mmol/L 113(H) 110 105  CO2 22 - 32 mmol/L 24 23 21(L)  Calcium 8.9 - 10.3 mg/dL 6.9(L) 7.9(L) 8.9  Total Protein 6.5 - 8.1 g/dL 1.6(X) - 7.4  Total Bilirubin 0.3 - 1.2 mg/dL 0.9(U) - 4.7(H)  Alkaline Phos 38 - 126 U/L 34(L) - 94  AST 15 - 41 U/L 25 - 65(H)  ALT 17 - 63 U/L 16(L) - 29    CBC Latest Ref Rng & Units 01/25/2016 01/24/2016 01/10/2016  WBC 4.0 - 10.5 K/uL 17.2(H) 15.5(H) 13.3(H)  Hemoglobin 13.0 - 17.0 g/dL 0.4(V) 11.0(L) 11.9(L)  Hematocrit 39.0 - 52.0 % 28.6(L) 32.2(L) 34.8(L)  Platelets 150 - 400 K/uL 120(L) 150 205    Lab Results  Component Value Date   INR 1.29 01/25/2016   INR 4.53 (HH) 01/25/2016   INR 10.23 (HH) 01/24/2016     CBG (last 3)   Recent Labs  01/24/16 1701 01/24/16 2252 01/25/16 0727  GLUCAP 100* 114* 106*     Ct Abdomen Pelvis Wo Contrast  Result Date: 01/24/2016 CLINICAL DATA:  75 year old male inpatient with a history of atrial fibrillation on Coumadin, CHF, admitted after being found down for 3 days in the bathtub, treated for sepsis and acute renal failure, now with acute respiratory failure, ileus and abdominal distention. Left lung opacities on chest radiograph. EXAM: CT CHEST, ABDOMEN AND PELVIS WITHOUT CONTRAST TECHNIQUE: Multidetector CT imaging of the chest, abdomen and pelvis was performed following the standard protocol without IV contrast. COMPARISON:  01/24/2016 abdominal radiographs and 01/16/2016 chest radiographs. 07/02/2014 CT abdomen/pelvis. FINDINGS: Examination is significantly limited by patient body habitus, streak artifact from the patient's upper extremities, motion artifact and lack of IV contrast. CT CHEST FINDINGS Cardiovascular: Mild cardiomegaly. No significant pericardial fluid/thickening. Mildly atherosclerotic nonaneurysmal thoracic aorta. Dilated main pulmonary artery (3.7 cm diameter). Mediastinum/Nodes: No discrete thyroid nodules. Unremarkable esophagus. No pathologically enlarged axillary, mediastinal or gross hilar lymph nodes, noting limited sensitivity for the detection of hilar adenopathy on this noncontrast study. Lungs/Pleura: No pneumothorax. Small layering right pleural effusion. No left pleural effusion. There is layering debris in the tracheal lumen at the level of the thoracic inlet. There is a mosaic attenuation throughout both lungs. There are patchy bandlike areas of consolidation with associated volume loss in the bilateral lower lobes, favor atelectasis. Thin parenchymal bands in the right middle lobe and lingula are most consistent with areas of postinfectious/ postinflammatory scarring. No lung masses or significant pulmonary nodules in the aerated  portions of the lungs. Musculoskeletal: No aggressive appearing focal osseous lesions. Mild-to-moderate thoracic spondylosis. Anasarca. CT ABDOMEN PELVIS FINDINGS Hepatobiliary: There is relative hypertrophy of the left liver lobe and the liver surface is diffusely irregular, consistent with cirrhosis. No gross liver mass. Normal gallbladder with no radiopaque cholelithiasis. No biliary ductal dilatation. Pancreas: Normal, with no mass or duct dilation. Spleen: Normal size. No mass. Adrenals/Urinary Tract: No discrete adrenal nodules. No hydronephrosis. No renal stones. Simple 2.3 cm posterior upper right renal cyst. Simple 3.1 cm lateral interpolar left renal cyst. No additional contour deforming renal masses. Bladder is nearly collapsed by indwelling Foley catheter, with the suggestion of diffuse bladder wall thickening. Stomach/Bowel: Grossly normal stomach. Normal caliber small bowel with no small bowel wall thickening. Faintly visualized normal appendix. There is mild dilatation throughout the large bowel with mild stool and scattered fluid levels in the large bowel. No large bowel wall thickening. Vascular/Lymphatic: Mildly atherosclerotic nonaneurysmal abdominal aorta. No gross pathologically enlarged lymph nodes in the  abdomen or pelvis. Reproductive: Markedly enlarged prostate, not appreciably changed. Other: No pneumoperitoneum.  Large volume simple ascites. Musculoskeletal: No aggressive appearing focal osseous lesions. Moderate lumbar spondylosis. Anasarca. IMPRESSION: 1. Cirrhosis. No gross liver mass on this limited noncontrast CT study. Large volume ascites. Normal size spleen. 2. Mild diffuse large bowel dilatation with colonic fluid levels, consistent with mild colonic ileus. 3. Anasarca. 4. Cardiomegaly.  Small layering right pleural effusion. 5. Prominently dilated main pulmonary artery, likely indicating pulmonary arterial hypertension. Mosaic attenuation throughout the lungs could be due to  mosaic perfusion from pulmonary vascular disease versus air trapping from small airways disease. 6. Patchy bandlike areas of consolidation with associated volume loss in the lower lungs, favor atelectasis. 7. Markedly enlarged prostate. 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 Bladder nearly collapsed by indwelling Foley catheter. No hydronephrosis. 8. Aortic atherosclerosis. Electronically Signed   By: Delbert PhenixJason A Poff M.D.   On: 01/24/2016 18:30   Ct Chest Wo Contrast  Result Date: 01/24/2016 CLINICAL DATA:  75 year old male inpatient with a history of atrial fibrillation on Coumadin, CHF, admitted after being found down for 3 days in the bathtub, treated for sepsis and acute renal failure, now with acute respiratory failure, ileus and abdominal distention. Left lung opacities on chest radiograph. EXAM: CT CHEST, ABDOMEN AND PELVIS WITHOUT CONTRAST TECHNIQUE: Multidetector CT imaging of the chest, abdomen and pelvis was performed following the standard protocol without IV contrast. COMPARISON:  01/24/2016 abdominal radiographs and 01/19/2016 chest radiographs. 07/02/2014 CT abdomen/pelvis. FINDINGS: Examination is significantly limited by patient body habitus, streak artifact from the patient's upper extremities, motion artifact and lack of IV contrast. CT CHEST FINDINGS Cardiovascular: Mild cardiomegaly. No significant pericardial fluid/thickening. Mildly atherosclerotic nonaneurysmal thoracic aorta. Dilated main pulmonary artery (3.7 cm diameter). Mediastinum/Nodes: No discrete thyroid nodules. Unremarkable esophagus. No pathologically enlarged axillary, mediastinal or gross hilar lymph nodes, noting limited sensitivity for the detection of hilar adenopathy on this noncontrast study. Lungs/Pleura: No pneumothorax. Small layering right pleural effusion. No left pleural effusion. There is layering debris in the tracheal lumen at the level of the thoracic  inlet. There is a mosaic attenuation throughout both lungs. There are patchy bandlike areas of consolidation with associated volume loss in the bilateral lower lobes, favor atelectasis. Thin parenchymal bands in the right middle lobe and lingula are most consistent with areas of postinfectious/ postinflammatory scarring. No lung masses or significant pulmonary nodules in the aerated portions of the lungs. Musculoskeletal: No aggressive appearing focal osseous lesions. Mild-to-moderate thoracic spondylosis. Anasarca. CT ABDOMEN PELVIS FINDINGS Hepatobiliary: There is relative hypertrophy of the left liver lobe and the liver surface is diffusely irregular, consistent with cirrhosis. No gross liver mass. Normal gallbladder with no radiopaque cholelithiasis. No biliary ductal dilatation. Pancreas: Normal, with no mass or duct dilation. Spleen: Normal size. No mass. Adrenals/Urinary Tract: No discrete adrenal nodules. No hydronephrosis. No renal stones. Simple 2.3 cm posterior upper right renal cyst. Simple 3.1 cm lateral interpolar left renal cyst. No additional contour deforming renal masses. Bladder is nearly collapsed by indwelling Foley catheter, with the suggestion of diffuse bladder wall thickening. Stomach/Bowel: Grossly normal stomach. Normal caliber small bowel with no small bowel wall thickening. Faintly visualized normal appendix. There is mild dilatation throughout the large bowel with mild stool and scattered fluid levels in the large bowel. No large bowel wall thickening. Vascular/Lymphatic: Mildly atherosclerotic nonaneurysmal abdominal aorta. No gross pathologically enlarged lymph nodes in the abdomen or pelvis. Reproductive: Markedly enlarged prostate, not  appreciably changed. Other: No pneumoperitoneum.  Large volume simple ascites. Musculoskeletal: No aggressive appearing focal osseous lesions. Moderate lumbar spondylosis. Anasarca. IMPRESSION: 1. Cirrhosis. No gross liver mass on this limited  noncontrast CT study. Large volume ascites. Normal size spleen. 2. Mild diffuse large bowel dilatation with colonic fluid levels, consistent with mild colonic ileus. 3. Anasarca. 4. Cardiomegaly.  Small layering right pleural effusion. 5. Prominently dilated main pulmonary artery, likely indicating pulmonary arterial hypertension. Mosaic attenuation throughout the lungs could be due to mosaic perfusion from pulmonary vascular disease versus air trapping from small airways disease. 6. Patchy bandlike areas of consolidation with associated volume loss in the lower lungs, favor atelectasis. 7. Markedly enlarged prostate. 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 Bladder nearly collapsed by indwelling Foley catheter. No hydronephrosis. 8. Aortic atherosclerosis. Electronically Signed   By: Delbert Phenix M.D.   On: 01/24/2016 18:30   Dg Chest Port 1 View  Result Date: 01/25/2016 CLINICAL DATA:  Respiratory distress,  hypotension EXAM: PORTABLE CHEST 1 VIEW COMPARISON:  10/20/ 17 FINDINGS: Cardiomegaly is noted. There is NG tube coiled within stomach with tip in proximal stomach. Elevation of the right hemidiaphragm again noted. Persistent streaky left base retrocardiac atelectasis or infiltrate. IMPRESSION: There is NG tube coiled within stomach with tip in proximal stomach. Elevation of the right hemidiaphragm again noted. Persistent streaky left base retrocardiac atelectasis or infiltrate. Electronically Signed   By: Natasha Mead M.D.   On: 01/25/2016 10:23   Dg Abd Acute W/chest  Result Date: 01/06/2016 CLINICAL DATA:  75 year old male with abdominal distention. Initial encounter. EXAM: DG ABDOMEN ACUTE W/ 1V CHEST COMPARISON:  CT Abdomen and Pelvis 06/24/2014 and earlier. FINDINGS: Chronic cardiomegaly appears not significantly changed since 2011. However, there is abnormal increased left hilar and retrocardiac density. No pneumothorax or pleural  effusion. Crowding of lung markings elsewhere. No edema suspected. Large body habitus. No pneumoperitoneum. Supine and left-side-down lateral decubitus views of the abdomen are provided. Mild to moderate gaseous distension of the stomach and throughout the large bowel. Several gas-filled but nondilated small bowel loops are noted. Paucity of gas in the rectum. Furthermore there is a hazy gray opacity throughout the abdomen. This can be seen with ascites. No acute or suspicious osseous lesion identified. IMPRESSION: 1. Abnormal increased left hilar and retrocardiac density in the chest. PA and lateral chest radiographs would be helpful when possible. Failing that, Chest CT (IV contrast preferred) would be recommended. 2. Wallace Cullens hazy opacity throughout the abdomen suggesting Ascites. 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 3. Gas distended stomach and large bowel. Paucity of gas in the distal sigmoid colon and rectum. Small bowel loops nondilated at this time. Differential considerations include ileus and distal colon obstruction. Note that the prostate was severely enlarged on the 2016 CT Abdomen and Pelvis. Electronically Signed   By: Odessa Fleming M.D.   On: 01/22/2016 12:03   Dg Abd Portable 1v  Result Date: 01/25/2016 CLINICAL DATA:  Check nasogastric catheter placement EXAM: PORTABLE ABDOMEN - 1 VIEW COMPARISON:  01/24/2016 FINDINGS: Scattered large and small bowel gas is noted. The degree of gaseous distension of the colon is stable. A nasogastric catheter is now seen within the stomach. No free air is seen. IMPRESSION: New nasogastric catheter within the stomach.  Stable colonic ileus. Electronically Signed   By: Alcide Clever M.D.   On: 01/25/2016 10:23   Dg Abd Portable 1v  Result  Date: 01/24/2016 CLINICAL DATA:  Ileus EXAM: PORTABLE ABDOMEN - 1 VIEW COMPARISON:  01-29-16 FINDINGS: Scattered large and small bowel gas is noted. Gas distension of the colon is again  identified and stable from the prior exam. The overall appearance is similar to that seen on the previous day. No free air is noted. Foley catheter is noted. IMPRESSION: Changes consistent with a colonic ileus. Correlation with physical exam is recommended. No significant change from the previous day is seen. Electronically Signed   By: Alcide Clever M.D.   On: 01/24/2016 07:16    Studies: Echo 12/09/15 >> mild LVH, EF 45 to 50%, mild MR, mod LA dilation, mild/mod RV systolic dysfx, PAS 48 mmHg Doppler Rt arm 10/21 >> no DVT Doppler Rt leg 10/21 >> no DVT CT chest 10/21 >> small pleural effusions, mosaic pattern, ATX, RML scarring CT abd/pelvis 10/21 >> changes of liver cirrhosis, enlarged prostate, colonic ileus  Cultures: Blood 10/20 >> GNR >>  Urine 10/20 >> negative  Antibiotics: Vancomycin 10/20 >> Zosyn 10/20 >>  Events: 10/20 Admit, urology consulted for foley 10/22 Coffee ground emesis, GI consulted, given Kcentra, started levophed  Summary: 75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  Admitted with AKI, hypotension, hypothermia, hypoglycemia, mild rhabdomyolysis, urine retention, ileus, cellulitis.  Found to have GNR bacteremia.  Developed progressive hypotension and started on pressors.  Found to have cirrhosis of liver changes on CT abdomen.  Developed coffee ground emesis.  He has third spacing of fluid, but likely has intravascular volume depletion.  Assessment/plan:  Shock 2nd to hypovolemia, GI bleeding and sepsis. - continue IV fluids - check cortisol - continue pressors to keep SBP > 85 - avoid CVL in setting of coagulopathy  Upper GI bleeding. Changes of cirrhosis on CT abdomen. Ileus. - NPO - octreotide, protonix gtt - NG tube to suction - GI consulted  Septic shock with lower extremity cellulitis and GNR bacteremia. - Day 3 of Abx  Lactic acidosis likely from sepsis and liver disease. - f/u lactic  acid  AKI >> baselines creatinine 1.54 from 11/21/15. Urine retention with hx of BPH >> had foley placed by urology. - optimize hemodynamics - keep foley for now  Hx of combined CHF, WHO group 2 and 3 pulmonary hypertension. Hx of A fib. - monitor hemodynamics  Acute blood loss anemia. Elevated INR from coumadin therapy, and liver disease. - Jehovah's Witness >> NO BLOOD PRODUCTS - limit blood draws as able   DVT prophylaxis - SCDs SUP - Protonix gtt Goals of care - DNR/DNI  CC time 36 minutes.  Coralyn Helling, MD [facility] Pulmonary/Critical Care 01/25/2016, 12:07 PM Pager:  [phone] After 3pm call: [phone]

## 2016-01-25 NOTE — Progress Notes (Signed)
  Late entry, Pt c/o indigestion paged MD Selena BattenKim order for Po Protonix given, shortly after pt had increased nausea, vomiting and diarrhea  200 cc of coffee ground emesis noted with abd distension, order given for NG tube, Pt had instant relief will continue to monitor.

## 2016-01-25 NOTE — Consult Note (Signed)
WOC Nurse wound consult note Reason for Consult: Patient admitted 48 hours ago after being found down in bathtub for 3 days.  He was unable to get out of tub.  RLE with cellulitis, LLE with full thickness wound originating with fluid-filled blister).  Several intact and recently ruptured blisters noted on right upper extremity and right posterior chest. Deep tissue pressure injury (DTPI) noted at the sacrum, inside the gluteal cleft. Wound type: Pressure, moisture, venous insufficiency, infection Pressure Ulcer POA: Yes Measurement:sacral:  3cm x 2cm x 1.5cm with deep red/maroon wound bed. Peeling epidermis at periphery consistent with dep tissue pressure injury. LLE with ruptured blister measuring 3.5cm x 2.5cm x 0.1cm, pink, moist wound bed.  Right posterior check with ruptured blister measuring 1.6cm round x 0.1cm with moist, pink wound bed.  Right UE with ruptured and intact blisters, the largest of which measures 2cm round (intact). Wound bed:See above Drainage (amount, consistency, odor) See above. Periwound: Upper body is dry and intact.  Bilateral LEs are warm to the touch with edema L>R.  Dry skin in plaques is apparent and Nursing has been doing an excellent job of moisturizing.  The previously noted erythema?cellulitis of the right LE is improving, I.e., redness is retreating below the line of demarcation drawn on admission. Dressing procedure/placement/frequency: Patient is on a therapeutic mattress with low air loss feature and I have arranged for this to continue even if patient is transferred to floor.  Turning and repositioning is in place and will continue, minimizing time spent in the supine position.  Bilateral heel boots are provided to elevate heels and assist with posterior left LE wound healing. Silicone foam dressings are to cover the ruptured and intact blisters.  The sacral deep tissue pressure injury will be treated topically with a calcium alginate dressing and covered with aq  silicone foam dressing.  The avoidance of supine positioning and the mattress with low air loss feature will support the tissues until the true depth of this ulceration is revealed. The critical illness of this patient is noted.  While I will not follow routinely, I will remain available to reassess the sacral wound and any other integumentary concerns at any time desired by either the Nursing or any member of the medical team.  Please do not hesitate to call if any concerns arise relative to his skin not progressing in a manner consistent with his overall condition. Thank you for this consultation. Ladona MowLaurie Jaelani Posa, MSN, RN, GNP, Hans EdenCWOCN, CWON-AP, FAAN  Pager# [phone]

## 2016-01-25 NOTE — Progress Notes (Signed)
Peripherally Inserted Central Catheter/Midline Placement  The IV Nurse has discussed with the patient and/or persons authorized to consent for the patient, the purpose of this procedure and the potential benefits and risks involved with this procedure.  The benefits include less needle sticks, lab draws from the catheter, and the patient may be discharged home with the catheter. Risks include, but not limited to, infection, bleeding, blood clot (thrombus formation), and puncture of an artery; nerve damage and irregular heartbeat and possibility to perform a PICC exchange if needed/ordered by physician.  Alternatives to this procedure were also discussed.  Bard Power PICC patient education guide, fact sheet on infection prevention and patient information card has been provided to patient /or left at bedside.  Pt requested that his POA sign the consent.  Pt gave verbal consent for PICC placement but hands are very edematous and difficult to hold a pen.    PICC/Midline Placement Documentation  PICC Double Lumen 01/25/16 PICC Right Basilic 43 cm 0 cm (Active)  Indication for Insertion or Continuance of Line Vasoactive infusions;Poor Vasculature-patient has had multiple peripheral attempts or PIVs lasting less than 24 hours;Limited venous access - need for IV therapy >5 days (PICC only) 01/25/2016  4:54 PM  Exposed Catheter (cm) 0 cm 01/25/2016  4:54 PM  Site Assessment Clean;Dry;Intact 01/25/2016  4:54 PM  Lumen #1 Status Flushed;Saline locked;Blood return noted 01/25/2016  4:54 PM  Lumen #2 Status Flushed;Saline locked;Blood return noted 01/25/2016  4:54 PM  Dressing Type Transparent 01/25/2016  4:54 PM  Dressing Status Clean;Dry;Intact;Antimicrobial disc in place 01/25/2016  4:54 PM  Line Care Connections checked and tightened 01/25/2016  4:54 PM  Line Adjustment (NICU/IV Team Only) No 01/25/2016  4:54 PM  Dressing Intervention New dressing 01/25/2016  4:54 PM  Dressing Change Due 02/01/16 01/25/2016   4:54 PM       Elliot Dallyiggs, Josue Kass Wright 01/25/2016, 4:55 PM

## 2016-01-26 ENCOUNTER — Encounter (HOSPITAL_COMMUNITY): Payer: Self-pay | Admitting: Internal Medicine

## 2016-01-26 DIAGNOSIS — L89001 Pressure ulcer of unspecified elbow, stage 1: Secondary | ICD-10-CM

## 2016-01-26 DIAGNOSIS — R9431 Abnormal electrocardiogram [ECG] [EKG]: Secondary | ICD-10-CM

## 2016-01-26 LAB — GLUCOSE, CAPILLARY
GLUCOSE-CAPILLARY: 115 mg/dL — AB (ref 65–99)
GLUCOSE-CAPILLARY: 130 mg/dL — AB (ref 65–99)
Glucose-Capillary: 124 mg/dL — ABNORMAL HIGH (ref 65–99)

## 2016-01-26 LAB — PROTIME-INR
INR: 1.6
INR: 1.76
PROTHROMBIN TIME: 19.2 s — AB (ref 11.4–15.2)
Prothrombin Time: 20.8 seconds — ABNORMAL HIGH (ref 11.4–15.2)

## 2016-01-26 LAB — COMPREHENSIVE METABOLIC PANEL
ALBUMIN: 2.6 g/dL — AB (ref 3.5–5.0)
ALT: 20 U/L (ref 17–63)
ALT: 20 U/L (ref 17–63)
ANION GAP: 10 (ref 5–15)
AST: 21 U/L (ref 15–41)
AST: 21 U/L (ref 15–41)
Albumin: 2.4 g/dL — ABNORMAL LOW (ref 3.5–5.0)
Alkaline Phosphatase: 55 U/L (ref 38–126)
Alkaline Phosphatase: 51 U/L (ref 38–126)
Anion gap: 9 (ref 5–15)
BILIRUBIN TOTAL: 2.6 mg/dL — AB (ref 0.3–1.2)
BUN: 94 mg/dL — AB (ref 6–20)
BUN: 96 mg/dL — ABNORMAL HIGH (ref 6–20)
CHLORIDE: 111 mmol/L (ref 101–111)
CHLORIDE: 112 mmol/L — AB (ref 101–111)
CO2: 25 mmol/L (ref 22–32)
CO2: 25 mmol/L (ref 22–32)
Calcium: 8.4 mg/dL — ABNORMAL LOW (ref 8.9–10.3)
Calcium: 8.6 mg/dL — ABNORMAL LOW (ref 8.9–10.3)
Creatinine, Ser: 2.89 mg/dL — ABNORMAL HIGH (ref 0.61–1.24)
Creatinine, Ser: 3.01 mg/dL — ABNORMAL HIGH (ref 0.61–1.24)
GFR calc Af Amer: 23 mL/min — ABNORMAL LOW (ref >60)
GFR calc non Af Amer: 20 mL/min — ABNORMAL LOW (ref >60)
GFR calc non Af Amer: 19 mL/min — ABNORMAL LOW (ref >60)
GFR, EST AFRICAN AMERICAN: 22 mL/min — AB (ref >60)
GLUCOSE: 152 mg/dL — AB (ref 65–99)
Glucose, Bld: 148 mg/dL — ABNORMAL HIGH (ref 65–99)
POTASSIUM: 3.3 mmol/L — AB (ref 3.5–5.1)
Potassium: 3.3 mmol/L — ABNORMAL LOW (ref 3.5–5.1)
SODIUM: 147 mmol/L — AB (ref 135–145)
Sodium: 145 mmol/L (ref 135–145)
TOTAL PROTEIN: 6.4 g/dL — AB (ref 6.5–8.1)
Total Bilirubin: 2.4 mg/dL — ABNORMAL HIGH (ref 0.3–1.2)
Total Protein: 6 g/dL — ABNORMAL LOW (ref 6.5–8.1)

## 2016-01-26 LAB — CBC
HCT: 29.4 % — ABNORMAL LOW (ref 39.0–52.0)
HEMATOCRIT: 28.7 % — AB (ref 39.0–52.0)
HEMOGLOBIN: 9.9 g/dL — AB (ref 13.0–17.0)
Hemoglobin: 9.7 g/dL — ABNORMAL LOW (ref 13.0–17.0)
MCH: 31.7 pg (ref 26.0–34.0)
MCH: 31.7 pg (ref 26.0–34.0)
MCHC: 33.7 g/dL (ref 30.0–36.0)
MCHC: 33.8 g/dL (ref 30.0–36.0)
MCV: 94.2 fL (ref 78.0–100.0)
MCV: 93.8 fL (ref 78.0–100.0)
PLATELETS: 109 10*3/uL — AB (ref 150–400)
Platelets: 141 10*3/uL — ABNORMAL LOW (ref 150–400)
RBC: 3.12 MIL/uL — ABNORMAL LOW (ref 4.22–5.81)
RBC: 3.06 MIL/uL — AB (ref 4.22–5.81)
RDW: 16.6 % — ABNORMAL HIGH (ref 11.5–15.5)
RDW: 16.4 % — ABNORMAL HIGH (ref 11.5–15.5)
WBC: 19.9 10*3/uL — ABNORMAL HIGH (ref 4.0–10.5)
WBC: 19.1 10*3/uL — AB (ref 4.0–10.5)

## 2016-01-26 LAB — CULTURE, BLOOD (ROUTINE X 2)

## 2016-01-26 LAB — CORTISOL: Cortisol, Plasma: >100 ug/dL

## 2016-01-26 LAB — LACTIC ACID, PLASMA: Lactic Acid, Venous: 1 mmol/L (ref 0.5–1.9)

## 2016-01-26 LAB — CK: CK TOTAL: 50 U/L (ref 49–397)

## 2016-01-26 LAB — HEPATITIS PANEL, ACUTE
HCV Ab: <0.1 s/co ratio (ref 0.0–0.9)
HEP B C IGM: NEGATIVE
HEP B S AG: NEGATIVE
Hep A IgM: NEGATIVE

## 2016-01-26 LAB — MAGNESIUM: Magnesium: 2.2 mg/dL (ref 1.7–2.4)

## 2016-01-26 MED ORDER — DEXTROSE 5 % IV SOLN
2.0000 g | INTRAVENOUS | Status: DC
  Administered 2016-01-26 – 2016-02-08 (×14): 2 g via INTRAVENOUS
  Filled 2016-01-26 (×14): qty 2

## 2016-01-26 MED ORDER — POTASSIUM CHLORIDE 10 MEQ/100ML IV SOLN
10.0000 meq | INTRAVENOUS | Status: AC
  Administered 2016-01-26 (×3): 10 meq via INTRAVENOUS
  Filled 2016-01-26 (×3): qty 100

## 2016-01-26 MED ORDER — SODIUM CHLORIDE 0.9 % IV SOLN
510.0000 mg | Freq: Once | INTRAVENOUS | Status: AC
  Administered 2016-01-26: 510 mg via INTRAVENOUS
  Filled 2016-01-26: qty 17

## 2016-01-26 NOTE — Progress Notes (Signed)
   01/26/16 1000  Clinical Encounter Type  Visited With Patient  Visit Type Initial;Psychological support;Spiritual support;Critical Care  Referral From Physician  Consult/Referral To Chaplain  Spiritual Encounters  Spiritual Needs Emotional;Other (Comment) (Pastoral Conversation/Support)  Stress Factors  Patient Stress Factors Health changes;Major life changes  Family Stress Factors None identified   I visited with the patient per Spiritual Care consult. I was notified by the night Chaplain that the patient is a TEFL teacherJehovah's Witness and that his faith means a great deal to him.  When I visited with the patient, two of his congregation members were present at the bedside.  The patient appreciated the Spiritual Care that he has received and feels that it helps him to feel supported while in the hospital. The patient feels heard and that his faith is respected and affirmed.  The patient didn't state any spiritual needs at this time, but would like continued support and visits from Spiritual Care.    Please, contact Spiritual Care for further assistance.   Chaplain Clint BolderBrittany Jannette Cotham M.Div.

## 2016-01-26 NOTE — Progress Notes (Signed)
[facility] PROGRESS NOTE Subjective Without pain. NG small amount of CG material  Objective: Vital signs in last 24 hours: Temp:  [97.2 F (36.2 C)-97.9 F (36.6 C)] 97.4 F (36.3 C) (10/23 0423) Pulse Rate:  [27-107] 88 (10/23 0645) Resp:  [0-24] 12 (10/23 0645) BP: (70-124)/(19-74) 98/47 (10/23 0645) SpO2:  [82 %-100 %] 95 % (10/23 0645) Weight:  [129.5 kg (285 lb 7.9 oz)] 129.5 kg (285 lb 7.9 oz) (10/23 0423) Last BM Date: 01/17/2016  Intake/Output from previous day: 10/22 0701 - 10/23 0700 In: 3094.3 [I.V.:1589.3; NG/GT:105; IV Piggyback:1400] Out: 1955 [Urine:1355; Emesis/NG output:600] Intake/Output this shift: No intake/output data recorded.  PE: General--alert appropriate  Abdomen--obese, good BSs nontender  Lab Results:  Recent Labs  01/19/2016 1100 01/24/16 0946 01/25/16 0814 01/25/16 2000 01/26/16 0300  WBC 13.3* 15.5* 17.2* 19.9* 19.9*  HGB 11.9* 11.0* 9.8* 10.2* 9.9*  HCT 34.8* 32.2* 28.6* 29.8* 29.4*  PLT 205 150 120* 135* 141*   BMET  Recent Labs  01/14/2016 1100 01/24/16 0946 01/25/16 0313 01/26/16 0300  NA 143 143 145 145  K 4.3 3.5 3.4* 3.3*  CL 105 110 113* 111  CO2 21* 23 24 25   CREATININE 3.32* 3.30* 3.14* 2.89*   LFT  Recent Labs  01/25/2016 1100 01/25/16 0313 01/26/16 0300  PROT 7.4 3.4* 6.4*  AST 65* 25 21  ALT 29 16* 20  ALKPHOS 94 34* 55  BILITOT 4.7* 1.7* 2.6*   PT/INR  Recent Labs  01/25/16 1043 01/25/16 2000 01/26/16 0300  LABPROT 16.2* 17.2* 19.2*  INR 1.29 1.39 1.60   PANCREAS No results for input(s): LIPASE in the last 72 hours.       Studies/Results: Ct Abdomen Pelvis Wo Contrast  Result Date: 01/24/2016 CLINICAL DATA:  75 year old male inpatient with a history of atrial fibrillation on Coumadin, CHF, admitted after being found down for 3 days in the bathtub, treated for sepsis and acute renal failure, now with acute respiratory failure, ileus and abdominal distention. Left lung opacities  on chest radiograph. EXAM: CT CHEST, ABDOMEN AND PELVIS WITHOUT CONTRAST TECHNIQUE: Multidetector CT imaging of the chest, abdomen and pelvis was performed following the standard protocol without IV contrast. COMPARISON:  01/24/2016 abdominal radiographs and 01/14/2016 chest radiographs. 07/02/2014 CT abdomen/pelvis. FINDINGS: Examination is significantly limited by patient body habitus, streak artifact from the patient's upper extremities, motion artifact and lack of IV contrast. CT CHEST FINDINGS Cardiovascular: Mild cardiomegaly. No significant pericardial fluid/thickening. Mildly atherosclerotic nonaneurysmal thoracic aorta. Dilated main pulmonary artery (3.7 cm diameter). Mediastinum/Nodes: No discrete thyroid nodules. Unremarkable esophagus. No pathologically enlarged axillary, mediastinal or gross hilar lymph nodes, noting limited sensitivity for the detection of hilar adenopathy on this noncontrast study. Lungs/Pleura: No pneumothorax. Small layering right pleural effusion. No left pleural effusion. There is layering debris in the tracheal lumen at the level of the thoracic inlet. There is a mosaic attenuation throughout both lungs. There are patchy bandlike areas of consolidation with associated volume loss in the bilateral lower lobes, favor atelectasis. Thin parenchymal bands in the right middle lobe and lingula are most consistent with areas of postinfectious/ postinflammatory scarring. No lung masses or significant pulmonary nodules in the aerated portions of the lungs. Musculoskeletal: No aggressive appearing focal osseous lesions. Mild-to-moderate thoracic spondylosis. Anasarca. CT ABDOMEN PELVIS FINDINGS Hepatobiliary: There is relative hypertrophy of the left liver lobe and the liver surface is diffusely irregular, consistent with cirrhosis. No gross liver mass. Normal gallbladder with no radiopaque cholelithiasis. No biliary ductal dilatation. Pancreas: Normal,  with no mass or duct dilation.  Spleen: Normal size. No mass. Adrenals/Urinary Tract: No discrete adrenal nodules. No hydronephrosis. No renal stones. Simple 2.3 cm posterior upper right renal cyst. Simple 3.1 cm lateral interpolar left renal cyst. No additional contour deforming renal masses. Bladder is nearly collapsed by indwelling Foley catheter, with the suggestion of diffuse bladder wall thickening. Stomach/Bowel: Grossly normal stomach. Normal caliber small bowel with no small bowel wall thickening. Faintly visualized normal appendix. There is mild dilatation throughout the large bowel with mild stool and scattered fluid levels in the large bowel. No large bowel wall thickening. Vascular/Lymphatic: Mildly atherosclerotic nonaneurysmal abdominal aorta. No gross pathologically enlarged lymph nodes in the abdomen or pelvis. Reproductive: Markedly enlarged prostate, not appreciably changed. Other: No pneumoperitoneum.  Large volume simple ascites. Musculoskeletal: No aggressive appearing focal osseous lesions. Moderate lumbar spondylosis. Anasarca. IMPRESSION: 1. Cirrhosis. No gross liver mass on this limited noncontrast CT study. Large volume ascites. Normal size spleen. 2. Mild diffuse large bowel dilatation with colonic fluid levels, consistent with mild colonic ileus. 3. Anasarca. 4. Cardiomegaly.  Small layering right pleural effusion. 5. Prominently dilated main pulmonary artery, likely indicating pulmonary arterial hypertension. Mosaic attenuation throughout the lungs could be due to mosaic perfusion from pulmonary vascular disease versus air trapping from small airways disease. 6. Patchy bandlike areas of consolidation with associated volume loss in the lower lungs, favor atelectasis. 7. Markedly enlarged prostate. 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 Bladder nearly collapsed by indwelling Foley catheter. No hydronephrosis. 8. Aortic atherosclerosis. Electronically Signed    By: Delbert Phenix M.D.   On: 01/24/2016 18:30   Ct Chest Wo Contrast  Result Date: 01/24/2016 CLINICAL DATA:  75 year old male inpatient with a history of atrial fibrillation on Coumadin, CHF, admitted after being found down for 3 days in the bathtub, treated for sepsis and acute renal failure, now with acute respiratory failure, ileus and abdominal distention. Left lung opacities on chest radiograph. EXAM: CT CHEST, ABDOMEN AND PELVIS WITHOUT CONTRAST TECHNIQUE: Multidetector CT imaging of the chest, abdomen and pelvis was performed following the standard protocol without IV contrast. COMPARISON:  01/24/2016 abdominal radiographs and 29-Jan-2016 chest radiographs. 07/02/2014 CT abdomen/pelvis. FINDINGS: Examination is significantly limited by patient body habitus, streak artifact from the patient's upper extremities, motion artifact and lack of IV contrast. CT CHEST FINDINGS Cardiovascular: Mild cardiomegaly. No significant pericardial fluid/thickening. Mildly atherosclerotic nonaneurysmal thoracic aorta. Dilated main pulmonary artery (3.7 cm diameter). Mediastinum/Nodes: No discrete thyroid nodules. Unremarkable esophagus. No pathologically enlarged axillary, mediastinal or gross hilar lymph nodes, noting limited sensitivity for the detection of hilar adenopathy on this noncontrast study. Lungs/Pleura: No pneumothorax. Small layering right pleural effusion. No left pleural effusion. There is layering debris in the tracheal lumen at the level of the thoracic inlet. There is a mosaic attenuation throughout both lungs. There are patchy bandlike areas of consolidation with associated volume loss in the bilateral lower lobes, favor atelectasis. Thin parenchymal bands in the right middle lobe and lingula are most consistent with areas of postinfectious/ postinflammatory scarring. No lung masses or significant pulmonary nodules in the aerated portions of the lungs. Musculoskeletal: No aggressive appearing focal  osseous lesions. Mild-to-moderate thoracic spondylosis. Anasarca. CT ABDOMEN PELVIS FINDINGS Hepatobiliary: There is relative hypertrophy of the left liver lobe and the liver surface is diffusely irregular, consistent with cirrhosis. No gross liver mass. Normal gallbladder with no radiopaque cholelithiasis. No biliary ductal dilatation. Pancreas: Normal, with no mass or duct dilation. Spleen:  Normal size. No mass. Adrenals/Urinary Tract: No discrete adrenal nodules. No hydronephrosis. No renal stones. Simple 2.3 cm posterior upper right renal cyst. Simple 3.1 cm lateral interpolar left renal cyst. No additional contour deforming renal masses. Bladder is nearly collapsed by indwelling Foley catheter, with the suggestion of diffuse bladder wall thickening. Stomach/Bowel: Grossly normal stomach. Normal caliber small bowel with no small bowel wall thickening. Faintly visualized normal appendix. There is mild dilatation throughout the large bowel with mild stool and scattered fluid levels in the large bowel. No large bowel wall thickening. Vascular/Lymphatic: Mildly atherosclerotic nonaneurysmal abdominal aorta. No gross pathologically enlarged lymph nodes in the abdomen or pelvis. Reproductive: Markedly enlarged prostate, not appreciably changed. Other: No pneumoperitoneum.  Large volume simple ascites. Musculoskeletal: No aggressive appearing focal osseous lesions. Moderate lumbar spondylosis. Anasarca. IMPRESSION: 1. Cirrhosis. No gross liver mass on this limited noncontrast CT study. Large volume ascites. Normal size spleen. 2. Mild diffuse large bowel dilatation with colonic fluid levels, consistent with mild colonic ileus. 3. Anasarca. 4. Cardiomegaly.  Small layering right pleural effusion. 5. Prominently dilated main pulmonary artery, likely indicating pulmonary arterial hypertension. Mosaic attenuation throughout the lungs could be due to mosaic perfusion from pulmonary vascular disease versus air trapping from  small airways disease. 6. Patchy bandlike areas of consolidation with associated volume loss in the lower lungs, favor atelectasis. 7. Markedly enlarged prostate. 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 Bladder nearly collapsed by indwelling Foley catheter. No hydronephrosis. 8. Aortic atherosclerosis. Electronically Signed   By: Delbert PhenixJason A Poff M.D.   On: 01/24/2016 18:30   Dg Chest Port 1 View  Result Date: 01/25/2016 CLINICAL DATA:  PICC line placement.  Nasogastric tube placement. EXAM: PORTABLE CHEST 1 VIEW COMPARISON:  01/25/2016 FINDINGS: Nasogastric tube tip is in the stomach body. Right-sided PICC line tip projects over the SVC. Leftward shift of cardiac and mediastinal structures. Bilateral interstitial accentuation. Enlarged main pulmonary artery. Left costophrenic angle excluded. IMPRESSION: 1. Nasogastric tube tip in the stomach body, PICC line tip in the SVC. 2. Potential mild cardiomegaly, with some leftward shift of the heart possibly due to left-sided atelectasis. 3. Prominent main pulmonary artery, potentially with some left perihilar atelectasis. 4. Interstitial accentuation could be from atypical pneumonia or mild interstitial edema. Electronically Signed   By: Gaylyn RongWalter  Liebkemann M.D.   On: 01/25/2016 18:38   Dg Chest Port 1 View  Result Date: 01/25/2016 CLINICAL DATA:  Respiratory distress,  hypotension EXAM: PORTABLE CHEST 1 VIEW COMPARISON:  10/20/ 17 FINDINGS: Cardiomegaly is noted. There is NG tube coiled within stomach with tip in proximal stomach. Elevation of the right hemidiaphragm again noted. Persistent streaky left base retrocardiac atelectasis or infiltrate. IMPRESSION: There is NG tube coiled within stomach with tip in proximal stomach. Elevation of the right hemidiaphragm again noted. Persistent streaky left base retrocardiac atelectasis or infiltrate. Electronically Signed   By: Natasha MeadLiviu  Pop M.D.   On: 01/25/2016 10:23    Dg Abd Portable 1v  Result Date: 01/25/2016 CLINICAL DATA:  Check nasogastric catheter placement EXAM: PORTABLE ABDOMEN - 1 VIEW COMPARISON:  01/24/2016 FINDINGS: Scattered large and small bowel gas is noted. The degree of gaseous distension of the colon is stable. A nasogastric catheter is now seen within the stomach. No free air is seen. IMPRESSION: New nasogastric catheter within the stomach.  Stable colonic ileus. Electronically Signed   By: Alcide CleverMark  Lukens M.D.   On: 01/25/2016 10:23    Medications: I have reviewed the  patient's current medications.  Assessment/Plan: 1. UGI Bleed. Source unclear, minimal bleeding. On octreotide,PPI. Pt is witness. Discussed with him, is willing to take IV iron, will give ferriheme and clamp NG. ? Clears tomorrow if tolerates 2. ? Cirrhosis. By CT criteria. Increased PT, LFTs unexplained but getting better.   Higinbotham JR,Marsa Matteo L 01/26/2016, 7:55 AM  This note was created using voice recognition software. Minor errors may Have occurred unintentionally.  Pager: [phone] If no answer or after hours call [phone]

## 2016-01-26 NOTE — Progress Notes (Signed)
Pharmacy Antibiotic Note  Vincent Gibson is a 75 y.o. male admitted on 01/17/2016 with sepsis and cellulitis.  He reports that while taking a bath on 10/17 he became weak and was unable to get out of the bathtub for 3 days; he was found by friends on 10/20 and transported to ED via EMS.  PMH includes CHF, lower extremity swelling, and chronic leg wounds.  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    Plan:  Ceftriaxone 2g IV q24h  Dosage remains stable and need for further dosage adjustment appears unlikely at present.  Pharmacy will sign off at this time.  Please reconsult if a change in clinical status warrants re-evaluation of dosage.   Height: 5\' 11"  (180.3 cm) Weight: 285 lb 7.9 oz (129.5 kg) IBW/kg (Calculated) : 75.3  Temp (24hrs), Avg:97.6 F (36.4 C), Min:97.2 F (36.2 C), Max:97.9 F (36.6 C)   Recent Labs Lab 01/08/2016 1100  01/26/2016 1808 01/31/2016 2114 01/24/16 0946 01/24/16 1312 01/25/16 0313 01/25/16 0814 01/25/16 2000 01/26/16 0300  WBC 13.3*  --   --   --  15.5*  --   --  17.2* 19.9* 19.9*  CREATININE 3.32*  --   --   --  3.30*  --  3.14*  --   --  2.89*  LATICACIDVEN  --   < > 3.1* 2.6* 2.2* 2.0*  --   --   --  1.0  < > = values in this interval not displayed.  Estimated Creatinine Clearance: 30.3 mL/min (by C-G formula based on SCr of 2.89 mg/dL (H)).    No Known Allergies  Antimicrobials this admission: 10/20 Vancomycin >> 10/23 10/20 Zosyn >> 10/23 10/23 Ceftriaxone >>  Dose adjustments this admission:  Microbiology results: 10/20 BCx: 1/2 Morganella morganii (Sens: cefepime, ceftaz, CTX, cipro, gent, imi, P/T, T/S) 10/20 UCx: NGF 10/20 MRSA PCR: neg  Thank you for allowing pharmacy to be a part of this patient's care.  Vincent Gibson PharmD, BCPS Pager [phone] 01/26/2016 8:49 AM

## 2016-01-26 NOTE — Progress Notes (Signed)
PCCM CONSULT NOTE  Admission date: 01/25/2016 Consult date: 01/25/2016 Referring provider: Dr. Jarvis Newcomer, [facility]  CC: Low blood pressure  Subjective:  Pt reports feeling much better.  Remains on 2 mcg levophed.  Hgb stable  Vital signs: BP (!) 98/47   Pulse 88   Temp 97.4 F (36.3 C) (Axillary)   Resp 12   Ht 5\' 11"  (1.803 m)   Wt 285 lb 7.9 oz (129.5 kg)   SpO2 95%   BMI 39.82 kg/m   Intake/output: I/O last 3 completed shifts: In: 3287 [I.V.:1782; NG/GT:105; IV Piggyback:1400] Out: 3355 [Urine:1955; Emesis/NG output:1400]  General: alert Neuro: normal strength, CN intact, moves all extremities HEENT: pupils reactive, NG tube in place, no stridor Cardiac: regular, no murmur Chest: no wheeze, decreased breath sounds Abd: soft, non tender, decreased BS Ext: 2+ non pitting edema, LE cellulitis Skin: chronic stasis ulcers of lower legs b/l   CMP Latest Ref Rng & Units 01/26/2016 01/25/2016 01/24/2016  Glucose 65 - 99 mg/dL 161(W) 960(A) 540(J)  BUN 6 - 20 mg/dL 81(X) 91(Y) 78(G)  Creatinine 0.61 - 1.24 mg/dL 9.56(O) 1.30(Q) 6.57(Q)  Sodium 135 - 145 mmol/L 145 145 143  Potassium 3.5 - 5.1 mmol/L 3.3(L) 3.4(L) 3.5  Chloride 101 - 111 mmol/L 111 113(H) 110  CO2 22 - 32 mmol/L 25 24 23   Calcium 8.9 - 10.3 mg/dL 4.6(N) 6.9(L) 7.9(L)  Total Protein 6.5 - 8.1 g/dL 6.4(L) 3.4(L) -  Total Bilirubin 0.3 - 1.2 mg/dL 2.6(H) 1.7(H) -  Alkaline Phos 38 - 126 U/L 55 34(L) -  AST 15 - 41 U/L 21 25 -  ALT 17 - 63 U/L 20 16(L) -    CBC Latest Ref Rng & Units 01/26/2016 01/25/2016 01/25/2016  WBC 4.0 - 10.5 K/uL 19.9(H) 19.9(H) 17.2(H)  Hemoglobin 13.0 - 17.0 g/dL 6.2(X) 10.2(L) 9.8(L)  Hematocrit 39.0 - 52.0 % 29.4(L) 29.8(L) 28.6(L)  Platelets 150 - 400 K/uL 141(L) 135(L) 120(L)    Lab Results  Component Value Date   INR 1.60 01/26/2016   INR 1.39 01/25/2016   INR 1.29 01/25/2016    CBG (last 3)   Recent Labs  01/25/16 2326 01/26/16 0418 01/26/16 0804  GLUCAP 110*  115* 130*     Ct Abdomen Pelvis Wo Contrast  Result Date: 01/24/2016 CLINICAL DATA:  74 year old male inpatient with a history of atrial fibrillation on Coumadin, CHF, admitted after being found down for 3 days in the bathtub, treated for sepsis and acute renal failure, now with acute respiratory failure, ileus and abdominal distention. Left lung opacities on chest radiograph. EXAM: CT CHEST, ABDOMEN AND PELVIS WITHOUT CONTRAST TECHNIQUE: Multidetector CT imaging of the chest, abdomen and pelvis was performed following the standard protocol without IV contrast. COMPARISON:  01/24/2016 abdominal radiographs and 02/01/2016 chest radiographs. 07/02/2014 CT abdomen/pelvis. FINDINGS: Examination is significantly limited by patient body habitus, streak artifact from the patient's upper extremities, motion artifact and lack of IV contrast. CT CHEST FINDINGS Cardiovascular: Mild cardiomegaly. No significant pericardial fluid/thickening. Mildly atherosclerotic nonaneurysmal thoracic aorta. Dilated main pulmonary artery (3.7 cm diameter). Mediastinum/Nodes: No discrete thyroid nodules. Unremarkable esophagus. No pathologically enlarged axillary, mediastinal or gross hilar lymph nodes, noting limited sensitivity for the detection of hilar adenopathy on this noncontrast study. Lungs/Pleura: No pneumothorax. Small layering right pleural effusion. No left pleural effusion. There is layering debris in the tracheal lumen at the level of the thoracic inlet. There is a mosaic attenuation throughout both lungs. There are patchy bandlike areas of consolidation with associated volume  loss in the bilateral lower lobes, favor atelectasis. Thin parenchymal bands in the right middle lobe and lingula are most consistent with areas of postinfectious/ postinflammatory scarring. No lung masses or significant pulmonary nodules in the aerated portions of the lungs. Musculoskeletal: No aggressive appearing focal osseous lesions.  Mild-to-moderate thoracic spondylosis. Anasarca. CT ABDOMEN PELVIS FINDINGS Hepatobiliary: There is relative hypertrophy of the left liver lobe and the liver surface is diffusely irregular, consistent with cirrhosis. No gross liver mass. Normal gallbladder with no radiopaque cholelithiasis. No biliary ductal dilatation. Pancreas: Normal, with no mass or duct dilation. Spleen: Normal size. No mass. Adrenals/Urinary Tract: No discrete adrenal nodules. No hydronephrosis. No renal stones. Simple 2.3 cm posterior upper right renal cyst. Simple 3.1 cm lateral interpolar left renal cyst. No additional contour deforming renal masses. Bladder is nearly collapsed by indwelling Foley catheter, with the suggestion of diffuse bladder wall thickening. Stomach/Bowel: Grossly normal stomach. Normal caliber small bowel with no small bowel wall thickening. Faintly visualized normal appendix. There is mild dilatation throughout the large bowel with mild stool and scattered fluid levels in the large bowel. No large bowel wall thickening. Vascular/Lymphatic: Mildly atherosclerotic nonaneurysmal abdominal aorta. No gross pathologically enlarged lymph nodes in the abdomen or pelvis. Reproductive: Markedly enlarged prostate, not appreciably changed. Other: No pneumoperitoneum.  Large volume simple ascites. Musculoskeletal: No aggressive appearing focal osseous lesions. Moderate lumbar spondylosis. Anasarca. IMPRESSION: 1. Cirrhosis. No gross liver mass on this limited noncontrast CT study. Large volume ascites. Normal size spleen. 2. Mild diffuse large bowel dilatation with colonic fluid levels, consistent with mild colonic ileus. 3. Anasarca. 4. Cardiomegaly.  Small layering right pleural effusion. 5. Prominently dilated main pulmonary artery, likely indicating pulmonary arterial hypertension. Mosaic attenuation throughout the lungs could be due to mosaic perfusion from pulmonary vascular disease versus air trapping from small airways  disease. 6. Patchy bandlike areas of consolidation with associated volume loss in the lower lungs, favor atelectasis. 7. Markedly enlarged prostate. 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 Bladder nearly collapsed by indwelling Foley catheter. No hydronephrosis. 8. Aortic atherosclerosis. Electronically Signed   By: Delbert Phenix M.D.   On: 01/24/2016 18:30   Ct Chest Wo Contrast  Result Date: 01/24/2016 CLINICAL DATA:  75 year old male inpatient with a history of atrial fibrillation on Coumadin, CHF, admitted after being found down for 3 days in the bathtub, treated for sepsis and acute renal failure, now with acute respiratory failure, ileus and abdominal distention. Left lung opacities on chest radiograph. EXAM: CT CHEST, ABDOMEN AND PELVIS WITHOUT CONTRAST TECHNIQUE: Multidetector CT imaging of the chest, abdomen and pelvis was performed following the standard protocol without IV contrast. COMPARISON:  01/24/2016 abdominal radiographs and 02/01/2016 chest radiographs. 07/02/2014 CT abdomen/pelvis. FINDINGS: Examination is significantly limited by patient body habitus, streak artifact from the patient's upper extremities, motion artifact and lack of IV contrast. CT CHEST FINDINGS Cardiovascular: Mild cardiomegaly. No significant pericardial fluid/thickening. Mildly atherosclerotic nonaneurysmal thoracic aorta. Dilated main pulmonary artery (3.7 cm diameter). Mediastinum/Nodes: No discrete thyroid nodules. Unremarkable esophagus. No pathologically enlarged axillary, mediastinal or gross hilar lymph nodes, noting limited sensitivity for the detection of hilar adenopathy on this noncontrast study. Lungs/Pleura: No pneumothorax. Small layering right pleural effusion. No left pleural effusion. There is layering debris in the tracheal lumen at the level of the thoracic inlet. There is a mosaic attenuation throughout both lungs. There are patchy bandlike  areas of consolidation with associated volume loss in the bilateral lower lobes, favor  atelectasis. Thin parenchymal bands in the right middle lobe and lingula are most consistent with areas of postinfectious/ postinflammatory scarring. No lung masses or significant pulmonary nodules in the aerated portions of the lungs. Musculoskeletal: No aggressive appearing focal osseous lesions. Mild-to-moderate thoracic spondylosis. Anasarca. CT ABDOMEN PELVIS FINDINGS Hepatobiliary: There is relative hypertrophy of the left liver lobe and the liver surface is diffusely irregular, consistent with cirrhosis. No gross liver mass. Normal gallbladder with no radiopaque cholelithiasis. No biliary ductal dilatation. Pancreas: Normal, with no mass or duct dilation. Spleen: Normal size. No mass. Adrenals/Urinary Tract: No discrete adrenal nodules. No hydronephrosis. No renal stones. Simple 2.3 cm posterior upper right renal cyst. Simple 3.1 cm lateral interpolar left renal cyst. No additional contour deforming renal masses. Bladder is nearly collapsed by indwelling Foley catheter, with the suggestion of diffuse bladder wall thickening. Stomach/Bowel: Grossly normal stomach. Normal caliber small bowel with no small bowel wall thickening. Faintly visualized normal appendix. There is mild dilatation throughout the large bowel with mild stool and scattered fluid levels in the large bowel. No large bowel wall thickening. Vascular/Lymphatic: Mildly atherosclerotic nonaneurysmal abdominal aorta. No gross pathologically enlarged lymph nodes in the abdomen or pelvis. Reproductive: Markedly enlarged prostate, not appreciably changed. Other: No pneumoperitoneum.  Large volume simple ascites. Musculoskeletal: No aggressive appearing focal osseous lesions. Moderate lumbar spondylosis. Anasarca. IMPRESSION: 1. Cirrhosis. No gross liver mass on this limited noncontrast CT study. Large volume ascites. Normal size spleen. 2. Mild diffuse large bowel  dilatation with colonic fluid levels, consistent with mild colonic ileus. 3. Anasarca. 4. Cardiomegaly.  Small layering right pleural effusion. 5. Prominently dilated main pulmonary artery, likely indicating pulmonary arterial hypertension. Mosaic attenuation throughout the lungs could be due to mosaic perfusion from pulmonary vascular disease versus air trapping from small airways disease. 6. Patchy bandlike areas of consolidation with associated volume loss in the lower lungs, favor atelectasis. 7. Markedly enlarged prostate. 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 Bladder nearly collapsed by indwelling Foley catheter. No hydronephrosis. 8. Aortic atherosclerosis. Electronically Signed   By: Delbert Phenix M.D.   On: 01/24/2016 18:30   Dg Chest Port 1 View  Result Date: 01/25/2016 CLINICAL DATA:  PICC line placement.  Nasogastric tube placement. EXAM: PORTABLE CHEST 1 VIEW COMPARISON:  01/25/2016 FINDINGS: Nasogastric tube tip is in the stomach body. Right-sided PICC line tip projects over the SVC. Leftward shift of cardiac and mediastinal structures. Bilateral interstitial accentuation. Enlarged main pulmonary artery. Left costophrenic angle excluded. IMPRESSION: 1. Nasogastric tube tip in the stomach body, PICC line tip in the SVC. 2. Potential mild cardiomegaly, with some leftward shift of the heart possibly due to left-sided atelectasis. 3. Prominent main pulmonary artery, potentially with some left perihilar atelectasis. 4. Interstitial accentuation could be from atypical pneumonia or mild interstitial edema. Electronically Signed   By: Gaylyn Rong M.D.   On: 01/25/2016 18:38   Dg Chest Port 1 View  Result Date: 01/25/2016 CLINICAL DATA:  Respiratory distress,  hypotension EXAM: PORTABLE CHEST 1 VIEW COMPARISON:  10/20/ 17 FINDINGS: Cardiomegaly is noted. There is NG tube coiled within stomach with tip in proximal stomach. Elevation of  the right hemidiaphragm again noted. Persistent streaky left base retrocardiac atelectasis or infiltrate. IMPRESSION: There is NG tube coiled within stomach with tip in proximal stomach. Elevation of the right hemidiaphragm again noted. Persistent streaky left base retrocardiac atelectasis or infiltrate. Electronically Signed   By: Natasha Mead M.D.   On: 01/25/2016 10:23  Dg Abd Portable 1v  Result Date: 01/25/2016 CLINICAL DATA:  Check nasogastric catheter placement EXAM: PORTABLE ABDOMEN - 1 VIEW COMPARISON:  01/24/2016 FINDINGS: Scattered large and small bowel gas is noted. The degree of gaseous distension of the colon is stable. A nasogastric catheter is now seen within the stomach. No free air is seen. IMPRESSION: New nasogastric catheter within the stomach.  Stable colonic ileus. Electronically Signed   By: Alcide CleverMark  Lukens M.D.   On: 01/25/2016 10:23    Studies: Echo 12/09/15 >> mild LVH, EF 45 to 50%, mild MR, mod LA dilation, mild/mod RV systolic dysfx, PAS 48 mmHg Doppler Rt arm 10/21 >> no DVT Doppler Rt leg 10/21 >> no DVT CT chest 10/21 >> small pleural effusions, mosaic pattern, ATX, RML scarring CT abd/pelvis 10/21 >> changes of liver cirrhosis, enlarged prostate, colonic ileus  Cultures: Blood 10/20 >> Morganella morganii >> S - zosyn  Urine 10/20 >> negative  Antibiotics: Vancomycin 10/20 >> 10/23 Zosyn 10/20 >>   Events: 10/20 Admit, urology consulted for foley 10/22 Coffee ground emesis, GI consulted, given Kcentra, started levophed  Summary: 75 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  Admitted with AKI, hypotension, hypothermia, hypoglycemia, mild rhabdomyolysis, urine retention, ileus, cellulitis.  Found to have GNR bacteremia.  Developed progressive hypotension and started on pressors.  Found to have cirrhosis of liver changes on CT abdomen.  Developed coffee ground emesis.  He has third spacing of fluid, but  likely has intravascular volume depletion.  Assessment/plan:  Shock 2nd to hypovolemia, GI bleeding and sepsis. - NS @ KVO - wean pressors to keep SBP > 85 - avoid CVL in setting of coagulopathy  Upper GI bleeding. Changes of cirrhosis on CT abdomen. Ileus. - NPO - octreotide, protonix gtt - NG tube  - GI recs > clamp ngt, possible clears tomorrow  Septic shock with lower extremity cellulitis and Morganella bacteremia. - Day 4 of Abx  Lactic acidosis likely from sepsis and liver disease. - Resolved  AKI - baselines creatinine 1.54 from 11/21/15. Hypokalemia  Urine retention with hx of BPH >> had foley placed by urology. - optimize hemodynamics - keep foley for now - replace electrolytes as indicated  Hx of combined CHF, WHO group 2 and 3 pulmonary hypertension. Hx of A fib. - monitor hemodynamics  Acute blood loss anemia. Mild Thrombocytopenia  Elevated INR from coumadin therapy, and liver disease. - Jehovah's Witness >> NO BLOOD PRODUCTS - limit blood draws as able  - pt willing to accept IV iron  DVT prophylaxis - SCDs SUP - Protonix gtt Goals of care - DNR/DNI   Canary BrimBrandi Kahli Mayon, NP-C [facility] Pgr: [phone] or if no answer [phone] 01/26/2016, 8:57 AM

## 2016-01-26 NOTE — Consult Note (Signed)
CONSULTATION NOTE  Reason for Consult: A-fib, chronic systolic CHF/Right heart failure, elevated troponin  Requesting Physician: Dr. Bonner Puna  Cardiologist: ?Croitoru  HPI: This is a 75 y.o. male [place] witness, who claims that he was previously followed by Dr. Sallyanne Kuster in our group, however I cannot find records in the EMR to support this. He says that he thought he was seen within the last couple of years. I did search for an alternate medical record number and did not find that. He has a past medical history significant for A-fib, chronic congestive heart failure with EF 45-50% (12/2015), hypertension, dyslipidemia and mild right heart failure with moderate pulmonary hypertension, presents with fall in his bathtub at home for which he remained in for 3 days until being found by a member of his church. Found to have acute renal failure and mild rhabomyolysis. Went into septic shock and had acute GIB as well. Now off warfarin. Troponin mildly elevated at 0.25, 0.36. BNP was 2155 on admission. He remains in a multifactorial shock state requiring vasopressors. Her no signs of active GI bleeding at this point. He is on antibiotics for Morganella bacteremia. Cardiology is asked to evaluate given systolic heart failure, elevated troponin, chronic atrial fibrillation and long-term anticoagulation options.  PMHx:  Past Medical History:  Diagnosis Date  . A-fib ([place])   . CHF (congestive heart failure) ([place])   . Enlarged heart   . Hyperlipemia   . Hypertension   . Refusal of blood transfusions as patient is Jehovah's Witness    History reviewed. No pertinent surgical history.  FAMHx: Family History  Problem Relation Age of Onset  . Heart failure Mother     SOCHx:  reports that he has never smoked. He has never used smokeless tobacco. He reports that he does not drink alcohol or use drugs.  ALLERGIES: No Known Allergies  ROS: Review of systems not obtained due to patient  factors.  HOME MEDICATIONS: No current facility-administered medications on file prior to encounter.    Current Outpatient Prescriptions on File Prior to Encounter  Medication Sig Dispense Refill  . atorvastatin (LIPITOR) 40 MG tablet Take 40 mg by mouth daily as needed (high cholesterol).    . furosemide (LASIX) 40 MG tablet Take 40 mg by mouth daily as needed for fluid or edema.    . irbesartan (AVAPRO) 300 MG tablet Take 300 mg by mouth daily.     . metoprolol succinate (TOPROL-XL) 50 MG 24 hr tablet Take 50 mg by mouth daily.     Marland Kitchen warfarin (COUMADIN) 5 MG tablet Take 2.5 mg by mouth daily.       HOSPITAL MEDICATIONS: Prior to Admission:  Prescriptions Prior to Admission  Medication Sig Dispense Refill Last Dose  . atorvastatin (LIPITOR) 40 MG tablet Take 40 mg by mouth daily as needed (high cholesterol).   01/20/2016 at Unknown time  . furosemide (LASIX) 40 MG tablet Take 40 mg by mouth daily as needed for fluid or edema.   01/20/2016 at Unknown time  . irbesartan (AVAPRO) 300 MG tablet Take 300 mg by mouth daily.    01/20/2016 at Unknown time  . metoprolol succinate (TOPROL-XL) 50 MG 24 hr tablet Take 50 mg by mouth daily.    01/20/2016 at 9am  . warfarin (COUMADIN) 5 MG tablet Take 2.5 mg by mouth daily.    01/20/2016 at 9am  . [DISCONTINUED] cephALEXin (KEFLEX) 500 MG capsule Take 1 capsule (500 mg total) by mouth 2 (two) times daily. (  Patient not taking: Reported on 01/08/2016) 14 capsule 0 Not Taking at Unknown time    VITALS: Blood pressure (!) 86/49, pulse 82, temperature 97.5 F (36.4 C), temperature source Oral, resp. rate 12, height 5' 11" (1.803 m), weight 285 lb 7.9 oz (129.5 kg), SpO2 90 %.  PHYSICAL EXAM: General appearance: alert, mild distress, morbidly obese and slowed mentation Neck: no carotid bruit and no JVD Lungs: diminished breath sounds bilaterally Heart: irregularly irregular rhythm Abdomen: soft, non-tender; bowel sounds normal; no masses,  no  organomegaly and Morbidly obese Extremities: edema 1+ bilateral pitting edema Pulses: Faint distal pulses Skin: Warm, dry, intact Neurologic: Mental status: Awake, responds to questions but appears somewhat somnolent Psych: Pleasant  LABS: Results for orders placed or performed during the hospital encounter of 01/20/2016 (from the past 48 hour(s))  Glucose, capillary     Status: Abnormal   Collection Time: 01/24/16  5:01 PM  Result Value Ref Range   Glucose-Capillary 100 (H) 65 - 99 mg/dL   Comment 1 Notify RN    Comment 2 Document in Chart   Glucose, capillary     Status: Abnormal   Collection Time: 01/24/16 10:52 PM  Result Value Ref Range   Glucose-Capillary 114 (H) 65 - 99 mg/dL  CK     Status: None   Collection Time: 01/25/16  3:13 AM  Result Value Ref Range   Total CK 225 49 - 397 U/L  Comprehensive metabolic panel     Status: Abnormal   Collection Time: 01/25/16  3:13 AM  Result Value Ref Range   Sodium 145 135 - 145 mmol/L   Potassium 3.4 (L) 3.5 - 5.1 mmol/L   Chloride 113 (H) 101 - 111 mmol/L   CO2 24 22 - 32 mmol/L   Glucose, Bld 112 (H) 65 - 99 mg/dL   BUN 93 (H) 6 - 20 mg/dL   Creatinine, Ser 3.14 (H) 0.61 - 1.24 mg/dL   Calcium 6.9 (L) 8.9 - 10.3 mg/dL   Total Protein 3.4 (L) 6.5 - 8.1 g/dL   Albumin 1.4 (L) 3.5 - 5.0 g/dL   AST 25 15 - 41 U/L   ALT 16 (L) 17 - 63 U/L   Alkaline Phosphatase 34 (L) 38 - 126 U/L   Total Bilirubin 1.7 (H) 0.3 - 1.2 mg/dL   GFR calc non Af Amer 18 (L) >60 mL/min   GFR calc Af Amer 21 (L) >60 mL/min    Comment: (NOTE) The eGFR has been calculated using the CKD EPI equation. This calculation has not been validated in all clinical situations. eGFR's persistently <60 mL/min signify possible Chronic Kidney Disease.    Anion gap 8 5 - 15  Hepatitis panel, acute     Status: None   Collection Time: 01/25/16  3:13 AM  Result Value Ref Range   Hepatitis B Surface Ag Negative Negative   HCV Ab <0.1 0.0 - 0.9 s/co ratio    Comment:  (NOTE)                                  Negative:     < 0.8                             Indeterminate: 0.8 - 0.9  Positive:     > 0.9 The CDC recommends that a positive HCV antibody result be followed up with a HCV Nucleic Acid Amplification test (427062). Performed At: [facility] [place], [place] [id number] Lindon Romp MD VO:[id number]    Hep A IgM Negative Negative   Hep B C IgM Negative Negative  Glucose, capillary     Status: Abnormal   Collection Time: 01/25/16  7:27 AM  Result Value Ref Range   Glucose-Capillary 106 (H) 65 - 99 mg/dL   Comment 1 Notify RN    Comment 2 Document in Chart   CBC     Status: Abnormal   Collection Time: 01/25/16  8:14 AM  Result Value Ref Range   WBC 17.2 (H) 4.0 - 10.5 K/uL   RBC 3.10 (L) 4.22 - 5.81 MIL/uL   Hemoglobin 9.8 (L) 13.0 - 17.0 g/dL   HCT 28.6 (L) 39.0 - 52.0 %   MCV 92.3 78.0 - 100.0 fL   MCH 31.6 26.0 - 34.0 pg   MCHC 34.3 30.0 - 36.0 g/dL   RDW 16.0 (H) 11.5 - 15.5 %   Platelets 120 (L) 150 - 400 K/uL  Protime-INR     Status: Abnormal   Collection Time: 01/25/16  8:14 AM  Result Value Ref Range   Prothrombin Time 44.2 (H) 11.4 - 15.2 seconds   INR 4.53 (HH)     Comment: CONSISTENT WITH PREVIOUS RESULT CRITICAL RESULT CALLED TO, READ BACK BY AND VERIFIED WITH: S MAYES AT 0845 ON 10.22.2017 BY NBROOKS   Protime-INR     Status: Abnormal   Collection Time: 01/25/16 10:43 AM  Result Value Ref Range   Prothrombin Time 16.2 (H) 11.4 - 15.2 seconds   INR 1.29   Glucose, capillary     Status: None   Collection Time: 01/25/16 11:59 AM  Result Value Ref Range   Glucose-Capillary 96 65 - 99 mg/dL   Comment 1 Notify RN    Comment 2 Document in Chart   Glucose, capillary     Status: Abnormal   Collection Time: 01/25/16  3:18 PM  Result Value Ref Range   Glucose-Capillary 114 (H) 65 - 99 mg/dL   Comment 1 Notify RN    Comment 2 Document in Chart   Glucose,  capillary     Status: Abnormal   Collection Time: 01/25/16  7:50 PM  Result Value Ref Range   Glucose-Capillary 116 (H) 65 - 99 mg/dL  CBC     Status: Abnormal   Collection Time: 01/25/16  8:00 PM  Result Value Ref Range   WBC 19.9 (H) 4.0 - 10.5 K/uL   RBC 3.17 (L) 4.22 - 5.81 MIL/uL   Hemoglobin 10.2 (L) 13.0 - 17.0 g/dL   HCT 29.8 (L) 39.0 - 52.0 %   MCV 94.0 78.0 - 100.0 fL   MCH 32.2 26.0 - 34.0 pg   MCHC 34.2 30.0 - 36.0 g/dL   RDW 16.3 (H) 11.5 - 15.5 %   Platelets 135 (L) 150 - 400 K/uL  Protime-INR     Status: Abnormal   Collection Time: 01/25/16  8:00 PM  Result Value Ref Range   Prothrombin Time 17.2 (H) 11.4 - 15.2 seconds   INR 1.39   Cortisol     Status: None   Collection Time: 01/25/16  8:00 PM  Result Value Ref Range   Cortisol, Plasma >100.0 ug/dL    Comment: RESULTS CONFIRMED BY MANUAL DILUTION (NOTE) AM  6.7 - 22.6 ug/dL PM   <10.0       ug/dL Performed at [facility]   Glucose, capillary     Status: Abnormal   Collection Time: 01/25/16 11:26 PM  Result Value Ref Range   Glucose-Capillary 110 (H) 65 - 99 mg/dL   Comment 1 Notify RN    Comment 2 Document in Chart   CBC     Status: Abnormal   Collection Time: 01/26/16  3:00 AM  Result Value Ref Range   WBC 19.9 (H) 4.0 - 10.5 K/uL   RBC 3.12 (L) 4.22 - 5.81 MIL/uL   Hemoglobin 9.9 (L) 13.0 - 17.0 g/dL   HCT 29.4 (L) 39.0 - 52.0 %   MCV 94.2 78.0 - 100.0 fL   MCH 31.7 26.0 - 34.0 pg   MCHC 33.7 30.0 - 36.0 g/dL   RDW 16.6 (H) 11.5 - 15.5 %   Platelets 141 (L) 150 - 400 K/uL  Protime-INR     Status: Abnormal   Collection Time: 01/26/16  3:00 AM  Result Value Ref Range   Prothrombin Time 19.2 (H) 11.4 - 15.2 seconds   INR 1.60   Comprehensive metabolic panel     Status: Abnormal   Collection Time: 01/26/16  3:00 AM  Result Value Ref Range   Sodium 145 135 - 145 mmol/L   Potassium 3.3 (L) 3.5 - 5.1 mmol/L   Chloride 111 101 - 111 mmol/L   CO2 25 22 - 32 mmol/L   Glucose, Bld 152 (H)  65 - 99 mg/dL   BUN 94 (H) 6 - 20 mg/dL   Creatinine, Ser 2.89 (H) 0.61 - 1.24 mg/dL   Calcium 8.4 (L) 8.9 - 10.3 mg/dL   Total Protein 6.4 (L) 6.5 - 8.1 g/dL   Albumin 2.6 (L) 3.5 - 5.0 g/dL   AST 21 15 - 41 U/L   ALT 20 17 - 63 U/L   Alkaline Phosphatase 55 38 - 126 U/L   Total Bilirubin 2.6 (H) 0.3 - 1.2 mg/dL   GFR calc non Af Amer 20 (L) >60 mL/min   GFR calc Af Amer 23 (L) >60 mL/min    Comment: (NOTE) The eGFR has been calculated using the CKD EPI equation. This calculation has not been validated in all clinical situations. eGFR's persistently <60 mL/min signify possible Chronic Kidney Disease.    Anion gap 9 5 - 15  Lactic acid, plasma     Status: None   Collection Time: 01/26/16  3:00 AM  Result Value Ref Range   Lactic Acid, Venous 1.0 0.5 - 1.9 mmol/L  Glucose, capillary     Status: Abnormal   Collection Time: 01/26/16  4:18 AM  Result Value Ref Range   Glucose-Capillary 115 (H) 65 - 99 mg/dL   Comment 1 Notify RN    Comment 2 Document in Chart   Glucose, capillary     Status: Abnormal   Collection Time: 01/26/16  8:04 AM  Result Value Ref Range   Glucose-Capillary 130 (H) 65 - 99 mg/dL  Protime-INR     Status: Abnormal   Collection Time: 01/26/16 10:55 AM  Result Value Ref Range   Prothrombin Time 20.8 (H) 11.4 - 15.2 seconds   INR 1.76   Glucose, capillary     Status: Abnormal   Collection Time: 01/26/16 11:39 AM  Result Value Ref Range   Glucose-Capillary 124 (H) 65 - 99 mg/dL    IMAGING: Ct Abdomen Pelvis Wo Contrast  Result  Date: 01/24/2016 CLINICAL DATA:  75 year old male inpatient with a history of atrial fibrillation on Coumadin, CHF, admitted after being found down for 3 days in the bathtub, treated for sepsis and acute renal failure, now with acute respiratory failure, ileus and abdominal distention. Left lung opacities on chest radiograph. EXAM: CT CHEST, ABDOMEN AND PELVIS WITHOUT CONTRAST TECHNIQUE: Multidetector CT imaging of the chest, abdomen  and pelvis was performed following the standard protocol without IV contrast. COMPARISON:  01/24/2016 abdominal radiographs and 01/19/2016 chest radiographs. 07/02/2014 CT abdomen/pelvis. FINDINGS: Examination is significantly limited by patient body habitus, streak artifact from the patient's upper extremities, motion artifact and lack of IV contrast. CT CHEST FINDINGS Cardiovascular: Mild cardiomegaly. No significant pericardial fluid/thickening. Mildly atherosclerotic nonaneurysmal thoracic aorta. Dilated main pulmonary artery (3.7 cm diameter). Mediastinum/Nodes: No discrete thyroid nodules. Unremarkable esophagus. No pathologically enlarged axillary, mediastinal or gross hilar lymph nodes, noting limited sensitivity for the detection of hilar adenopathy on this noncontrast study. Lungs/Pleura: No pneumothorax. Small layering right pleural effusion. No left pleural effusion. There is layering debris in the tracheal lumen at the level of the thoracic inlet. There is a mosaic attenuation throughout both lungs. There are patchy bandlike areas of consolidation with associated volume loss in the bilateral lower lobes, favor atelectasis. Thin parenchymal bands in the right middle lobe and lingula are most consistent with areas of postinfectious/ postinflammatory scarring. No lung masses or significant pulmonary nodules in the aerated portions of the lungs. Musculoskeletal: No aggressive appearing focal osseous lesions. Mild-to-moderate thoracic spondylosis. Anasarca. CT ABDOMEN PELVIS FINDINGS Hepatobiliary: There is relative hypertrophy of the left liver lobe and the liver surface is diffusely irregular, consistent with cirrhosis. No gross liver mass. Normal gallbladder with no radiopaque cholelithiasis. No biliary ductal dilatation. Pancreas: Normal, with no mass or duct dilation. Spleen: Normal size. No mass. Adrenals/Urinary Tract: No discrete adrenal nodules. No hydronephrosis. No renal stones. Simple 2.3 cm  posterior upper right renal cyst. Simple 3.1 cm lateral interpolar left renal cyst. No additional contour deforming renal masses. Bladder is nearly collapsed by indwelling Foley catheter, with the suggestion of diffuse bladder wall thickening. Stomach/Bowel: Grossly normal stomach. Normal caliber small bowel with no small bowel wall thickening. Faintly visualized normal appendix. There is mild dilatation throughout the large bowel with mild stool and scattered fluid levels in the large bowel. No large bowel wall thickening. Vascular/Lymphatic: Mildly atherosclerotic nonaneurysmal abdominal aorta. No gross pathologically enlarged lymph nodes in the abdomen or pelvis. Reproductive: Markedly enlarged prostate, not appreciably changed. Other: No pneumoperitoneum.  Large volume simple ascites. Musculoskeletal: No aggressive appearing focal osseous lesions. Moderate lumbar spondylosis. Anasarca. IMPRESSION: 1. Cirrhosis. No gross liver mass on this limited noncontrast CT study. Large volume ascites. Normal size spleen. 2. Mild diffuse large bowel dilatation with colonic fluid levels, consistent with mild colonic ileus. 3. Anasarca. 4. Cardiomegaly.  Small layering right pleural effusion. 5. Prominently dilated main pulmonary artery, likely indicating pulmonary arterial hypertension. Mosaic attenuation throughout the lungs could be due to mosaic perfusion from pulmonary vascular disease versus air trapping from small airways disease. 6. Patchy bandlike areas of consolidation with associated volume loss in the lower lungs, favor atelectasis. 7. Markedly enlarged prostate. 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 Bladder nearly collapsed by indwelling Foley catheter. No hydronephrosis. 8. Aortic atherosclerosis. Electronically Signed   By: Ilona Sorrel M.D.   On: 01/24/2016 18:30   Ct Chest Wo Contrast  Result Date: 01/24/2016 CLINICAL DATA:  75 year old male  inpatient with a history of atrial fibrillation on Coumadin, CHF, admitted after being found down for 3 days in the bathtub, treated for sepsis and acute renal failure, now with acute respiratory failure, ileus and abdominal distention. Left lung opacities on chest radiograph. EXAM: CT CHEST, ABDOMEN AND PELVIS WITHOUT CONTRAST TECHNIQUE: Multidetector CT imaging of the chest, abdomen and pelvis was performed following the standard protocol without IV contrast. COMPARISON:  01/24/2016 abdominal radiographs and 01/22/2016 chest radiographs. 07/02/2014 CT abdomen/pelvis. FINDINGS: Examination is significantly limited by patient body habitus, streak artifact from the patient's upper extremities, motion artifact and lack of IV contrast. CT CHEST FINDINGS Cardiovascular: Mild cardiomegaly. No significant pericardial fluid/thickening. Mildly atherosclerotic nonaneurysmal thoracic aorta. Dilated main pulmonary artery (3.7 cm diameter). Mediastinum/Nodes: No discrete thyroid nodules. Unremarkable esophagus. No pathologically enlarged axillary, mediastinal or gross hilar lymph nodes, noting limited sensitivity for the detection of hilar adenopathy on this noncontrast study. Lungs/Pleura: No pneumothorax. Small layering right pleural effusion. No left pleural effusion. There is layering debris in the tracheal lumen at the level of the thoracic inlet. There is a mosaic attenuation throughout both lungs. There are patchy bandlike areas of consolidation with associated volume loss in the bilateral lower lobes, favor atelectasis. Thin parenchymal bands in the right middle lobe and lingula are most consistent with areas of postinfectious/ postinflammatory scarring. No lung masses or significant pulmonary nodules in the aerated portions of the lungs. Musculoskeletal: No aggressive appearing focal osseous lesions. Mild-to-moderate thoracic spondylosis. Anasarca. CT ABDOMEN PELVIS FINDINGS Hepatobiliary: There is relative hypertrophy  of the left liver lobe and the liver surface is diffusely irregular, consistent with cirrhosis. No gross liver mass. Normal gallbladder with no radiopaque cholelithiasis. No biliary ductal dilatation. Pancreas: Normal, with no mass or duct dilation. Spleen: Normal size. No mass. Adrenals/Urinary Tract: No discrete adrenal nodules. No hydronephrosis. No renal stones. Simple 2.3 cm posterior upper right renal cyst. Simple 3.1 cm lateral interpolar left renal cyst. No additional contour deforming renal masses. Bladder is nearly collapsed by indwelling Foley catheter, with the suggestion of diffuse bladder wall thickening. Stomach/Bowel: Grossly normal stomach. Normal caliber small bowel with no small bowel wall thickening. Faintly visualized normal appendix. There is mild dilatation throughout the large bowel with mild stool and scattered fluid levels in the large bowel. No large bowel wall thickening. Vascular/Lymphatic: Mildly atherosclerotic nonaneurysmal abdominal aorta. No gross pathologically enlarged lymph nodes in the abdomen or pelvis. Reproductive: Markedly enlarged prostate, not appreciably changed. Other: No pneumoperitoneum.  Large volume simple ascites. Musculoskeletal: No aggressive appearing focal osseous lesions. Moderate lumbar spondylosis. Anasarca. IMPRESSION: 1. Cirrhosis. No gross liver mass on this limited noncontrast CT study. Large volume ascites. Normal size spleen. 2. Mild diffuse large bowel dilatation with colonic fluid levels, consistent with mild colonic ileus. 3. Anasarca. 4. Cardiomegaly.  Small layering right pleural effusion. 5. Prominently dilated main pulmonary artery, likely indicating pulmonary arterial hypertension. Mosaic attenuation throughout the lungs could be due to mosaic perfusion from pulmonary vascular disease versus air trapping from small airways disease. 6. Patchy bandlike areas of consolidation with associated volume loss in the lower lungs, favor atelectasis. 7.  Markedly enlarged prostate. 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 Bladder nearly collapsed by indwelling Foley catheter. No hydronephrosis. 8. Aortic atherosclerosis. Electronically Signed   By: Ilona Sorrel M.D.   On: 01/24/2016 18:30   Dg Chest Port 1 View  Result Date: 01/25/2016 CLINICAL DATA:  PICC line placement.  Nasogastric tube placement. EXAM: PORTABLE  CHEST 1 VIEW COMPARISON:  01/25/2016 FINDINGS: Nasogastric tube tip is in the stomach body. Right-sided PICC line tip projects over the SVC. Leftward shift of cardiac and mediastinal structures. Bilateral interstitial accentuation. Enlarged main pulmonary artery. Left costophrenic angle excluded. IMPRESSION: 1. Nasogastric tube tip in the stomach body, PICC line tip in the SVC. 2. Potential mild cardiomegaly, with some leftward shift of the heart possibly due to left-sided atelectasis. 3. Prominent main pulmonary artery, potentially with some left perihilar atelectasis. 4. Interstitial accentuation could be from atypical pneumonia or mild interstitial edema. Electronically Signed   By: Van Clines M.D.   On: 01/25/2016 18:38   Dg Chest Port 1 View  Result Date: 01/25/2016 CLINICAL DATA:  Respiratory distress,  hypotension EXAM: PORTABLE CHEST 1 VIEW COMPARISON:  10/20/ 17 FINDINGS: Cardiomegaly is noted. There is NG tube coiled within stomach with tip in proximal stomach. Elevation of the right hemidiaphragm again noted. Persistent streaky left base retrocardiac atelectasis or infiltrate. IMPRESSION: There is NG tube coiled within stomach with tip in proximal stomach. Elevation of the right hemidiaphragm again noted. Persistent streaky left base retrocardiac atelectasis or infiltrate. Electronically Signed   By: Lahoma Crocker M.D.   On: 01/25/2016 10:23   Dg Abd Portable 1v  Result Date: 01/25/2016 CLINICAL DATA:  Check nasogastric catheter placement EXAM: PORTABLE ABDOMEN - 1  VIEW COMPARISON:  01/24/2016 FINDINGS: Scattered large and small bowel gas is noted. The degree of gaseous distension of the colon is stable. A nasogastric catheter is now seen within the stomach. No free air is seen. IMPRESSION: New nasogastric catheter within the stomach.  Stable colonic ileus. Electronically Signed   By: Inez Catalina M.D.   On: 01/25/2016 10:23   EKG: A. fib with right bundle branch block and old inferior infarct pattern  HOSPITAL DIAGNOSES: Principal Problem:   AKI (acute kidney injury) ([place]) Active Problems:   Urinary retention   Prolonged Q-T interval on ECG   Atrial fibrillation ([facility])   Chronic systolic heart failure ([facility])   Sepsis ([place])   Cellulitis and abscess of right lower extremity   Pressure injury of skin   IMPRESSION: 1. Chronic systolic congestive heart failure 2. Long-term persistent atrial fibrillation (was on warfarin) - CHADSVASC score of 4 3. Hypertension 4. Prolonged QTC 5. Multifactorial shock state 6. Recent acute GI bleeding 7. Morganella sepsis 8. Rhabdomyolysis secondary to stasis 9. Elevated troponin-suspect demand ischemia 10. RBBB  RECOMMENDATION: 1. Mr. Nabers presents with shock which was contributed to rhabdomyolysis, acute renal failure, Morganella sepsis, acute GI bleeding and chronic systolic congestive heart failure. He has persisted if not permanent atrial fibrillation. Unfortunately I could not find notes from Dr. Sallyanne Kuster, who he claims he has seen in the past. His most recent echo was in September 2017 however given ongoing hypotension and elevated troponins (which are in the setting of an elevated CK-MB of 528), would recommend a repeat limited echo for LV function and wall motion abnormalities. BNP was noted to be markedly elevated at 2155. He denies any chest pain during this episode. Based on those findings, further ischemic workup could be contemplated. He is currently in rate-controlled atrial fibrillation. It's unlikely  that he would be a good candidate for long-term anticoagulation, particularly given GI bleeding and the fact that he is a Jehovah's Witness and would refuse blood products. He may be a candidate for the Watchman left atrial appendage occluder device. Creatinine will not allow invasive procedures at this point.  Thanks for the consultation.  Cardiology will follow closely with you.  Time Spent Directly with Patient: 45 minutes  Pixie Casino, MD, [facility] Attending Cardiologist [place] 01/26/2016, 1:41 PM

## 2016-01-26 NOTE — Progress Notes (Signed)
OT Cancellation Note  Patient Details Name: Garlon HatchetJohn W Flavell MRN: [id number] DOB: 10-Nov-1940   Cancelled Treatment:    Reason Eval/Treat Not Completed: Medical issues which prohibited therapy  Will recheck on pt next day Lise AuerLori Rudell Marlowe, OT [phone]  Einar CrowEDDING, Gorman Safi D 01/26/2016, 1:07 PM

## 2016-01-26 NOTE — Progress Notes (Signed)
PROGRESS NOTE  Vincent Gibson  OZH:[id number] DOB: 10-10-1940 DOA: 02/02/2016 PCP: Georgianne Fick, MD  Brief Narrative: Vincent Gibson is a 75 y.o. 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 He stated he was too weak to get up and had not taken any po nor urinated or defecated during that time. On arrival he was alert and oriented, dry mucous membranes and had anasarca with leg swelling R>L with evidence of cellulitis on the right leg. Abdomen was tympanitic and distended. Rectal temperature was 96.4F, glucose 26mg /dl, hypotensive and tachycardic saturating 90% on room air, 99% with 2L O2. Labs were significant for creatinine 3.32, BUN 86, bicarbonate 21, CK 528, troponin 0.36, BNP 2,155. Abdomen with chest x-ray showed abnormal increased left hilar and retrocardiac density in the chest, findings suggestive of ascites and colonic ileus. INR on arrival was supratherapeutic at 6, jumped to 10 10/21. Oral vitamin K given during the day. CT imaging showed cirrhosis (no known history of this) and large volume ascites.   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 NG tube placed, IV NS boluses given, protonix gtt started, IV vitamin K was given. BP remained low despite NS, likely third spacing IV fluid. Pt is DNR per statement at admission when he had capacity for medical decision making, and remains DNR. Also a Jehovah's witness, refusing blood transfusions. Levophed started, protonix gtt, octreotide gtt added. After discussion with patient, verbal consent for "blood substitutes" including albumin and Kcentra was obtained, and these were given. INR has normalized and no further signs of bleeding. He continues on levophed at 3.    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 Presumed variceal bleed. No varices seen on CT that was limited without contrast.  - On pressorsL levophed with MAPs 54-77 overnight.  - Kcentra, vit K given: INR down to 1.29 but trending back up. Daily vitamin K.  - hgb stable, recheck labs at 3pm - Volume resuscitation largely third spaced hold further IVF - Continue levophed, titrate as able for MAP 65. - CCM and GI following: changed to ICU status - No hematemesis, trial clamp NGT, consider advancing diet tmrw - Checking iron studies, agrees to IV iron now.  Acute renal failure/anasarca ?hepatorenal syndrome: Cr 3.32, unknown baseline (only prior creatinine was 1.5). Multifactorial: pre-renal due to dehydration from no po intake x 3 days, intrarenal suggested by U Na < 10 and pt on ARB, and postrenal with urinary retention due to BPH.  - Hold ARB and lasix - Coude catheter placed by urology in ED. - 0.40ml/kg/hr UOP.   Severe sepsis due to cellulitis: Lactic acidosis resolved and trending downward (3.1 > 2.2) - Cellulitis improving on RLE - Morbanella noted in 1 of 2 blood cultures from 10/20: continuing zosyn, awaiting sensitivities. - Continue zosyn (10/20 >> ), s/p vanc 10/20 - 10/22 - Monitor blood cultures and urine culture. - No evidence of DVT  on initial venous doppler - WOC consult for this and other wounds that do not appear infected.  Chronic systolic heart failure and right heart failure: EF 45-50%, mild LVH, on recent echo read by Dr. Elease Hashimoto, with moderate pulmonary HTN (PA peak ) and right ventricle mild-mod reduced  function. LE edema and distended abdomen on exam, ?congestive hepatopathy with elevated INR and hypoalbuminemia. BNP 2155.  - Initial troponin elevated in setting of renal failure. ECG with IVCD, TWI's of unknown chronicity on admission ECG. Recheck shows downtrending.  - Hold ARB and lasix with hypotension - Hold further IVF for now - Wt up 4kg since admission, monitor daily. - Consult cardiology   Atrial fibrillation: Rate controlled.  - Hold BB now - Hold coumadin  Acute respiratory failure: Due to sepsis. No definite pulmonary infiltrates on CT - Oxygen prn for saturations to remain > 90%  Colonic ileus: Demonstrated on abd plain films. No volvulus on CT.  - Glycerin suppository. - Avoid fleet enema in renal failure.  Mild rhabdomyolysis: From 3 days laying in bathtub. Improved. - Recheck CK  Prolonged QTc interval: at admission - Telemetry - Monitor electrolytes. K and Mg: replete and recheck  - Minimize provocative medications as able.  Urinary retention: Severely enlarged prostate on CT in 2016.  - Foley placed by urology. Voiding trial eventually prior to discharge.   DVT prophylaxis: Supratherapeutic INR Code Status: DNR confirmed at admission and this AM Family Communication: None available. Disposition Plan: ICU  Consultants:   CCM  GI  Urology  Social Work  Pastoral care  Procedures:   Foley placed by Dr. Perley Jain, urology 10/20  RLE venous doppler 10/20 negative preliminary read for DVT  RUE venous doppler 10/20 negative preliminary read for SVT or DVT  Hematemesis 10/22 >> levophed started  Antimicrobials:  Vancomycin (10/20 >> 10/22)  Zosyn (10/20 >> )   Subjective: Pt alert, oriented, says he feels fine. No complaints. No hematemesis, hematochezia, fevers, chest pain.   Objective: Vitals:   01/26/16 0600 01/26/16 0615 01/26/16 0630 01/26/16 0645  BP: (!) 78/48 (!) 85/56 (!) 92/57 (!) 98/47  Pulse: 97 85 94 88  Resp:  11 (!) 9 14 12   Temp:      TempSrc:      SpO2: 92% 92% 93% 95%  Weight:      Height:        Intake/Output Summary (Last 24 hours) at 01/26/16 0821 Last data filed at 01/26/16 0606  Gross per 24 hour  Intake          3080.32 ml  Output             1955 ml  Net          1125.32 ml   Filed Weights   01/24/16 1800 01/25/16 0448 01/26/16 0423  Weight: 125.2 kg (276 lb 0.3 oz) 129.8 kg (286 lb 2.5 oz) 129.5 kg (285 lb 7.9 oz)    Examination: General exam: 75 y.o. male in no distress HEENT: Dry mucous membranes. NG tube in right nare. Respiratory system: Nonlabored breathing with O2 by nasal cannula hovering 93-95%. Bibasilar crackles. Cardiovascular system: Irregular HR 90s. No murmur, rub, or gallop. + JVD. Gastrointestinal system: Abdomen protuberant, distended with hypoactive bowel sounds. Do not appreciate fluid wave. + reducible umbilical hernia. No tenderness. Central nervous system: Alert and oriented. No focal neurological deficits. Decreased strength diffusely, but still 5/5 x 4 extremities. Sensation intact. No pain with right ankle ROM.   Extremities:  Warm, diffuse anasarca with weeping x4 extremities. LEs with 3+ pitting edema and bullous stasis changes. RLE with indistinct erythema anterolaterally above the ankle receding distally from demarcation. No tracking noted. No drainage or fluctuance palpated. Skin: As above.  Psychiatry: Calm.  Data Reviewed: I have personally reviewed following labs and imaging studies  CBC:  Recent Labs Lab February 17, 2016 1100 01/24/16 0946 01/25/16 0814 01/25/16 2000 01/26/16 0300  WBC 13.3* 15.5* 17.2* 19.9* 19.9*  NEUTROABS 12.3*  --   --   --   --   HGB 11.9* 11.0* 9.8* 10.2* 9.9*  HCT 34.8* 32.2* 28.6* 29.8* 29.4*  MCV 93.0 93.3 92.3 94.0 94.2  PLT 205 150 120* 135* 141*   Basic Metabolic Panel:  Recent Labs Lab Feb 17, 2016 1100 01/24/16 0946 01/25/16 0313 01/26/16 0300  NA 143 143 145 145  K 4.3 3.5 3.4* 3.3*  CL 105 110 113*  111  CO2 21* 23 24 25   GLUCOSE 90 113* 112* 152*  BUN 86* 87* 93* 94*  CREATININE 3.32* 3.30* 3.14* 2.89*  CALCIUM 8.9 7.9* 6.9* 8.4*  MG 2.4  --   --   --    GFR: Estimated Creatinine Clearance: 30.3 mL/min (by C-G formula based on SCr of 2.89 mg/dL (H)). Liver Function Tests:  Recent Labs Lab 17-Feb-2016 1100 01/25/16 0313 01/26/16 0300  AST 65* 25 21  ALT 29 16* 20  ALKPHOS 94 34* 55  BILITOT 4.7* 1.7* 2.6*  PROT 7.4 3.4* 6.4*  ALBUMIN 3.1* 1.4* 2.6*   No results for input(s): LIPASE, AMYLASE in the last 168 hours. No results for input(s): AMMONIA in the last 168 hours. Coagulation Profile:  Recent Labs Lab 01/24/16 0946 01/25/16 0814 01/25/16 1043 01/25/16 2000 01/26/16 0300  INR 10.23* 4.53* 1.29 1.39 1.60   Cardiac Enzymes:  Recent Labs Lab 17-Feb-2016 1100 01/24/16 1312 01/25/16 0313  CKTOTAL 528*  --  225  TROPONINI 0.36* 0.25*  --    BNP (last 3 results) No results for input(s): PROBNP in the last 8760 hours. HbA1C: No results for input(s): HGBA1C in the last 72 hours. CBG:  Recent Labs Lab 01/25/16 1518 01/25/16 1950 01/25/16 2326 01/26/16 0418 01/26/16 0804  GLUCAP 114* 116* 110* 115* 130*   Lipid Profile: No results for input(s): CHOL, HDL, LDLCALC, TRIG, CHOLHDL, LDLDIRECT in the last 72 hours. Thyroid Function Tests: No results for input(s): TSH, T4TOTAL, FREET4, T3FREE, THYROIDAB in the last 72 hours. Anemia Panel: No results for input(s): VITAMINB12, FOLATE, FERRITIN, TIBC, IRON, RETICCTPCT in the last 72 hours. Urine analysis:    Component Value Date/Time   COLORURINE AMBER (A) Feb 17, 2016 1507   APPEARANCEUR CLOUDY (A) February 17, 2016 1507   LABSPEC 1.018 02-17-16 1507   PHURINE 5.5 02-17-2016 1507   GLUCOSEU NEGATIVE 02/17/16 1507   HGBUR SMALL (A) Feb 17, 2016 1507   BILIRUBINUR SMALL (A) 02/17/16 1507   KETONESUR NEGATIVE 17-Feb-2016 1507   PROTEINUR NEGATIVE 2016/02/17 1507   NITRITE NEGATIVE 02/17/2016 1507   LEUKOCYTESUR  NEGATIVE 02-17-2016 1507   Sepsis Labs: @LABRCNTIP (procalcitonin:4,lacticidven:4)  ) Recent Results (from the past 240 hour(s))  Culture, blood (routine x 2)     Status: None (Preliminary result)   Collection Time: 02/17/2016 12:48 PM  Result Value Ref Range Status   Specimen Description BLOOD LEFT ANTECUBITAL  Final   Special Requests BOTTLES DRAWN AEROBIC AND ANAEROBIC 5 CC EA  Final   Culture   Final    NO GROWTH 2 DAYS Performed at [facility]    Report  Status PENDING  Incomplete  Culture, blood (routine x 2)     Status: Abnormal   Collection Time: 02/02/2016 12:48 PM  Result Value Ref Range Status   Specimen Description BLOOD LEFT ANTECUBITAL  Final   Special Requests BOTTLES DRAWN AEROBIC AND ANAEROBIC 5 CC EA  Final   Culture  Setup Time   Final    GRAM NEGATIVE RODS ANAEROBIC BOTTLE ONLY CRITICAL RESULT CALLED TO, READ BACK BY AND VERIFIED WITH: Haze Boyden, PHARM AT 0813 ON [id number] BY Lucienne Capers Performed at [facility]    Culture [facility] MORGANII (A)  Final   Report Status 01/26/2016 FINAL  Final   Organism ID, Bacteria MORGANELLA MORGANII  Final      Susceptibility   Morganella morganii - MIC*    AMPICILLIN >=32 RESISTANT Resistant     CEFAZOLIN >=64 RESISTANT Resistant     CEFEPIME <=1 SENSITIVE Sensitive     CEFTAZIDIME <=1 SENSITIVE Sensitive     CEFTRIAXONE <=1 SENSITIVE Sensitive     CIPROFLOXACIN <=0.25 SENSITIVE Sensitive     GENTAMICIN <=1 SENSITIVE Sensitive     IMIPENEM 4 SENSITIVE Sensitive     TRIMETH/SULFA <=20 SENSITIVE Sensitive     AMPICILLIN/SULBACTAM >=32 RESISTANT Resistant     PIP/TAZO <=4 SENSITIVE Sensitive     * MORGANELLA MORGANII  Blood Culture ID Panel (Reflexed)     Status: None   Collection Time: 01/10/2016 12:48 PM  Result Value Ref Range Status   Enterococcus species NOT DETECTED NOT DETECTED Final   Listeria monocytogenes NOT DETECTED NOT DETECTED Final   Staphylococcus species NOT DETECTED NOT DETECTED Final    Staphylococcus aureus NOT DETECTED NOT DETECTED Final   Streptococcus species NOT DETECTED NOT DETECTED Final   Streptococcus agalactiae NOT DETECTED NOT DETECTED Final   Streptococcus pneumoniae NOT DETECTED NOT DETECTED Final   Streptococcus pyogenes NOT DETECTED NOT DETECTED Final   Acinetobacter baumannii NOT DETECTED NOT DETECTED Final   Enterobacteriaceae species NOT DETECTED NOT DETECTED Final   Enterobacter cloacae complex NOT DETECTED NOT DETECTED Final   Escherichia coli NOT DETECTED NOT DETECTED Final   Klebsiella oxytoca NOT DETECTED NOT DETECTED Final   Klebsiella pneumoniae NOT DETECTED NOT DETECTED Final   Proteus species NOT DETECTED NOT DETECTED Final   Serratia marcescens NOT DETECTED NOT DETECTED Final   Haemophilus influenzae NOT DETECTED NOT DETECTED Final   Neisseria meningitidis NOT DETECTED NOT DETECTED Final   Pseudomonas aeruginosa NOT DETECTED NOT DETECTED Final   Candida albicans NOT DETECTED NOT DETECTED Final   Candida glabrata NOT DETECTED NOT DETECTED Final   Candida krusei NOT DETECTED NOT DETECTED Final   Candida parapsilosis NOT DETECTED NOT DETECTED Final   Candida tropicalis NOT DETECTED NOT DETECTED Final    Comment: Performed at [facility]  Culture, Urine     Status: None   Collection Time: 01/21/2016  5:16 PM  Result Value Ref Range Status   Specimen Description URINE, RANDOM  Final   Special Requests NONE  Final   Culture NO GROWTH Performed at [facility]   Final   Report Status 01/24/2016 FINAL  Final  MRSA PCR Screening     Status: None   Collection Time: 01/09/2016  5:21 PM  Result Value Ref Range Status   MRSA by PCR NEGATIVE NEGATIVE Final    Comment:        The GeneXpert MRSA Assay (FDA approved for NASAL specimens only), is one component of a comprehensive MRSA colonization surveillance  program. It is not intended to diagnose MRSA infection nor to guide or monitor treatment for MRSA infections.       Radiology Studies: Ct Abdomen Pelvis Wo Contrast  Result Date: 01/24/2016 CLINICAL DATA:  75 year old male inpatient with a history of atrial fibrillation on Coumadin, CHF, admitted after being found down for 3 days in the bathtub, treated for sepsis and acute renal failure, now with acute respiratory failure, ileus and abdominal distention. Left lung opacities on chest radiograph. EXAM: CT CHEST, ABDOMEN AND PELVIS WITHOUT CONTRAST TECHNIQUE: Multidetector CT imaging of the chest, abdomen and pelvis was performed following the standard protocol without IV contrast. COMPARISON:  01/24/2016 abdominal radiographs and 2016-02-13 chest radiographs. 07/02/2014 CT abdomen/pelvis. FINDINGS: Examination is significantly limited by patient body habitus, streak artifact from the patient's upper extremities, motion artifact and lack of IV contrast. CT CHEST FINDINGS Cardiovascular: Mild cardiomegaly. No significant pericardial fluid/thickening. Mildly atherosclerotic nonaneurysmal thoracic aorta. Dilated main pulmonary artery (3.7 cm diameter). Mediastinum/Nodes: No discrete thyroid nodules. Unremarkable esophagus. No pathologically enlarged axillary, mediastinal or gross hilar lymph nodes, noting limited sensitivity for the detection of hilar adenopathy on this noncontrast study. Lungs/Pleura: No pneumothorax. Small layering right pleural effusion. No left pleural effusion. There is layering debris in the tracheal lumen at the level of the thoracic inlet. There is a mosaic attenuation throughout both lungs. There are patchy bandlike areas of consolidation with associated volume loss in the bilateral lower lobes, favor atelectasis. Thin parenchymal bands in the right middle lobe and lingula are most consistent with areas of postinfectious/ postinflammatory scarring. No lung masses or significant pulmonary nodules in the aerated portions of the lungs. Musculoskeletal: No aggressive appearing focal osseous lesions.  Mild-to-moderate thoracic spondylosis. Anasarca. CT ABDOMEN PELVIS FINDINGS Hepatobiliary: There is relative hypertrophy of the left liver lobe and the liver surface is diffusely irregular, consistent with cirrhosis. No gross liver mass. Normal gallbladder with no radiopaque cholelithiasis. No biliary ductal dilatation. Pancreas: Normal, with no mass or duct dilation. Spleen: Normal size. No mass. Adrenals/Urinary Tract: No discrete adrenal nodules. No hydronephrosis. No renal stones. Simple 2.3 cm posterior upper right renal cyst. Simple 3.1 cm lateral interpolar left renal cyst. No additional contour deforming renal masses. Bladder is nearly collapsed by indwelling Foley catheter, with the suggestion of diffuse bladder wall thickening. Stomach/Bowel: Grossly normal stomach. Normal caliber small bowel with no small bowel wall thickening. Faintly visualized normal appendix. There is mild dilatation throughout the large bowel with mild stool and scattered fluid levels in the large bowel. No large bowel wall thickening. Vascular/Lymphatic: Mildly atherosclerotic nonaneurysmal abdominal aorta. No gross pathologically enlarged lymph nodes in the abdomen or pelvis. Reproductive: Markedly enlarged prostate, not appreciably changed. Other: No pneumoperitoneum.  Large volume simple ascites. Musculoskeletal: No aggressive appearing focal osseous lesions. Moderate lumbar spondylosis. Anasarca. IMPRESSION: 1. Cirrhosis. No gross liver mass on this limited noncontrast CT study. Large volume ascites. Normal size spleen. 2. Mild diffuse large bowel dilatation with colonic fluid levels, consistent with mild colonic ileus. 3. Anasarca. 4. Cardiomegaly.  Small layering right pleural effusion. 5. Prominently dilated main pulmonary artery, likely indicating pulmonary arterial hypertension. Mosaic attenuation throughout the lungs could be due to mosaic perfusion from pulmonary vascular disease versus air trapping from small airways  disease. 6. Patchy bandlike areas of consolidation with associated volume loss in the lower lungs, favor atelectasis. 7. Markedly enlarged prostate. 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 Bladder nearly collapsed by indwelling Foley catheter. No hydronephrosis.  8. Aortic atherosclerosis. Electronically Signed   By: Delbert PhenixJason A Poff M.D.   On: 01/24/2016 18:30   Ct Chest Wo Contrast  Result Date: 01/24/2016 CLINICAL DATA:  75 year old male inpatient with a history of atrial fibrillation on Coumadin, CHF, admitted after being found down for 3 days in the bathtub, treated for sepsis and acute renal failure, now with acute respiratory failure, ileus and abdominal distention. Left lung opacities on chest radiograph. EXAM: CT CHEST, ABDOMEN AND PELVIS WITHOUT CONTRAST TECHNIQUE: Multidetector CT imaging of the chest, abdomen and pelvis was performed following the standard protocol without IV contrast. COMPARISON:  01/24/2016 abdominal radiographs and 01/26/2016 chest radiographs. 07/02/2014 CT abdomen/pelvis. FINDINGS: Examination is significantly limited by patient body habitus, streak artifact from the patient's upper extremities, motion artifact and lack of IV contrast. CT CHEST FINDINGS Cardiovascular: Mild cardiomegaly. No significant pericardial fluid/thickening. Mildly atherosclerotic nonaneurysmal thoracic aorta. Dilated main pulmonary artery (3.7 cm diameter). Mediastinum/Nodes: No discrete thyroid nodules. Unremarkable esophagus. No pathologically enlarged axillary, mediastinal or gross hilar lymph nodes, noting limited sensitivity for the detection of hilar adenopathy on this noncontrast study. Lungs/Pleura: No pneumothorax. Small layering right pleural effusion. No left pleural effusion. There is layering debris in the tracheal lumen at the level of the thoracic inlet. There is a mosaic attenuation throughout both lungs. There are patchy bandlike  areas of consolidation with associated volume loss in the bilateral lower lobes, favor atelectasis. Thin parenchymal bands in the right middle lobe and lingula are most consistent with areas of postinfectious/ postinflammatory scarring. No lung masses or significant pulmonary nodules in the aerated portions of the lungs. Musculoskeletal: No aggressive appearing focal osseous lesions. Mild-to-moderate thoracic spondylosis. Anasarca. CT ABDOMEN PELVIS FINDINGS Hepatobiliary: There is relative hypertrophy of the left liver lobe and the liver surface is diffusely irregular, consistent with cirrhosis. No gross liver mass. Normal gallbladder with no radiopaque cholelithiasis. No biliary ductal dilatation. Pancreas: Normal, with no mass or duct dilation. Spleen: Normal size. No mass. Adrenals/Urinary Tract: No discrete adrenal nodules. No hydronephrosis. No renal stones. Simple 2.3 cm posterior upper right renal cyst. Simple 3.1 cm lateral interpolar left renal cyst. No additional contour deforming renal masses. Bladder is nearly collapsed by indwelling Foley catheter, with the suggestion of diffuse bladder wall thickening. Stomach/Bowel: Grossly normal stomach. Normal caliber small bowel with no small bowel wall thickening. Faintly visualized normal appendix. There is mild dilatation throughout the large bowel with mild stool and scattered fluid levels in the large bowel. No large bowel wall thickening. Vascular/Lymphatic: Mildly atherosclerotic nonaneurysmal abdominal aorta. No gross pathologically enlarged lymph nodes in the abdomen or pelvis. Reproductive: Markedly enlarged prostate, not appreciably changed. Other: No pneumoperitoneum.  Large volume simple ascites. Musculoskeletal: No aggressive appearing focal osseous lesions. Moderate lumbar spondylosis. Anasarca. IMPRESSION: 1. Cirrhosis. No gross liver mass on this limited noncontrast CT study. Large volume ascites. Normal size spleen. 2. Mild diffuse large bowel  dilatation with colonic fluid levels, consistent with mild colonic ileus. 3. Anasarca. 4. Cardiomegaly.  Small layering right pleural effusion. 5. Prominently dilated main pulmonary artery, likely indicating pulmonary arterial hypertension. Mosaic attenuation throughout the lungs could be due to mosaic perfusion from pulmonary vascular disease versus air trapping from small airways disease. 6. Patchy bandlike areas of consolidation with associated volume loss in the lower lungs, favor atelectasis. 7. Markedly enlarged prostate. 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 Bladder nearly collapsed by indwelling Foley catheter. No hydronephrosis. 8. Aortic atherosclerosis. Electronically Signed   By:  Delbert Phenix M.D.   On: 01/24/2016 18:30   Dg Chest Port 1 View  Result Date: 01/25/2016 CLINICAL DATA:  PICC line placement.  Nasogastric tube placement. EXAM: PORTABLE CHEST 1 VIEW COMPARISON:  01/25/2016 FINDINGS: Nasogastric tube tip is in the stomach body. Right-sided PICC line tip projects over the SVC. Leftward shift of cardiac and mediastinal structures. Bilateral interstitial accentuation. Enlarged main pulmonary artery. Left costophrenic angle excluded. IMPRESSION: 1. Nasogastric tube tip in the stomach body, PICC line tip in the SVC. 2. Potential mild cardiomegaly, with some leftward shift of the heart possibly due to left-sided atelectasis. 3. Prominent main pulmonary artery, potentially with some left perihilar atelectasis. 4. Interstitial accentuation could be from atypical pneumonia or mild interstitial edema. Electronically Signed   By: Gaylyn Rong M.D.   On: 01/25/2016 18:38   Dg Chest Port 1 View  Result Date: 01/25/2016 CLINICAL DATA:  Respiratory distress,  hypotension EXAM: PORTABLE CHEST 1 VIEW COMPARISON:  10/20/ 17 FINDINGS: Cardiomegaly is noted. There is NG tube coiled within stomach with tip in proximal stomach. Elevation of  the right hemidiaphragm again noted. Persistent streaky left base retrocardiac atelectasis or infiltrate. IMPRESSION: There is NG tube coiled within stomach with tip in proximal stomach. Elevation of the right hemidiaphragm again noted. Persistent streaky left base retrocardiac atelectasis or infiltrate. Electronically Signed   By: Natasha Mead M.D.   On: 01/25/2016 10:23   Dg Abd Portable 1v  Result Date: 01/25/2016 CLINICAL DATA:  Check nasogastric catheter placement EXAM: PORTABLE ABDOMEN - 1 VIEW COMPARISON:  01/24/2016 FINDINGS: Scattered large and small bowel gas is noted. The degree of gaseous distension of the colon is stable. A nasogastric catheter is now seen within the stomach. No free air is seen. IMPRESSION: New nasogastric catheter within the stomach.  Stable colonic ileus. Electronically Signed   By: Alcide Clever M.D.   On: 01/25/2016 10:23    Scheduled Meds: . atorvastatin  40 mg Oral q1800  . ferumoxytol  510 mg Intravenous Once  . lidocaine  1 application Urethral Once  . piperacillin-tazobactam (ZOSYN)  IV  2.25 g Intravenous Q6H  . sodium chloride flush  10-40 mL Intracatheter Q12H   Continuous Infusions: . norepinephrine (LEVOPHED) Adult infusion 3 mcg/min (01/26/16 0606)  . octreotide  (SANDOSTATIN)    IV infusion 50 mcg/hr (01/26/16 0529)  . pantoprozole (PROTONIX) infusion 8 mg/hr (01/26/16 0336)     LOS: 3 days   Time spent: 35 minutes.  Hazeline Junker, MD [facility] Pager [phone]  If 7PM-7AM, please contact night-coverage [email or web address] Password [email or web address] 01/26/2016, 8:21 AM

## 2016-01-26 NOTE — Progress Notes (Signed)
CSW consulted to locate pt's next of kin. CSW informed at am rounds that CSW assistance is no longer needed.   CSW signing off.  Cori RazorJamie Ayomikun Starling LCSW [phone]

## 2016-01-26 NOTE — Progress Notes (Signed)
PT Cancellation Note  Patient Details Name: Garlon HatchetJohn W Korff MRN: [id number] DOB: Aug 26, 1940   Cancelled Treatment:    Reason Eval/Treat Not Completed: Medical issues which prohibited therapy   Rebeca AlertJannie Yaniyah Koors, MPT Pager: [phone]

## 2016-01-27 ENCOUNTER — Inpatient Hospital Stay (HOSPITAL_COMMUNITY): Payer: Medicare Other

## 2016-01-27 DIAGNOSIS — E872 Acidosis, unspecified: Secondary | ICD-10-CM

## 2016-01-27 DIAGNOSIS — M6282 Rhabdomyolysis: Secondary | ICD-10-CM

## 2016-01-27 DIAGNOSIS — J9600 Acute respiratory failure, unspecified whether with hypoxia or hypercapnia: Secondary | ICD-10-CM

## 2016-01-27 DIAGNOSIS — R579 Shock, unspecified: Secondary | ICD-10-CM

## 2016-01-27 DIAGNOSIS — R778 Other specified abnormalities of plasma proteins: Secondary | ICD-10-CM

## 2016-01-27 DIAGNOSIS — R7881 Bacteremia: Secondary | ICD-10-CM

## 2016-01-27 DIAGNOSIS — E8809 Other disorders of plasma-protein metabolism, not elsewhere classified: Secondary | ICD-10-CM

## 2016-01-27 DIAGNOSIS — I509 Heart failure, unspecified: Secondary | ICD-10-CM

## 2016-01-27 DIAGNOSIS — R7989 Other specified abnormal findings of blood chemistry: Secondary | ICD-10-CM

## 2016-01-27 DIAGNOSIS — I4510 Unspecified right bundle-branch block: Secondary | ICD-10-CM

## 2016-01-27 DIAGNOSIS — I5042 Chronic combined systolic (congestive) and diastolic (congestive) heart failure: Secondary | ICD-10-CM

## 2016-01-27 DIAGNOSIS — R748 Abnormal levels of other serum enzymes: Secondary | ICD-10-CM

## 2016-01-27 DIAGNOSIS — K922 Gastrointestinal hemorrhage, unspecified: Secondary | ICD-10-CM

## 2016-01-27 DIAGNOSIS — D689 Coagulation defect, unspecified: Secondary | ICD-10-CM

## 2016-01-27 LAB — CBC WITH DIFFERENTIAL/PLATELET
BASOS PCT: 0 %
Basophils Absolute: 0 10*3/uL (ref 0.0–0.1)
EOS ABS: 0 10*3/uL (ref 0.0–0.7)
EOS PCT: 0 %
HCT: 27.9 % — ABNORMAL LOW (ref 39.0–52.0)
HEMOGLOBIN: 9.4 g/dL — AB (ref 13.0–17.0)
Lymphocytes Relative: 2 %
Lymphs Abs: 0.3 10*3/uL — ABNORMAL LOW (ref 0.7–4.0)
MCH: 31.4 pg (ref 26.0–34.0)
MCHC: 33.7 g/dL (ref 30.0–36.0)
MCV: 93.3 fL (ref 78.0–100.0)
Monocytes Absolute: 1.2 10*3/uL — ABNORMAL HIGH (ref 0.1–1.0)
Monocytes Relative: 8 %
NEUTROS PCT: 90 %
Neutro Abs: 13.2 10*3/uL — ABNORMAL HIGH (ref 1.7–7.7)
PLATELETS: 95 10*3/uL — AB (ref 150–400)
RBC: 2.99 MIL/uL — AB (ref 4.22–5.81)
RDW: 16.7 % — ABNORMAL HIGH (ref 11.5–15.5)
WBC: 14.7 10*3/uL — AB (ref 4.0–10.5)

## 2016-01-27 LAB — COMPREHENSIVE METABOLIC PANEL
ALT: 18 U/L (ref 17–63)
ANION GAP: 12 (ref 5–15)
AST: 20 U/L (ref 15–41)
Albumin: 2.5 g/dL — ABNORMAL LOW (ref 3.5–5.0)
Alkaline Phosphatase: 50 U/L (ref 38–126)
BILIRUBIN TOTAL: 2.2 mg/dL — AB (ref 0.3–1.2)
BUN: 97 mg/dL — ABNORMAL HIGH (ref 6–20)
CO2: 25 mmol/L (ref 22–32)
Calcium: 8.5 mg/dL — ABNORMAL LOW (ref 8.9–10.3)
Chloride: 110 mmol/L (ref 101–111)
Creatinine, Ser: 2.95 mg/dL — ABNORMAL HIGH (ref 0.61–1.24)
GFR, EST AFRICAN AMERICAN: 22 mL/min — AB (ref >60)
GFR, EST NON AFRICAN AMERICAN: 19 mL/min — AB (ref >60)
Glucose, Bld: 129 mg/dL — ABNORMAL HIGH (ref 65–99)
POTASSIUM: 3.4 mmol/L — AB (ref 3.5–5.1)
Sodium: 147 mmol/L — ABNORMAL HIGH (ref 135–145)
TOTAL PROTEIN: 6.1 g/dL — AB (ref 6.5–8.1)

## 2016-01-27 LAB — ECHOCARDIOGRAM LIMITED
HEIGHTINCHES: 71 in
WEIGHTICAEL: 4620.84 [oz_av]

## 2016-01-27 LAB — PROTIME-INR
INR: 2.23
PROTHROMBIN TIME: 25.1 s — AB (ref 11.4–15.2)

## 2016-01-27 LAB — IRON AND TIBC
IRON: 108 ug/dL (ref 45–182)
Saturation Ratios: 64 % — ABNORMAL HIGH (ref 17.9–39.5)
TIBC: 168 ug/dL — AB (ref 250–450)
UIBC: 60 ug/dL

## 2016-01-27 MED ORDER — POTASSIUM CHLORIDE 10 MEQ/100ML IV SOLN
10.0000 meq | INTRAVENOUS | Status: AC
  Administered 2016-01-27 (×2): 10 meq via INTRAVENOUS
  Filled 2016-01-27 (×2): qty 100

## 2016-01-27 MED ORDER — SODIUM CHLORIDE 0.9% FLUSH
10.0000 mL | Freq: Three times a day (TID) | INTRAVENOUS | Status: DC
  Administered 2016-01-27 – 2016-01-31 (×10): 10 mL via INTRAVENOUS
  Administered 2016-01-31: 20 mL via INTRAVENOUS
  Administered 2016-01-31 – 2016-02-08 (×19): 10 mL via INTRAVENOUS

## 2016-01-27 MED ORDER — FUROSEMIDE 10 MG/ML IJ SOLN: 40.0000 mg | Freq: Once | INTRAMUSCULAR | Status: DC

## 2016-01-27 MED ORDER — PHYTONADIONE 5 MG PO TABS
5.0000 mg | ORAL_TABLET | Freq: Every day | ORAL | Status: DC
  Administered 2016-01-27 – 2016-01-30 (×3): 5 mg via ORAL
  Filled 2016-01-27 (×7): qty 1

## 2016-01-27 NOTE — Progress Notes (Signed)
Date:  January 27, 2016 Chart reviewed for concurrent status and case management needs. Will continue to follow the patient for status change: Discharge Planning: following for needs Expected discharge date: [id number] Alyha Marines, BSN, RN3, CCM   [phone] 

## 2016-01-27 NOTE — Progress Notes (Signed)
PCCM CONSULT NOTE  Admission date: 01/22/2016 Consult date: 01/25/2016 Referring provider: Dr. Jarvis Newcomer, [facility]  CC: Low blood pressure  Subjective:   He is off pressors, feels "much,much,much better!" Had 3 BMs  Vital signs: BP (!) 87/50   Pulse (!) 106   Temp 97.2 F (36.2 C) (Axillary)   Resp 13   Ht 5\' 11"  (1.803 m)   Wt 288 lb 12.8 oz (131 kg)   SpO2 98%   BMI 40.28 kg/m  4 liters Intake/output: I/O last 3 completed shifts: In: 3161 [P.O.:240; I.V.:2306; NG/GT:165; IV Piggyback:450] Out: 2540 [Urine:1840; Emesis/NG output:700]  General: alert, no focal def or complaints Neuro: normal strength, CN intact, moves all extremities HEENT: pupils reactive, NG tube in place, no stridor Cardiac: regular irregular. No murmur  Chest: Clear, decreased in both bases. No accessory use  Abd: soft, non tender, decreased BS Ext: 2+ non pitting edema, LE cellulitis  Skin: chronic stasis ulcers of lower legs b/l   CMP Latest Ref Rng & Units 01/27/2016 01/26/2016 01/26/2016  Glucose 65 - 99 mg/dL 161(W) 960(A) 540(J)  BUN 6 - 20 mg/dL 81(X) 91(Y) 78(G)  Creatinine 0.61 - 1.24 mg/dL 9.56(O) 1.30(Q) 6.57(Q)  Sodium 135 - 145 mmol/L 147(H) 147(H) 145  Potassium 3.5 - 5.1 mmol/L 3.4(L) 3.3(L) 3.3(L)  Chloride 101 - 111 mmol/L 110 112(H) 111  CO2 22 - 32 mmol/L 25 25 25   Calcium 8.9 - 10.3 mg/dL 4.6(N) 6.2(X) 5.2(W)  Total Protein 6.5 - 8.1 g/dL 6.1(L) 6.0(L) 6.4(L)  Total Bilirubin 0.3 - 1.2 mg/dL 2.2(H) 2.4(H) 2.6(H)  Alkaline Phos 38 - 126 U/L 50 51 55  AST 15 - 41 U/L 20 21 21   ALT 17 - 63 U/L 18 20 20     CBC Latest Ref Rng & Units 01/27/2016 01/26/2016 01/26/2016  WBC 4.0 - 10.5 K/uL 14.7(H) 19.1(H) 19.9(H)  Hemoglobin 13.0 - 17.0 g/dL 4.1(L) 2.4(M) 0.1(U)  Hematocrit 39.0 - 52.0 % 27.9(L) 28.7(L) 29.4(L)  Platelets 150 - 400 K/uL 95(L) 109(L) 141(L)    Lab Results  Component Value Date   INR 2.23 01/27/2016   INR 1.76 01/26/2016   INR 1.60 01/26/2016    CBG (last 3)    Recent Labs  01/26/16 0418 01/26/16 0804 01/26/16 1139  GLUCAP 115* 130* 124*     Dg Chest Port 1 View  Result Date: 01/25/2016 CLINICAL DATA:  PICC line placement.  Nasogastric tube placement. EXAM: PORTABLE CHEST 1 VIEW COMPARISON:  01/25/2016 FINDINGS: Nasogastric tube tip is in the stomach body. Right-sided PICC line tip projects over the SVC. Leftward shift of cardiac and mediastinal structures. Bilateral interstitial accentuation. Enlarged main pulmonary artery. Left costophrenic angle excluded. IMPRESSION: 1. Nasogastric tube tip in the stomach body, PICC line tip in the SVC. 2. Potential mild cardiomegaly, with some leftward shift of the heart possibly due to left-sided atelectasis. 3. Prominent main pulmonary artery, potentially with some left perihilar atelectasis. 4. Interstitial accentuation could be from atypical pneumonia or mild interstitial edema. Electronically Signed   By: Gaylyn Rong M.D.   On: 01/25/2016 18:38   Dg Chest Port 1 View  Result Date: 01/25/2016 CLINICAL DATA:  Respiratory distress,  hypotension EXAM: PORTABLE CHEST 1 VIEW COMPARISON:  10/20/ 17 FINDINGS: Cardiomegaly is noted. There is NG tube coiled within stomach with tip in proximal stomach. Elevation of the right hemidiaphragm again noted. Persistent streaky left base retrocardiac atelectasis or infiltrate. IMPRESSION: There is NG tube coiled within stomach with tip in proximal stomach. Elevation of the  right hemidiaphragm again noted. Persistent streaky left base retrocardiac atelectasis or infiltrate. Electronically Signed   By: Natasha MeadLiviu  Pop M.D.   On: 01/25/2016 10:23   Dg Abd Portable 1v  Result Date: 01/25/2016 CLINICAL DATA:  Check nasogastric catheter placement EXAM: PORTABLE ABDOMEN - 1 VIEW COMPARISON:  01/24/2016 FINDINGS: Scattered large and small bowel gas is noted. The degree of gaseous distension of the colon is stable. A nasogastric catheter is now seen within the stomach. No free air  is seen. IMPRESSION: New nasogastric catheter within the stomach.  Stable colonic ileus. Electronically Signed   By: Alcide CleverMark  Lukens M.D.   On: 01/25/2016 10:23    Studies: Echo 12/09/15 >> mild LVH, EF 45 to 50%, mild MR, mod LA dilation, mild/mod RV systolic dysfx, PAS 48 mmHg Doppler Rt arm 10/21 >> no DVT Doppler Rt leg 10/21 >> no DVT CT chest 10/21 >> small pleural effusions, mosaic pattern, ATX, RML scarring CT abd/pelvis 10/21 >> changes of liver cirrhosis, enlarged prostate, colonic ileus  Cultures: Blood 10/20 >> Morganella morganii >> S - zosyn  Urine 10/20 >> negative  Antibiotics: Vancomycin 10/20 >> 10/23 Zosyn 10/20 >> 10/23 Rocephin 10/13>>>  Events: 10/20 Admit, urology consulted for foley 10/22 Coffee ground emesis, GI consulted, given Kcentra, started levophed  Summary: [age] 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  Admitted with AKI, hypotension, hypothermia, hypoglycemia, mild rhabdomyolysis, urine retention, ileus, cellulitis.  Found to have GNR bacteremia (now spec[facility] to [facility] w/ resultant mulit-factorial shock (both sepsis and hypovolemia for GI bleeding and third spacing. He is hemodynamically stable and all issues now being addressed by IM and consult services so we will sign off.   Assessment/plan:  Shock 2nd to hypovolemia, GI bleeding and sepsis-->off pressors Anasarca/total volume overload w/ intravascular depletion  - avoid CVL in setting of coagulopathy - goal now euvolemia & if stable over today (10/24) would add back diuretics   Upper GI bleeding. Changes of cirrhosis on CT abdomen. Ileus-->seems to be resolving Protein Calorie malnutrition  - NPO - octreotide, protonix gtt - NG tube -->clamped  - GI  Following; likely start clears today  - keep K > 4  Septic shock with lower extremity cellulitis and Morganella bacteremia. - Day 5 of Abx (changed to rocephin 10/23)  Lactic  acidosis likely from sepsis and liver disease. - Resolved  AKI - baselines creatinine 1.54 from 11/21/15-->slowly improving  Hypokalemia  Hypernatremia  Urine retention with hx of BPH >> had foley placed by urology. - keep foley for now - allowing clear liquids should correct water balance  - replace electrolytes as indicated  Hx of combined CHF, WHO group 2 and 3 pulmonary hypertension. Hx of A fib. - monitor hemodynamics  Acute blood loss anemia. Mild Thrombocytopenia  Elevated INR from coumadin therapy, and liver disease. - Jehovah's Witness >> NO BLOOD PRODUCTS - limit blood draws as able  - pt willing to accept IV iron  DVT prophylaxis - SCDs SUP - Protonix gtt Goals of care - DNR/DNI  Simonne MartinetPeter E Fillmore Bynum ACNP-BC [facility] Pager # [phone] OR # [phone] if no answer

## 2016-01-27 NOTE — Progress Notes (Signed)
OT Cancellation Note  Patient Details Name: Vincent HatchetJohn W Abrell MRN: [id number] DOB: Sep 05, 1940   Cancelled Treatment:    Reason Eval/Treat Not Completed: Other (comment) -- Patient with other staff at time of OT arrival. Spoke with PT; pt with edema x 4 extremities, required +2 assistance for scoot transfer. OT will check back next day.  Aldyn Toon A 01/27/2016, 1:18 PM

## 2016-01-27 NOTE — Progress Notes (Addendum)
PROGRESS NOTE  Vincent Gibson  ZOX:[id number] DOB: 11-06-1940 DOA: 01/15/2016 PCP: Georgianne Fick, MD  Brief Narrative: Vincent Gibson is a 75 y.o. 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 He stated he was too weak to get up and had not taken any po nor urinated or defecated during that time. On arrival he was alert and oriented, dry mucous membranes and had anasarca with leg swelling R>L with evidence of cellulitis on the right leg. Abdomen was tympanitic and distended. Rectal temperature was 96.23F, glucose 26mg /dl, hypotensive and tachycardic saturating 90% on room air, 99% with 2L O2. Labs were significant for creatinine 3.32, BUN 86, bicarbonate 21, CK 528, troponin 0.36, BNP 2,155. Abdomen with chest x-ray showed abnormal increased left hilar and retrocardiac density in the chest, findings suggestive of ascites and colonic ileus. INR on arrival was supratherapeutic at 6, jumped to 10 10/21. Oral vitamin K given during the day. CT imaging showed cirrhosis (no known history of this) and large volume ascites.   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 NG tube placed, boluses, octreotide and protonix gtt started, IV vitamin K was given. BP remained low despite NS, likely third spacing IV fluid. Pt is DNR and a Jehovah's witness, refusing blood transfusions. Levophed started. After discussion with patient, verbal consent for "blood substitutes" including albumin and Kcentra was obtained, and these were given. INR has normalized and no further signs of bleeding. Levophed was weaned off 10/23.   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 Presumed variceal bleed. No varices seen on CT that was limited without contrast.  - Off levo this AM: MAP overnight 60-73. - Kcentra, vit K given: INR down to 1.29 but trending back up: Daily vitamin K.  - hgb stable, recheck labs at 3pm - Volume resuscitation largely third spaced hold further IVF - Continue levophed, titrate as able for MAP 65. - CCM and GI following: changed to ICU status - No hematemesis, trial clamp NGT now with belching: per GI  Severe sepsis from Morganella bacteremia/RLE cellulitis: Lactic acidosis resolved and trending downward (3.1 > 2.2). No evidence of DVT on initial venous doppler - Cellulitis improving on RLE very slowly - Morbanella noted in 1 of 2 blood cultures from 10/20, sensitive to CTX - Continue CTX (10/23 >> ), s/p zosyn (10/20 >> 10/23), s/p vanc 10/20 - 10/22 - Monitor blood cultures and urine culture. - WOC consult for this and other wounds that do not appear infected.  Acute renal failure/anasarca ?hepatorenal syndrome: Cr 3.32, unknown baseline (only prior creatinine was 1.5). Multifactorial: pre-renal due to dehydration from no po intake x 3 days, intrarenal suggested by U Na < 10 and pt on ARB, and postrenal with urinary retention due to BPH.  - Hold ARB and lasix, IVF not remaining intravascular - Coude catheter placed by urology in ED. - 0.54ml/kg/hr UOP, consider renal consult if no renal  recovery now that off pressors.  Chronic systolic heart failure and right heart failure: EF 45-50%, mild LVH, on recent echo read by Dr. Elease Hashimoto, with moderate pulmonary HTN (PA peak ) and right ventricle mild-mod reduced function. LE edema and distended abdomen on exam, ?congestive  hepatopathy with elevated INR and hypoalbuminemia. BNP 2155.  - Initial troponin elevated in setting of renal failure. ECG with IVCD, TWI's of unknown chronicity on admission ECG. Recheck shows downtrending.  - Limited echo per cardiology for ?Wall motion abnormalities, appreciate their recommendations. - Not anticoagulation candidate, cards consider Watchman - Hold ARB and lasix with hypotension - Hold further IVF for now - Wt up 6kg since admission, monitor daily.  Atrial fibrillation: Rate controlled.  - Hold BB now for hypotension - Hold coumadin  Acute respiratory failure: Due to sepsis. No definite pulmonary infiltrates on CT - Oxygen prn for saturations to remain > 90%  Colonic ileus: Demonstrated on abd plain films. No volvulus on CT. Clinically improving - Glycerin suppository prn. - Avoid fleet enema in renal failure.  Mild rhabdomyolysis: From 3 days laying in bathtub. Resolved. CK 528 >> 50  Severe deconditioning:  - PT/OT consulted: OK to evaluate now - With colonic ileus, significant edema and weakness, check TSH  Prolonged QTc interval: at admission - Telemetry - Monitor electrolytes. K and Mg: replete and recheck  - Minimize provocative medications as able.  Urinary retention: Severely enlarged prostate on CT in 2016.  - Foley placed by urology. Voiding trial eventually prior to discharge.   DVT prophylaxis: Supratherapeutic INR Code Status: DNR confirmed at admission and this AM Family Communication: None available. Disposition Plan: ICU  Consultants:   CCM  GI  Cardiology  Urology  Social Work  Pastoral care  Procedures:   Foley placed by Dr. Perley Jain, urology 10/20  RLE venous doppler 10/20 negative preliminary read for DVT  RUE venous doppler 10/20 negative preliminary read for SVT or DVT  Hematemesis 10/22 >> levophed started  off levophed 10/23  Antimicrobials:  Vancomycin (10/20 >> 10/22)  Zosyn (10/20 >>  10/23)  Ceftriaxone (10/23 >> )   Subjective: Pt alert, oriented, says he feels uncomfortable. Has been calling out to RN frequently, despite being more able to move arms/hands. No hematemesis, hematochezia, fevers, chest pain. Is belching this AM.   Objective: Vitals:   01/27/16 0430 01/27/16 0500 01/27/16 0530 01/27/16 0600  BP:  93/61  (!) 87/50  Pulse: 92 85 89 (!) 106  Resp: 12 (!) 6 (!) 4 13  Temp:      TempSrc:      SpO2: 97% 96% 98% 98%  Weight:      Height:        Intake/Output Summary (Last 24 hours) at 01/27/16 0739 Last data filed at 01/27/16 0300  Gross per 24 hour  Intake          1694.78 ml  Output             1130 ml  Net           564.78 ml   Filed Weights   01/25/16 0448 01/26/16 0423 01/27/16 0306  Weight: 129.8 kg (286 lb 2.5 oz) 129.5 kg (285 lb 7.9 oz) 131 kg (288 lb 12.8 oz)    Examination: General exam: 75 y.o. male in no distress HEENT: Dry mucous membranes. NG tube in right nare. Respiratory system: Nonlabored breathing with O2 by nasal cannula hovering 93-96%. Bibasilar crackles. Cardiovascular system: Irregular HR 80s. No  murmur, rub, or gallop. + JVD. Gastrointestinal system: Abdomen protuberant, less distended with hypoactive bowel sounds. Do not appreciate fluid wave. + reducible umbilical hernia. No tenderness. Central nervous system: Alert and oriented. No focal neurological deficits. Decreased strength diffusely, but still 5/5 x 4 extremities. Sensation intact. No pain with right ankle ROM.   Extremities: Warm, diffuse anasarca with weeping x4 extremities. LEs with 3+ pitting edema and bullous stasis changes. RLE with indistinct erythema anterolaterally above the ankle receding distally from demarcation. No tracking noted. No drainage or fluctuance palpated. Skin: As above.  Psychiatry: Calm.  Data Reviewed: I have personally reviewed following labs and imaging studies  CBC:  Recent Labs Lab 01/26/2016 1100  01/25/16 0814  01/25/16 2000 01/26/16 0300 01/26/16 1500 01/27/16 0600  WBC 13.3*  < > 17.2* 19.9* 19.9* 19.1* 14.7*  NEUTROABS 12.3*  --   --   --   --   --  13.2*  HGB 11.9*  < > 9.8* 10.2* 9.9* 9.7* 9.4*  HCT 34.8*  < > 28.6* 29.8* 29.4* 28.7* 27.9*  MCV 93.0  < > 92.3 94.0 94.2 93.8 93.3  PLT 205  < > 120* 135* 141* 109* 95*  < > = values in this interval not displayed. Basic Metabolic Panel:  Recent Labs Lab 01/19/2016 1100 01/24/16 0946 01/25/16 0313 01/26/16 0300 01/26/16 1500 01/27/16 0600  NA 143 143 145 145 147* 147*  K 4.3 3.5 3.4* 3.3* 3.3* 3.4*  CL 105 110 113* 111 112* 110  CO2 21* 23 24 25 25 25   GLUCOSE 90 113* 112* 152* 148* 129*  BUN 86* 87* 93* 94* 96* 97*  CREATININE 3.32* 3.30* 3.14* 2.89* 3.01* 2.95*  CALCIUM 8.9 7.9* 6.9* 8.4* 8.6* 8.5*  MG 2.4  --   --   --  2.2  --    GFR: Estimated Creatinine Clearance: 29.9 mL/min (by C-G formula based on SCr of 2.95 mg/dL (H)). Liver Function Tests:  Recent Labs Lab 01/21/2016 1100 01/25/16 0313 01/26/16 0300 01/26/16 1500 01/27/16 0600  AST 65* 25 21 21 20   ALT 29 16* 20 20 18   ALKPHOS 94 34* 55 51 50  BILITOT 4.7* 1.7* 2.6* 2.4* 2.2*  PROT 7.4 3.4* 6.4* 6.0* 6.1*  ALBUMIN 3.1* 1.4* 2.6* 2.4* 2.5*   No results for input(s): LIPASE, AMYLASE in the last 168 hours. No results for input(s): AMMONIA in the last 168 hours. Coagulation Profile:  Recent Labs Lab 01/25/16 1043 01/25/16 2000 01/26/16 0300 01/26/16 1055 01/27/16 0600  INR 1.29 1.39 1.60 1.76 2.23   Cardiac Enzymes:  Recent Labs Lab 01/27/2016 1100 01/24/16 1312 01/25/16 0313 01/26/16 1500  CKTOTAL 528*  --  225 50  TROPONINI 0.36* 0.25*  --   --    BNP (last 3 results) No results for input(s): PROBNP in the last 8760 hours. HbA1C: No results for input(s): HGBA1C in the last 72 hours. CBG:  Recent Labs Lab 01/25/16 1950 01/25/16 2326 01/26/16 0418 01/26/16 0804 01/26/16 1139  GLUCAP 116* 110* 115* 130* 124*   Lipid Profile: No  results for input(s): CHOL, HDL, LDLCALC, TRIG, CHOLHDL, LDLDIRECT in the last 72 hours. Thyroid Function Tests: No results for input(s): TSH, T4TOTAL, FREET4, T3FREE, THYROIDAB in the last 72 hours. Anemia Panel: No results for input(s): VITAMINB12, FOLATE, FERRITIN, TIBC, IRON, RETICCTPCT in the last 72 hours. Urine analysis:    Component Value Date/Time   COLORURINE AMBER (A) 01/04/2016 1507   APPEARANCEUR CLOUDY (A) 01/19/2016 1507   LABSPEC 1.018  01/20/2016 1507   PHURINE 5.5 01/15/2016 1507   GLUCOSEU NEGATIVE 01/25/2016 1507   HGBUR SMALL (A) 01/30/2016 1507   BILIRUBINUR SMALL (A) 01/05/2016 1507   KETONESUR NEGATIVE 01/22/2016 1507   PROTEINUR NEGATIVE 01/25/2016 1507   NITRITE NEGATIVE 01/21/2016 1507   LEUKOCYTESUR NEGATIVE 01/22/2016 1507   Sepsis Labs: @LABRCNTIP (procalcitonin:4,lacticidven:4)  ) Recent Results (from the past 240 hour(s))  Culture, blood (routine x 2)     Status: None (Preliminary result)   Collection Time: 01/09/2016 12:48 PM  Result Value Ref Range Status   Specimen Description BLOOD LEFT ANTECUBITAL  Final   Special Requests BOTTLES DRAWN AEROBIC AND ANAEROBIC 5 CC EA  Final   Culture   Final    NO GROWTH 3 DAYS [facility]    Report Status PENDING  Incomplete  Culture, blood (routine x 2)     Status: Abnormal   Collection Time: 02/02/2016 12:48 PM  Result Value Ref Range Status   Specimen Description BLOOD LEFT ANTECUBITAL  Final   Special Requests BOTTLES DRAWN AEROBIC AND ANAEROBIC 5 CC EA  Final   Culture  Setup Time   Final    GRAM NEGATIVE RODS ANAEROBIC BOTTLE ONLY CRITICAL RESULT CALLED TO, READ BACK BY AND VERIFIED WITH: Haze Boyden. GLOGOVAC, PHARM AT 0813 ON [id number] BY Lucienne CapersS. YARBROUGH Performed at [facility]    Culture [facility] (A)  Final   Report Status 01/26/2016 FINAL  Final   Organism ID, Bacteria MORGANELLA MORGANII  Final      Susceptibility   Morganella morganii - MIC*    AMPICILLIN >=32  RESISTANT Resistant     CEFAZOLIN >=64 RESISTANT Resistant     CEFEPIME <=1 SENSITIVE Sensitive     CEFTAZIDIME <=1 SENSITIVE Sensitive     CEFTRIAXONE <=1 SENSITIVE Sensitive     CIPROFLOXACIN <=0.25 SENSITIVE Sensitive     GENTAMICIN <=1 SENSITIVE Sensitive     IMIPENEM 4 SENSITIVE Sensitive     TRIMETH/SULFA <=20 SENSITIVE Sensitive     AMPICILLIN/SULBACTAM >=32 RESISTANT Resistant     PIP/TAZO <=4 SENSITIVE Sensitive     * MORGANELLA MORGANII  Blood Culture ID Panel (Reflexed)     Status: None   Collection Time: 01/13/2016 12:48 PM  Result Value Ref Range Status   Enterococcus species NOT DETECTED NOT DETECTED Final   Listeria monocytogenes NOT DETECTED NOT DETECTED Final   Staphylococcus species NOT DETECTED NOT DETECTED Final   Staphylococcus aureus NOT DETECTED NOT DETECTED Final   Streptococcus species NOT DETECTED NOT DETECTED Final   Streptococcus agalactiae NOT DETECTED NOT DETECTED Final   Streptococcus pneumoniae NOT DETECTED NOT DETECTED Final   Streptococcus pyogenes NOT DETECTED NOT DETECTED Final   Acinetobacter baumannii NOT DETECTED NOT DETECTED Final   Enterobacteriaceae species NOT DETECTED NOT DETECTED Final   Enterobacter cloacae complex NOT DETECTED NOT DETECTED Final   Escherichia coli NOT DETECTED NOT DETECTED Final   Klebsiella oxytoca NOT DETECTED NOT DETECTED Final   Klebsiella pneumoniae NOT DETECTED NOT DETECTED Final   Proteus species NOT DETECTED NOT DETECTED Final   Serratia marcescens NOT DETECTED NOT DETECTED Final   Haemophilus influenzae NOT DETECTED NOT DETECTED Final   Neisseria meningitidis NOT DETECTED NOT DETECTED Final   Pseudomonas aeruginosa NOT DETECTED NOT DETECTED Final   Candida albicans NOT DETECTED NOT DETECTED Final   Candida glabrata NOT DETECTED NOT DETECTED Final   Candida krusei NOT DETECTED NOT DETECTED Final   Candida parapsilosis NOT DETECTED NOT DETECTED Final  Candida tropicalis NOT DETECTED NOT DETECTED Final     Comment: Performed at [facility]  Culture, Urine     Status: None   Collection Time: 02-01-2016  5:16 PM  Result Value Ref Range Status   Specimen Description URINE, RANDOM  Final   Special Requests NONE  Final   Culture NO GROWTH Performed at [facility]   Final   Report Status 01/24/2016 FINAL  Final  MRSA PCR Screening     Status: None   Collection Time: 02/01/16  5:21 PM  Result Value Ref Range Status   MRSA by PCR NEGATIVE NEGATIVE Final    Comment:        The GeneXpert MRSA Assay (FDA approved for NASAL specimens only), is one component of a comprehensive MRSA colonization surveillance program. It is not intended to diagnose MRSA infection nor to guide or monitor treatment for MRSA infections.      Radiology Studies: Dg Chest Port 1 View  Result Date: 01/25/2016 CLINICAL DATA:  PICC line placement.  Nasogastric tube placement. EXAM: PORTABLE CHEST 1 VIEW COMPARISON:  01/25/2016 FINDINGS: Nasogastric tube tip is in the stomach body. Right-sided PICC line tip projects over the SVC. Leftward shift of cardiac and mediastinal structures. Bilateral interstitial accentuation. Enlarged main pulmonary artery. Left costophrenic angle excluded. IMPRESSION: 1. Nasogastric tube tip in the stomach body, PICC line tip in the SVC. 2. Potential mild cardiomegaly, with some leftward shift of the heart possibly due to left-sided atelectasis. 3. Prominent main pulmonary artery, potentially with some left perihilar atelectasis. 4. Interstitial accentuation could be from atypical pneumonia or mild interstitial edema. Electronically Signed   By: Gaylyn Rong M.D.   On: 01/25/2016 18:38   Dg Chest Port 1 View  Result Date: 01/25/2016 CLINICAL DATA:  Respiratory distress,  hypotension EXAM: PORTABLE CHEST 1 VIEW COMPARISON:  10/20/ 17 FINDINGS: Cardiomegaly is noted. There is NG tube coiled within stomach with tip in proximal stomach. Elevation of the right hemidiaphragm again  noted. Persistent streaky left base retrocardiac atelectasis or infiltrate. IMPRESSION: There is NG tube coiled within stomach with tip in proximal stomach. Elevation of the right hemidiaphragm again noted. Persistent streaky left base retrocardiac atelectasis or infiltrate. Electronically Signed   By: Natasha Mead M.D.   On: 01/25/2016 10:23   Dg Abd Portable 1v  Result Date: 01/25/2016 CLINICAL DATA:  Check nasogastric catheter placement EXAM: PORTABLE ABDOMEN - 1 VIEW COMPARISON:  01/24/2016 FINDINGS: Scattered large and small bowel gas is noted. The degree of gaseous distension of the colon is stable. A nasogastric catheter is now seen within the stomach. No free air is seen. IMPRESSION: New nasogastric catheter within the stomach.  Stable colonic ileus. Electronically Signed   By: Alcide Clever M.D.   On: 01/25/2016 10:23    Scheduled Meds: . atorvastatin  40 mg Oral q1800  . cefTRIAXone (ROCEPHIN)  IV  2 g Intravenous Q24H  . lidocaine  1 application Urethral Once  . phytonadione  5 mg Oral Daily  . sodium chloride flush  10-40 mL Intracatheter Q12H   Continuous Infusions: . norepinephrine (LEVOPHED) Adult infusion Stopped (01/26/16 2225)  . octreotide  (SANDOSTATIN)    IV infusion 50 mcg/hr (01/27/16 0100)  . pantoprozole (PROTONIX) infusion 8 mg/hr (01/27/16 0305)     LOS: 4 days   Time spent: 35 minutes.  Hazeline Junker, MD [facility] Pager [phone]  If 7PM-7AM, please contact night-coverage [email or web address] Password [facility] 01/27/2016, 7:39 AM

## 2016-01-27 NOTE — Progress Notes (Signed)
  Echocardiogram 2D Echocardiogram has been performed.  Arvil ChacoFoster, Arkel Cartwright 01/27/2016, 10:01 AM

## 2016-01-27 NOTE — Progress Notes (Signed)
Patient Name: Vincent Gibson Date of Encounter: 01/27/2016  Primary Cardiologist: ?Dr. [facility] Problem List     Principal Problem:   AKI (acute kidney injury) [facility]) Active Problems:   Urinary retention   Prolonged Q-T interval on ECG   Atrial fibrillation (HCC)   Chronic systolic heart failure (HCC)   Sepsis (HCC)   Cellulitis and abscess of right lower extremity   Pressure injury of skin    Subjective   States "I feel pretty good right now." No CP, SOB. Diffuse swelling noted. IM notes indicate earlier patient was calling out to nurse frequently, belching.  Inpatient Medications    . atorvastatin  40 mg Oral q1800  . cefTRIAXone (ROCEPHIN)  IV  2 g Intravenous Q24H  . lidocaine  1 application Urethral Once  . phytonadione  5 mg Oral Daily  . sodium chloride flush  10-40 mL Intracatheter Q12H    Vital Signs    Vitals:   01/27/16 0430 01/27/16 0500 01/27/16 0530 01/27/16 0600  BP:  93/61  (!) 87/50  Pulse: 92 85 89 (!) 106  Resp: 12 (!) 6 (!) 4 13  Temp:      TempSrc:      SpO2: 97% 96% 98% 98%  Weight:      Height:        Intake/Output Summary (Last 24 hours) at 01/27/16 0751 Last data filed at 01/27/16 0300  Gross per 24 hour  Intake          1694.78 ml  Output             1130 ml  Net           564.78 ml   Filed Weights   01/25/16 0448 01/26/16 0423 01/27/16 0306  Weight: 286 lb 2.5 oz (129.8 kg) 285 lb 7.9 oz (129.5 kg) 288 lb 12.8 oz (131 kg)    Physical Exam    General: Well developed, well nourished AAM, in no acute distress. NG tube in HEENT: Normocephalic, atraumatic, sclera non-icteric, no xanthomas, nares are without discharge. Neck: JVP not elevated. Lungs: Clear bilaterally to auscultation without wheezes, rales, or rhonchi. Breathing is unlabored. Cardiac: Irregularly irregular, rate controlled, S1 S2 without murmurs, rubs, or gallops.  Abdomen: Soft, protuberant but nontender.  Extremities: No clubbing or cyanosis.  Diffuse anasarca with bilateral soft puffy UE edema and 2-3+ LE edema noted, also with chronic venous stasis changes. Skin: Warm and dry, no significant rash. Neuro: Alert and oriented X 3. Strength and sensation in tact. Psych:  Responds to questions appropriately with a normal affect.  Labs    CBC  Recent Labs  01/26/16 1500 01/27/16 0600  WBC 19.1* 14.7*  NEUTROABS  --  13.2*  HGB 9.7* 9.4*  HCT 28.7* 27.9*  MCV 93.8 93.3  PLT 109* 95*   Basic Metabolic Panel  Recent Labs  01/26/16 1500 01/27/16 0600  NA 147* 147*  K 3.3* 3.4*  CL 112* 110  CO2 25 25  GLUCOSE 148* 129*  BUN 96* 97*  CREATININE 3.01* 2.95*  CALCIUM 8.6* 8.5*  MG 2.2  --    Liver Function Tests  Recent Labs  01/26/16 1500 01/27/16 0600  AST 21 20  ALT 20 18  ALKPHOS 51 50  BILITOT 2.4* 2.2*  PROT 6.0* 6.1*  ALBUMIN 2.4* 2.5*   No results for input(s): LIPASE, AMYLASE in the last 72 hours. Cardiac Enzymes  Recent Labs  01/24/16 1312 01/25/16 0313 01/26/16 1500  CKTOTAL  --  225 50  TROPONINI 0.25*  --   --     Telemetry   Atrial fib rate controlled. One 2 sec pause yesterday, no other signif pauses  Radiology    Ct Abdomen Pelvis Wo Contrast  Result Date: 01/24/2016 CLINICAL DATA:  75 year old male inpatient with a history of atrial fibrillation on Coumadin, CHF, admitted after being found down for 3 days in the bathtub, treated for sepsis and acute renal failure, now with acute respiratory failure, ileus and abdominal distention. Left lung opacities on chest radiograph. EXAM: CT CHEST, ABDOMEN AND PELVIS WITHOUT CONTRAST TECHNIQUE: Multidetector CT imaging of the chest, abdomen and pelvis was performed following the standard protocol without IV contrast. COMPARISON:  01/24/2016 abdominal radiographs and 05-Feb-2016 chest radiographs. 07/02/2014 CT abdomen/pelvis. FINDINGS: Examination is significantly limited by patient body habitus, streak artifact from the patient's upper  extremities, motion artifact and lack of IV contrast. CT CHEST FINDINGS Cardiovascular: Mild cardiomegaly. No significant pericardial fluid/thickening. Mildly atherosclerotic nonaneurysmal thoracic aorta. Dilated main pulmonary artery (3.7 cm diameter). Mediastinum/Nodes: No discrete thyroid nodules. Unremarkable esophagus. No pathologically enlarged axillary, mediastinal or gross hilar lymph nodes, noting limited sensitivity for the detection of hilar adenopathy on this noncontrast study. Lungs/Pleura: No pneumothorax. Small layering right pleural effusion. No left pleural effusion. There is layering debris in the tracheal lumen at the level of the thoracic inlet. There is a mosaic attenuation throughout both lungs. There are patchy bandlike areas of consolidation with associated volume loss in the bilateral lower lobes, favor atelectasis. Thin parenchymal bands in the right middle lobe and lingula are most consistent with areas of postinfectious/ postinflammatory scarring. No lung masses or significant pulmonary nodules in the aerated portions of the lungs. Musculoskeletal: No aggressive appearing focal osseous lesions. Mild-to-moderate thoracic spondylosis. Anasarca. CT ABDOMEN PELVIS FINDINGS Hepatobiliary: There is relative hypertrophy of the left liver lobe and the liver surface is diffusely irregular, consistent with cirrhosis. No gross liver mass. Normal gallbladder with no radiopaque cholelithiasis. No biliary ductal dilatation. Pancreas: Normal, with no mass or duct dilation. Spleen: Normal size. No mass. Adrenals/Urinary Tract: No discrete adrenal nodules. No hydronephrosis. No renal stones. Simple 2.3 cm posterior upper right renal cyst. Simple 3.1 cm lateral interpolar left renal cyst. No additional contour deforming renal masses. Bladder is nearly collapsed by indwelling Foley catheter, with the suggestion of diffuse bladder wall thickening. Stomach/Bowel: Grossly normal stomach. Normal caliber small  bowel with no small bowel wall thickening. Faintly visualized normal appendix. There is mild dilatation throughout the large bowel with mild stool and scattered fluid levels in the large bowel. No large bowel wall thickening. Vascular/Lymphatic: Mildly atherosclerotic nonaneurysmal abdominal aorta. No gross pathologically enlarged lymph nodes in the abdomen or pelvis. Reproductive: Markedly enlarged prostate, not appreciably changed. Other: No pneumoperitoneum.  Large volume simple ascites. Musculoskeletal: No aggressive appearing focal osseous lesions. Moderate lumbar spondylosis. Anasarca. IMPRESSION: 1. Cirrhosis. No gross liver mass on this limited noncontrast CT study. Large volume ascites. Normal size spleen. 2. Mild diffuse large bowel dilatation with colonic fluid levels, consistent with mild colonic ileus. 3. Anasarca. 4. Cardiomegaly.  Small layering right pleural effusion. 5. Prominently dilated main pulmonary artery, likely indicating pulmonary arterial hypertension. Mosaic attenuation throughout the lungs could be due to mosaic perfusion from pulmonary vascular disease versus air trapping from small airways disease. 6. Patchy bandlike areas of consolidation with associated volume loss in the lower lungs, favor atelectasis. 7. Markedly enlarged prostate. Chronic mild diffuse bladder wall thickening, probably due to  chronic bladder outlet obstruction. Bladder nearly collapsed by indwelling Foley catheter. No hydronephrosis. 8. Aortic atherosclerosis. Electronically Signed   By: Delbert Phenix M.D.   On: 01/24/2016 18:30   Ct Chest Wo Contrast  Result Date: 01/24/2016 CLINICAL DATA:  75 year old male inpatient with a history of atrial fibrillation on Coumadin, CHF, admitted after being found down for 3 days in the bathtub, treated for sepsis and acute renal failure, now with acute respiratory failure, ileus and abdominal distention. Left lung opacities on chest radiograph. EXAM: CT CHEST, ABDOMEN AND  PELVIS WITHOUT CONTRAST TECHNIQUE: Multidetector CT imaging of the chest, abdomen and pelvis was performed following the standard protocol without IV contrast. COMPARISON:  01/24/2016 abdominal radiographs and 01/22/2016 chest radiographs. 07/02/2014 CT abdomen/pelvis. FINDINGS: Examination is significantly limited by patient body habitus, streak artifact from the patient's upper extremities, motion artifact and lack of IV contrast. CT CHEST FINDINGS Cardiovascular: Mild cardiomegaly. No significant pericardial fluid/thickening. Mildly atherosclerotic nonaneurysmal thoracic aorta. Dilated main pulmonary artery (3.7 cm diameter). Mediastinum/Nodes: No discrete thyroid nodules. Unremarkable esophagus. No pathologically enlarged axillary, mediastinal or gross hilar lymph nodes, noting limited sensitivity for the detection of hilar adenopathy on this noncontrast study. Lungs/Pleura: No pneumothorax. Small layering right pleural effusion. No left pleural effusion. There is layering debris in the tracheal lumen at the level of the thoracic inlet. There is a mosaic attenuation throughout both lungs. There are patchy bandlike areas of consolidation with associated volume loss in the bilateral lower lobes, favor atelectasis. Thin parenchymal bands in the right middle lobe and lingula are most consistent with areas of postinfectious/ postinflammatory scarring. No lung masses or significant pulmonary nodules in the aerated portions of the lungs. Musculoskeletal: No aggressive appearing focal osseous lesions. Mild-to-moderate thoracic spondylosis. Anasarca. CT ABDOMEN PELVIS FINDINGS Hepatobiliary: There is relative hypertrophy of the left liver lobe and the liver surface is diffusely irregular, consistent with cirrhosis. No gross liver mass. Normal gallbladder with no radiopaque cholelithiasis. No biliary ductal dilatation. Pancreas: Normal, with no mass or duct dilation. Spleen: Normal size. No mass. Adrenals/Urinary Tract:  No discrete adrenal nodules. No hydronephrosis. No renal stones. Simple 2.3 cm posterior upper right renal cyst. Simple 3.1 cm lateral interpolar left renal cyst. No additional contour deforming renal masses. Bladder is nearly collapsed by indwelling Foley catheter, with the suggestion of diffuse bladder wall thickening. Stomach/Bowel: Grossly normal stomach. Normal caliber small bowel with no small bowel wall thickening. Faintly visualized normal appendix. There is mild dilatation throughout the large bowel with mild stool and scattered fluid levels in the large bowel. No large bowel wall thickening. Vascular/Lymphatic: Mildly atherosclerotic nonaneurysmal abdominal aorta. No gross pathologically enlarged lymph nodes in the abdomen or pelvis. Reproductive: Markedly enlarged prostate, not appreciably changed. Other: No pneumoperitoneum.  Large volume simple ascites. Musculoskeletal: No aggressive appearing focal osseous lesions. Moderate lumbar spondylosis. Anasarca. IMPRESSION: 1. Cirrhosis. No gross liver mass on this limited noncontrast CT study. Large volume ascites. Normal size spleen. 2. Mild diffuse large bowel dilatation with colonic fluid levels, consistent with mild colonic ileus. 3. Anasarca. 4. Cardiomegaly.  Small layering right pleural effusion. 5. Prominently dilated main pulmonary artery, likely indicating pulmonary arterial hypertension. Mosaic attenuation throughout the lungs could be due to mosaic perfusion from pulmonary vascular disease versus air trapping from small airways disease. 6. Patchy bandlike areas of consolidation with associated volume loss in the lower lungs, favor atelectasis. 7. Markedly enlarged prostate. 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 Bladder nearly collapsed by  indwelling Foley catheter. No hydronephrosis. 8. Aortic atherosclerosis. Electronically Signed   By: Delbert Phenix M.D.   On: 01/24/2016 18:30   Dg  Chest Port 1 View  Result Date: 01/25/2016 CLINICAL DATA:  PICC line placement.  Nasogastric tube placement. EXAM: PORTABLE CHEST 1 VIEW COMPARISON:  01/25/2016 FINDINGS: Nasogastric tube tip is in the stomach body. Right-sided PICC line tip projects over the SVC. Leftward shift of cardiac and mediastinal structures. Bilateral interstitial accentuation. Enlarged main pulmonary artery. Left costophrenic angle excluded. IMPRESSION: 1. Nasogastric tube tip in the stomach body, PICC line tip in the SVC. 2. Potential mild cardiomegaly, with some leftward shift of the heart possibly due to left-sided atelectasis. 3. Prominent main pulmonary artery, potentially with some left perihilar atelectasis. 4. Interstitial accentuation could be from atypical pneumonia or mild interstitial edema. Electronically Signed   By: Gaylyn Rong M.D.   On: 01/25/2016 18:38   Dg Chest Port 1 View  Result Date: 01/25/2016 CLINICAL DATA:  Respiratory distress,  hypotension EXAM: PORTABLE CHEST 1 VIEW COMPARISON:  10/20/ 17 FINDINGS: Cardiomegaly is noted. There is NG tube coiled within stomach with tip in proximal stomach. Elevation of the right hemidiaphragm again noted. Persistent streaky left base retrocardiac atelectasis or infiltrate. IMPRESSION: There is NG tube coiled within stomach with tip in proximal stomach. Elevation of the right hemidiaphragm again noted. Persistent streaky left base retrocardiac atelectasis or infiltrate. Electronically Signed   By: Natasha Mead M.D.   On: 01/25/2016 10:23   Dg Abd Acute W/chest  Result Date: 01/05/2016 CLINICAL DATA:  76 year old male with abdominal distention. Initial encounter. EXAM: DG ABDOMEN ACUTE W/ 1V CHEST COMPARISON:  CT Abdomen and Pelvis 06/24/2014 and earlier. FINDINGS: Chronic cardiomegaly appears not significantly changed since 2011. However, there is abnormal increased left hilar and retrocardiac density. No pneumothorax or pleural effusion. Crowding of lung  markings elsewhere. No edema suspected. Large body habitus. No pneumoperitoneum. Supine and left-side-down lateral decubitus views of the abdomen are provided. Mild to moderate gaseous distension of the stomach and throughout the large bowel. Several gas-filled but nondilated small bowel loops are noted. Paucity of gas in the rectum. Furthermore there is a hazy gray opacity throughout the abdomen. This can be seen with ascites. No acute or suspicious osseous lesion identified. IMPRESSION: 1. Abnormal increased left hilar and retrocardiac density in the chest. PA and lateral chest radiographs would be helpful when possible. Failing that, Chest CT (IV contrast preferred) would be recommended. 2. Wallace Cullens hazy opacity throughout the abdomen suggesting Ascites. 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 3. Gas distended stomach and large bowel. Paucity of gas in the distal sigmoid colon and rectum. Small bowel loops nondilated at this time. Differential considerations include ileus and distal colon obstruction. Note that the prostate was severely enlarged on the 2016 CT Abdomen and Pelvis. Electronically Signed   By: Odessa Fleming M.D.   On: 01/21/2016 12:03   Dg Abd Portable 1v  Result Date: 01/25/2016 CLINICAL DATA:  Check nasogastric catheter placement EXAM: PORTABLE ABDOMEN - 1 VIEW COMPARISON:  01/24/2016 FINDINGS: Scattered large and small bowel gas is noted. The degree of gaseous distension of the colon is stable. A nasogastric catheter is now seen within the stomach. No free air is seen. IMPRESSION: New nasogastric catheter within the stomach.  Stable colonic ileus. Electronically Signed   By: Alcide Clever M.D.   On: 01/25/2016 10:23   Dg Abd Portable 1v  Result Date: 01/24/2016 CLINICAL  DATA:  Ileus EXAM: PORTABLE ABDOMEN - 1 VIEW COMPARISON:  01/17/2016 FINDINGS: Scattered large and small bowel gas is noted. Gas distension of the colon is again identified and stable from the  prior exam. The overall appearance is similar to that seen on the previous day. No free air is noted. Foley catheter is noted. IMPRESSION: Changes consistent with a colonic ileus. Correlation with physical exam is recommended. No significant change from the previous day is seen. Electronically Signed   By: Alcide Clever M.D.   On: 01/24/2016 07:16     Patient Profile     58M Jehovah's witness (?possibly followed by Dr. 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 Found to have acute renal failure, mild rhabdomyolysis, septic shock with lactic acidosis, lower extremity cellulitis, Morganella bacteremia, coagulopathy with INR >10, prolonged QTC. Developed coffee ground emesis 01/25/16 with acute GIB, progressive anemia/thrombocytopenia and hypernatremia. CT abd showing cirrhosis, anasarca, and markedly enlarged prostate with probable chronic bladder outlet obstruction.  Assessment & Plan    1. Elevated troponin - likely demand ischemia in multiple ongoing medical issues. Current state will not allow for further ischemic eval. We can revisit based on clinical trajectory. No acute CP or SOB today.  2. Multifactorial shock - on pressors, in setting of intravascular volume depletion, sepsis and GI bleeding, being managed by PCCM.   3. Chronic combined CHF - f/u echo pending. Some of third spacing likely due to hypoalbuminemia and finding of cirrhosis on imaging. IVF have been stopped. Cannot actively diurese due to continued hypertension but suspect will need IV Lasix in the coming future.  4. 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 IM treating with vitamin K given recurrent rise in INR off Coumadin. Not  likely to be a candidate for resumption anytime soon given presumed variceal bleed and patient's refusal of PRN blood products. Plan continued rate control for now given his interruption in anticoagulation. Add screening thyroid in AM.  5. AKI with hyopkalemia - per IM.   Signed, Laurann Montana, PA-C  01/27/2016, 7:51 AM

## 2016-01-27 NOTE — Progress Notes (Signed)
Pt has moaned frequently during shift. When asked he states needs to reposition. We have repositioned very often sometimes every 30 mins he will call. He denies "pain" just wants to reposition. He seems more agitated than the last couple of nights, not as easy to make comfortable. He able to move arms now and get himself ice and phone and make calls

## 2016-01-27 NOTE — Evaluation (Signed)
Physical Therapy Evaluation Patient Details Name: Vincent HatchetJohn W Gibson MRN: [id number] DOB: 1940/05/28 Today's Date: 01/27/2016   History of Present Illness  Pt is a 75 y.o. male with medical history significant of hypertension, hyperlipidemia, atrial fibrillation, systolic heart failure, EF of 40-98%45-50%, moderate pulmonary hypertension and mild right heart failure admitted for acute kidney injury after remaining in his bathtub for 3-4 days due to inability to get out.  Clinical Impression  Pt admitted with above. Pt currently with functional limitations due to the deficits listed below (see PT Problem List). Pt will benefit from skilled PT to increase their independence with mobility to allow discharge. Pt required verbal cues throughout evaluation to remain engaged and performing as much as he possibly could. Pt able to perform bed mobility and transfers with at least mod assist of 2. Difficult to accurately assess strength due to edema. Pt reports using rollator for ambulation/stairs. Planning to try RW with pt if standing/walking attempted at next visit to provide more stable option for mobility. Pt agreeable to seeking SNF placement to address strength impairments and activity intolerance.     Follow Up Recommendations SNF;Supervision for mobility/OOB    Equipment Recommendations  Other (comment);Rolling walker with 5" wheels (TBD after standing/gait assessment when pt ready)    Recommendations for Other Services       Precautions / Restrictions Precautions Precautions: Fall Restrictions Weight Bearing Restrictions: No      Mobility  Bed Mobility Overal bed mobility: Needs Assistance;+2 for physical assistance Bed Mobility: Supine to Sit     Supine to sit: Mod assist;Min assist;+2 for physical assistance     General bed mobility comments: mod assist from student PT/min assist from rehab tech; assistance for trunk, pt able to position legs at EOB with no assistance; verbal cues for  safe technique and to remain focused on performing as much as possible  Transfers Overall transfer level: Needs assistance Equipment used: None Transfers: Lateral/Scoot Transfers          Lateral/Scoot Transfers: Mod assist;+2 physical assistance General transfer comment: pt able to participate and self-assist with transfer; assistance for lateral scooting; verbal cues for UE positioning and direction of scoot  Ambulation/Gait                Stairs            Wheelchair Mobility    Modified Rankin (Stroke Patients Only)       Balance Overall balance assessment: No apparent balance deficits (not formally assessed);Needs assistance (unable to assess standing balance today; no history of falls)     Sitting balance - Comments: able to maintain sitting balance with no UE support for 2-3 min; required verbal cues only to focus on task and continue participating in active sitting balance                                     Pertinent Vitals/Pain Pain Assessment: 0-10 Pain Score: 7  Pain Location: buttocks Pain Descriptors / Indicators: Discomfort;Sore Pain Intervention(s): Limited activity within patient's tolerance;Monitored during session;Repositioned    Home Living Family/patient expects to be discharged to:: Private residence Living Arrangements: Alone Available Help at Discharge: Friend(s);Available PRN/intermittently Type of Home: Apartment Home Access: Stairs to enter Entrance Stairs-Rails:  (uses rollator to ascend/descend stairs) Entrance Stairs-Number of Steps: 12 (flight) Home Layout: One level Home Equipment: Other (comment) (rollator) Additional Comments: pt lives alone, relies on public transportation  in community, reports that friends are available to help PRn    Prior Function Level of Independence: Independent with assistive device(s)               Hand Dominance        Extremity/Trunk Assessment   Upper Extremity  Assessment: Generalized weakness (able to perform shoulder flexion approx WNL)           Lower Extremity Assessment: Generalized weakness (difficult to assess due to edema)         Communication   Communication: No difficulties (impulsive speech and outbursts (non-aggressive))  Cognition Arousal/Alertness: Awake/alert Behavior During Therapy: WFL for tasks assessed/performed (cues to remain focused on task/doing as much as possible) Overall Cognitive Status: Within Functional Limits for tasks assessed                      General Comments      Exercises     Assessment/Plan    PT Assessment Patient needs continued PT services  PT Problem List Decreased strength;Decreased activity tolerance;Decreased mobility;Decreased safety awareness          PT Treatment Interventions DME instruction;Gait training;Functional mobility training;Therapeutic activities;Therapeutic exercise;Patient/family education    PT Goals (Current goals can be found in the Care Plan section)  Acute Rehab PT Goals Patient Stated Goal: to be able to function independently in the community PT Goal Formulation: With patient Time For Goal Achievement: 02/10/16 Potential to Achieve Goals: Fair (dependent upon pt motivation with mobility)    Frequency Min 3X/week   Barriers to discharge        Co-evaluation               End of Session Equipment Utilized During Treatment: Oxygen Activity Tolerance: Patient tolerated treatment well;Patient limited by fatigue (slightly limited by fatigue; unsure of how motivated pt is) Patient left: in chair;with call bell/phone within reach;with family/visitor present Nurse Communication: Mobility status         Time: 9604-5409 PT Time Calculation (min) (ACUTE ONLY): 25 min   Charges:   PT Evaluation $PT Eval Moderate Complexity: 1 Procedure     PT G CodesKaralee Height 02/13/16, 2:00 PM Karalee Height, SPT

## 2016-01-27 NOTE — Progress Notes (Signed)
[facility] PROGRESS NOTE Subjective patient has NG tube Vincent Gibson is not had any really gross bleeding. Did receive  Objective: Vital signs in last 24 hours: Temp:  [97.1 F (36.2 C)-97.6 F (36.4 C)] 97.6 F (36.4 C) (10/24 0800) Pulse Rate:  [44-113] 89 (10/24 0900) Resp:  [0-18] 7 (10/24 0900) BP: (83-118)/(42-70) 91/58 (10/24 0900) SpO2:  [85 %-100 %] 98 % (10/24 0900) Weight:  [131 kg (288 lb 12.8 oz)] 131 kg (288 lb 12.8 oz) (10/24 0306) Last BM Date: 01/24/16  Intake/Output from previous day: 10/23 0701 - 10/24 0700 In: 1844.8 [P.O.:240; I.V.:1194.8; NG/GT:60; IV Piggyback:350] Out: 1260 [Urine:1160; Emesis/NG output:100] Intake/Output this shift: No intake/output data recorded.  PE: General-- alert male no distress NG tube is clamped  Abdomen-- obese soft and nontender  Lab Results:  Recent Labs  01/25/16 0814 01/25/16 2000 01/26/16 0300 01/26/16 1500 01/27/16 0600  WBC 17.2* 19.9* 19.9* 19.1* 14.7*  HGB 9.8* 10.2* 9.9* 9.7* 9.4*  HCT 28.6* 29.8* 29.4* 28.7* 27.9*  PLT 120* 135* 141* 109* 95*   BMET  Recent Labs  01/25/16 0313 01/26/16 0300 01/26/16 1500 01/27/16 0600  NA 145 145 147* 147*  K 3.4* 3.3* 3.3* 3.4*  CL 113* 111 112* 110  CO2 24 25 25 25   CREATININE 3.14* 2.89* 3.01* 2.95*   LFT  Recent Labs  01/26/16 0300 01/26/16 1500 01/27/16 0600  PROT 6.4* 6.0* 6.1*  AST 21 21 20   ALT 20 20 18   ALKPHOS 55 51 50  BILITOT 2.6* 2.4* 2.2*   PT/INR  Recent Labs  01/26/16 0300 01/26/16 1055 01/27/16 0600  LABPROT 19.2* 20.8* 25.1*  INR 1.60 1.76 2.23   PANCREAS No results for input(s): LIPASE in the last 72 hours.       Studies/Results: Dg Chest Port 1 View  Result Date: 01/25/2016 CLINICAL DATA:  PICC line placement.  Nasogastric tube placement. EXAM: PORTABLE CHEST 1 VIEW COMPARISON:  01/25/2016 FINDINGS: Nasogastric tube tip is in the stomach body. Right-sided PICC line tip projects over the SVC. Leftward  shift of cardiac and mediastinal structures. Bilateral interstitial accentuation. Enlarged main pulmonary artery. Left costophrenic angle excluded. IMPRESSION: 1. Nasogastric tube tip in the stomach body, PICC line tip in the SVC. 2. Potential mild cardiomegaly, with some leftward shift of the heart possibly due to left-sided atelectasis. 3. Prominent main pulmonary artery, potentially with some left perihilar atelectasis. 4. Interstitial accentuation could be from atypical pneumonia or mild interstitial edema. Electronically Signed   By: Gaylyn RongWalter  Liebkemann M.D.   On: 01/25/2016 18:38    Medications: I have reviewed the patient's current medications.  Assessment/Plan: 1. G.I. bleed. Appears to be stable. The patient did receive one dose of IV iron yesterday and should receive another dose in several days. His hemoglobin is down just a little but does not drop significantly. He is tolerating clamping his NG tube well. We will go ahead and start him on ice chips and popsicles with advancement to clear liquids if he tolerates. He will need an EGD at some point. Will continue IV Protonix and octreotide for now and consider EGD tomorrow more stable. Unfortunately, his creatinine remains elevated.   Vincent Gibson 01/27/2016, 9:51 AM  This note was created using voice recognition software. Minor errors may Have occurred unintentionally.  Pager: [phone] If no answer or after hours call [phone]

## 2016-01-28 ENCOUNTER — Encounter (HOSPITAL_COMMUNITY): Payer: Self-pay

## 2016-01-28 ENCOUNTER — Encounter (HOSPITAL_COMMUNITY): Admission: EM | Disposition: E | Payer: Self-pay | Source: Home / Self Care | Attending: Internal Medicine

## 2016-01-28 ENCOUNTER — Inpatient Hospital Stay (HOSPITAL_COMMUNITY): Payer: Medicare Other | Admitting: Anesthesiology

## 2016-01-28 DIAGNOSIS — I4510 Unspecified right bundle-branch block: Secondary | ICD-10-CM

## 2016-01-28 DIAGNOSIS — E872 Acidosis: Secondary | ICD-10-CM

## 2016-01-28 DIAGNOSIS — D689 Coagulation defect, unspecified: Secondary | ICD-10-CM

## 2016-01-28 DIAGNOSIS — R140 Abdominal distension (gaseous): Secondary | ICD-10-CM

## 2016-01-28 HISTORY — PX: ESOPHAGOGASTRODUODENOSCOPY: SHX5428

## 2016-01-28 LAB — COMPREHENSIVE METABOLIC PANEL
ALBUMIN: 2.5 g/dL — AB (ref 3.5–5.0)
ALK PHOS: 49 U/L (ref 38–126)
ALT: 17 U/L (ref 17–63)
ANION GAP: 7 (ref 5–15)
AST: 21 U/L (ref 15–41)
BUN: 96 mg/dL — AB (ref 6–20)
CALCIUM: 8.5 mg/dL — AB (ref 8.9–10.3)
CO2: 26 mmol/L (ref 22–32)
Chloride: 114 mmol/L — ABNORMAL HIGH (ref 101–111)
Creatinine, Ser: 2.78 mg/dL — ABNORMAL HIGH (ref 0.61–1.24)
GFR calc Af Amer: 24 mL/min — ABNORMAL LOW (ref >60)
GFR calc non Af Amer: 21 mL/min — ABNORMAL LOW (ref >60)
GLUCOSE: 122 mg/dL — AB (ref 65–99)
Potassium: 3.3 mmol/L — ABNORMAL LOW (ref 3.5–5.1)
SODIUM: 147 mmol/L — AB (ref 135–145)
Total Bilirubin: 2.3 mg/dL — ABNORMAL HIGH (ref 0.3–1.2)
Total Protein: 6.2 g/dL — ABNORMAL LOW (ref 6.5–8.1)

## 2016-01-28 LAB — CBC
HEMATOCRIT: 30.1 % — AB (ref 39.0–52.0)
HEMOGLOBIN: 10 g/dL — AB (ref 13.0–17.0)
MCH: 31.6 pg (ref 26.0–34.0)
MCHC: 33.2 g/dL (ref 30.0–36.0)
MCV: 95.3 fL (ref 78.0–100.0)
Platelets: 103 10*3/uL — ABNORMAL LOW (ref 150–400)
RBC: 3.16 MIL/uL — ABNORMAL LOW (ref 4.22–5.81)
RDW: 17 % — AB (ref 11.5–15.5)
WBC: 11.7 10*3/uL — ABNORMAL HIGH (ref 4.0–10.5)

## 2016-01-28 LAB — CULTURE, BLOOD (ROUTINE X 2): Culture: NO GROWTH

## 2016-01-28 LAB — MAGNESIUM: Magnesium: 2.1 mg/dL (ref 1.7–2.4)

## 2016-01-28 LAB — NO BLOOD PRODUCTS

## 2016-01-28 LAB — TSH: TSH: 0.709 u[IU]/mL (ref 0.350–4.500)

## 2016-01-28 LAB — GLUCOSE, CAPILLARY: GLUCOSE-CAPILLARY: 101 mg/dL — AB (ref 65–99)

## 2016-01-28 LAB — PROTIME-INR
INR: 2.8
Prothrombin Time: 30.1 seconds — ABNORMAL HIGH (ref 11.4–15.2)

## 2016-01-28 SURGERY — EGD (ESOPHAGOGASTRODUODENOSCOPY)
Anesthesia: Monitor Anesthesia Care

## 2016-01-28 MED ORDER — PROPOFOL 10 MG/ML IV BOLUS
INTRAVENOUS | Status: AC
  Filled 2016-01-28: qty 20

## 2016-01-28 MED ORDER — LACTATED RINGERS IV SOLN
INTRAVENOUS | Status: DC | PRN
  Administered 2016-01-28: 12:00:00 via INTRAVENOUS

## 2016-01-28 MED ORDER — SODIUM CHLORIDE 0.9 % IV SOLN
INTRAVENOUS | Status: DC
  Administered 2016-01-28: 08:00:00 via INTRAVENOUS

## 2016-01-28 MED ORDER — SUCRALFATE 1 GM/10ML PO SUSP
1.0000 g | Freq: Three times a day (TID) | ORAL | Status: DC
  Administered 2016-01-28 – 2016-02-08 (×40): 1 g via ORAL
  Filled 2016-01-28 (×39): qty 10

## 2016-01-28 MED ORDER — PROPOFOL 500 MG/50ML IV EMUL
INTRAVENOUS | Status: DC | PRN
  Administered 2016-01-28 (×2): 20 mg via INTRAVENOUS

## 2016-01-28 MED ORDER — SODIUM CHLORIDE 0.9 % IV SOLN
8.0000 mg/h | INTRAVENOUS | Status: DC
  Administered 2016-01-28 – 2016-01-29 (×3): 8 mg/h via INTRAVENOUS
  Filled 2016-01-28 (×8): qty 80

## 2016-01-28 MED ORDER — PHENYLEPHRINE HCL 10 MG/ML IJ SOLN
INTRAMUSCULAR | Status: DC | PRN
  Administered 2016-01-28: 80 ug via INTRAVENOUS

## 2016-01-28 MED ORDER — POTASSIUM CHLORIDE 10 MEQ/100ML IV SOLN
10.0000 meq | INTRAVENOUS | Status: AC
  Administered 2016-01-28 (×3): 10 meq via INTRAVENOUS
  Filled 2016-01-28 (×3): qty 100

## 2016-01-28 MED ORDER — SODIUM CHLORIDE 0.9 % IV SOLN: 8.0000 mg/h | INTRAVENOUS | Status: DC

## 2016-01-28 NOTE — Anesthesia Preprocedure Evaluation (Addendum)
Anesthesia Evaluation  Patient identified by MRN, date of birth, ID band Patient awake    Reviewed: Allergy & Precautions, NPO status , Patient's Chart, lab work & pertinent test results  Airway Mallampati: II  TM Distance: >3 FB Neck ROM: Full    Dental no notable dental hx.    Pulmonary neg pulmonary ROS,  Denies significant SOB. Oxygen saturation of 100% on Nasal Cannula.   Pulmonary exam normal breath sounds clear to auscultation       Cardiovascular hypertension, Pt. on medications +CHF  Normal cardiovascular exam+ dysrhythmias  Rhythm:Regular Rate:Normal  Cardiology consult note reviewed. Patient volume overloaded. Pulmonary HTN  ECHO with normal EF  Study Conclusions  - Left ventricle: The cavity size was normal. There was severe   asymmetric hypertrophy of the septum. Systolic function was   normal. The estimated ejection fraction was in the range of 55%   to 60%. Wall motion was normal; there were no regional wall   motion abnormalities. Doppler parameters are consistent with   restrictive physiology, indicative of decreased left ventricular   diastolic compliance and/or increased left atrial pressure. - Ventricular septum: The contour showed diastolic flattening and   systolic flattening. - Aortic valve: There was trivial regurgitation. - Mitral valve: There was mild regurgitation. - Left atrium: The atrium was mildly dilated. - Right ventricle: The cavity size was severely dilated. Systolic   function was moderately reduced. - Right atrium: The atrium was severely dilated. - Atrial septum: The septum bowed from right to left, consistent   with increased right atrial pressure. - Pulmonary arteries: Systolic pressure was severely increased. PA   peak pressure: 75 mm Hg (S). - Line: A venous catheter was visualized with its tip in the right   atrium. - Pericardium, extracardiac: A small pericardial effusion  was   identified.   Neuro/Psych  Neuromuscular disease negative psych ROS   GI/Hepatic negative GI ROS, Neg liver ROS,   Endo/Other  Morbid obesity  Renal/GU Renal InsufficiencyRenal diseaseCr 2.78  K 3.3  negative genitourinary   Musculoskeletal negative musculoskeletal ROS (+)   Abdominal (+) + obese,   Peds negative pediatric ROS (+)  Hematology  (+) anemia , REFUSES BLOOD PRODUCTS, JEHOVAH'S WITNESS  Anesthesia Other Findings   Reproductive/Obstetrics negative OB ROS                         Anesthesia Physical Anesthesia Plan  ASA: IV  Anesthesia Plan: MAC   Post-op Pain Management:    Induction: Intravenous  Airway Management Planned: Natural Airway  Additional Equipment:   Intra-op Plan:   Post-operative Plan:   Informed Consent: I have reviewed the patients History and Physical, chart, labs and discussed the procedure including the risks, benefits and alternatives for the proposed anesthesia with the patient or authorized representative who has indicated his/her understanding and acceptance.   Dental advisory given  Plan Discussed with: CRNA  Anesthesia Plan Comments: (High risk situation but patient and doctors need a better diagnosis of blood loss (e.g. varices versus gastritis) Discussed increased risk of MAC with Dr. Randa EvensEdwards. Plan light IV sedation as tolerated. Patient and Dr. Randa EvensEdwards consent to plan.)     Anesthesia Quick Evaluation

## 2016-01-28 NOTE — Progress Notes (Signed)
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 He stated he was too weak to get up and had not taken any po nor urinated or defecated during that time. On arrival he was alert and oriented, dry mucous membranes and had anasarca with leg swelling R>L with evidence of cellulitis on the right leg. Abdomen was tympanitic and distended. Rectal temperature was 96.42F, glucose 26mg /dl, hypotensive and tachycardic saturating 90% on room air, 99% with 2L O2. Labs were significant for creatinine 3.32, BUN 86, bicarbonate 21, CK 528, troponin 0.36, BNP 2,155. Abdomen with chest x-ray showed abnormal increased left hilar and retrocardiac density in the chest, findings suggestive of ascites and colonic ileus. INR on arrival was supratherapeutic at 6, jumped to 10 10/21. Oral vitamin K given during the day. CT imaging showed cirrhosis (no known history of this) and large volume ascites.   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 NG tube placed, boluses, octreotide and protonix gtt started, IV vitamin K was given. BP remained low despite NS, likely third spacing IV fluid. Pt is DNR and a Jehovah's witness, refusing blood transfusions. Levophed started. After discussion with patient, verbal consent for "blood substitutes" including albumin and Kcentra was obtained, and these were given. INR has normalized and no further signs of bleeding. Levophed was weaned off 10/23.  Assessment & Plan:   Principal Problem:   Sepsis (HCC) Active Problems:   AKI (acute kidney injury) (HCC)   Urinary retention   Prolonged Q-T interval on ECG   Atrial fibrillation (HCC)   Chronic combined  systolic and diastolic CHF (congestive heart failure) (HCC)   Cellulitis and abscess of right lower extremity   Pressure injury of skin   Rhabdomyolysis   GI bleeding   Coagulopathy (HCC)   Lactic acidosis   Elevated troponin   Shock (HCC)   Hypoalbuminemia   RBBB   Bacteremia   Acute respiratory failure (HCC)   Hypovolemic shock due to hematemesis with acute blood loss anemia:   -Patient with concerns for auto anticoagulation 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 Randa EvensEdwards, no evidence of varices, however patient does have severe gastritis and healing duodenal ulcer. Per GI, recommendation for PPI infusion and discontinue octreotide at this time.  -We'll continue to monitor closely. Follow CBC . -Patient's religious believes,patient is unable to receive blood transfusion -INR continues to worsen and is just below 3 today. Discussed with GI, plan for IV vitamin K -Repeat CBC in the morning  Severe sepsis from Morganella bacteremia/RLE cellulitis:  -Lactic acidosis resolved  -Patient noted to have  1/2 morbanella species and blood cultures sensitive to Rocephin.  -Antibiotics noted below  Acute renal failure/anasarca ?hepatorenal syndrome:  -Presenting creatinine of 3.32, uncertain baseline -Consideration for prerenal disease. -Creatinine has improved to 2.78 today. -We'll repeat, comprehensive metabolic panel morning  Chronic systolic heart failure and right heart failure:  -Patient with EF of 45-50% with mild LVH on 2-D echo -Cardiology is following, appreciate input -Per cardiology, consideration for resuming diuresis tomorrow if renal function and blood pressure tolerates. -At this time, patient remains markedly volume overloaded.  Atrial  fibrillation:  -Patient remains rate controlled at present -Currently not candidate for anticoagulation given concerns for acute  blood loss anemia -Beta blocker remains on hold secondary to recent hypotension requiring pressors  Acute respiratory failure:  -Remains stable on 2 L nasal cannula -Continue to wean O2 as tolerated  Colonic ileus:  -Noted on recent CT scan -Clinically improved -GI is following -Agree to avoid fleets enema secondary to renal failure  Mild rhabdomyolysis:  -Patient found laying in bathtub prior to admission -Rhabdomyolysis has since resolved with normalization of CK  Severe deconditioning:  -PT/OT consulted, thus far with recs for SNF  Prolonged QTc interval:  -Presenting QTC of 537. -Patient is continued on telemetry -Continue to ensure normalization of electrolytes  Urinary retention:  -Markedly enlarged prostate was seen on CT scan. -Patient is now with indwelling Foley catheter that was placed by urology. -Eventually plan voiding trial prior to discharge.  Coagulopathy -Per above, patient's INR continues to trend up despite discontinuation of Coumadin and after receiving vitamin K. -I discussed case with Dr. Randa Evens. Agree with trial of IV vitamin K. -Patient is Jehovah's Witness, therefore likely unable to give FFP if needed -We'll repeat coag panel morning. Repeat CBC  DVT prophylaxis: We'll place SCDs Code Status: DO NOT RESUSCITATE Family Communication: Patient in room, family not at bedside Disposition Plan: Uncertain at this time  Consultants:   Gastroenterology  Cardiology  Pulmonary critical care  Procedures:   Foley placement by Dr. McDiarmid [facility] on 01/16/2016  Right lower extremity Doppler ultrasound 01/31/2016 negative for DVT  Right upper extremity Doppler ultrasound on 01/20/2016 negative for SVT or DVT  Patient developed hematemesis on 01/25/2016, initiated Levothroid until 01/26/2016  EGD by gastroenterology on February 04, 2016  Antimicrobials: Anti-infectives    Start     Dose/Rate Route Frequency Ordered Stop   01/26/16 1000   cefTRIAXone (ROCEPHIN) 2 g in dextrose 5 % 50 mL IVPB     2 g 100 mL/hr over 30 Minutes Intravenous Every 24 hours 01/26/16 0849     01/25/16 1600  vancomycin (VANCOCIN) 1,500 mg in sodium chloride 0.9 % 500 mL IVPB  Status:  Discontinued     1,500 mg 250 mL/hr over 120 Minutes Intravenous Every 48 hours 02/02/2016 1450 01/24/16 1048   01/24/16 1600  vancomycin (VANCOCIN) 1,500 mg in sodium chloride 0.9 % 500 mL IVPB  Status:  Discontinued     1,500 mg 250 mL/hr over 120 Minutes Intravenous Every 48 hours 01/24/16 1048 01/26/16 0816   01/14/2016 2200  piperacillin-tazobactam (ZOSYN) IVPB 2.25 g  Status:  Discontinued     2.25 g 100 mL/hr over 30 Minutes Intravenous Every 6 hours 02/03/2016 1450 01/26/16 0849   01/08/2016 1445  vancomycin (VANCOCIN) 1,500 mg in sodium chloride 0.9 % 500 mL IVPB  Status:  Discontinued     1,500 mg 250 mL/hr over 120 Minutes Intravenous STAT 01/08/2016 1440 01/24/16 1048   01/30/2016 1430  piperacillin-tazobactam (ZOSYN) IVPB 3.375 g     3.375 g 100 mL/hr over 30 Minutes Intravenous  Once 01/12/2016 1420 01/20/2016 1508   01/19/2016 1430  vancomycin (VANCOCIN) IVPB 1000 mg/200 mL premix  Status:  Discontinued     1,000 mg 200 mL/hr over 60 Minutes Intravenous  Once 01/15/2016 1420 01/13/2016 1608      Subjective: No complaints at this time  Objective: Vitals:   02/04/16 1159 02/04/2016 1252 02-04-2016 1300 02/04/2016 1310  BP: (!) 100/47 (!) 99/54 120/60 116/70  Pulse: 93 87 91 (!) 41  Resp: 11 (!) 8 14 (!) 8  Temp:  97.5 F (36.4 C)    TempSrc:  Oral    SpO2: 100% 100% 100% 99%  Weight:      Height:        Intake/Output Summary (Last 24 hours) at 01/19/2016 1647 Last data filed at 01/05/2016 1246  Gross per 24 hour  Intake             1480 ml  Output             1325 ml  Net              155 ml   Filed Weights   01/26/16 0423 01/27/16 0306 01/16/2016 0322  Weight: 129.5 kg (285 lb 7.9 oz) 131 kg (288 lb 12.8 oz) 131 kg (288 lb 12.8 oz)    Examination:  General  exam: Appears calm and comfortable  Respiratory system: Clear to auscultation. Respiratory effort normal. Cardiovascular system: S1 & S2 heard, RRR. Gastrointestinal system: Abdomen is nondistended, soft and nontender. No organomegaly or masses felt. Decreased bowel sounds Central nervous system: Alert and oriented. No focal neurological deficits. Extremities: Symmetric 5 x 5 power, 2-3+ pitting lower extremity edema bilaterally. Skin: No rashes, lesions or ulcers Psychiatry: Judgement and insight appear normal. Mood & affect appropriate.   Data Reviewed: I have personally reviewed following labs and imaging studies  CBC:  Recent Labs Lab 02/01/2016 1100  01/25/16 2000 01/26/16 0300 01/26/16 1500 01/27/16 0600 01/25/2016 0348  WBC 13.3*  < > 19.9* 19.9* 19.1* 14.7* 11.7*  NEUTROABS 12.3*  --   --   --   --  13.2*  --   HGB 11.9*  < > 10.2* 9.9* 9.7* 9.4* 10.0*  HCT 34.8*  < > 29.8* 29.4* 28.7* 27.9* 30.1*  MCV 93.0  < > 94.0 94.2 93.8 93.3 95.3  PLT 205  < > 135* 141* 109* 95* 103*  < > = values in this interval not displayed. Basic Metabolic Panel:  Recent Labs Lab 01/21/2016 1100  01/25/16 0313 01/26/16 0300 01/26/16 1500 01/27/16 0600 01/12/2016 0348  NA 143  < > 145 145 147* 147* 147*  K 4.3  < > 3.4* 3.3* 3.3* 3.4* 3.3*  CL 105  < > 113* 111 112* 110 114*  CO2 21*  < > 24 25 25 25 26   GLUCOSE 90  < > 112* 152* 148* 129* 122*  BUN 86*  < > 93* 94* 96* 97* 96*  CREATININE 3.32*  < > 3.14* 2.89* 3.01* 2.95* 2.78*  CALCIUM 8.9  < > 6.9* 8.4* 8.6* 8.5* 8.5*  MG 2.4  --   --   --  2.2  --  2.1  < > = values in this interval not displayed. GFR: Estimated Creatinine Clearance: 31.7 mL/min (by C-G formula based on SCr of 2.78 mg/dL (H)). Liver Function Tests:  Recent Labs Lab 01/25/16 0313 01/26/16 0300 01/26/16 1500 01/27/16 0600 01/04/2016 0348  AST 25 21 21 20 21   ALT 16* 20 20 18 17   ALKPHOS 34* 55 51 50 49  BILITOT 1.7* 2.6* 2.4* 2.2* 2.3*  PROT 3.4* 6.4* 6.0*  6.1* 6.2*  ALBUMIN 1.4* 2.6* 2.4* 2.5* 2.5*   No results for input(s): LIPASE, AMYLASE in the last 168 hours. No results for input(s): AMMONIA in the last 168 hours. Coagulation Profile:  Recent Labs Lab 01/25/16 2000 01/26/16 0300 01/26/16 1055 01/27/16 0600 01/27/2016 0348  INR 1.39 1.60 1.76 2.23 2.80  Cardiac Enzymes:  Recent Labs Lab 16-Feb-2016 1100 01/24/16 1312 01/25/16 0313 01/26/16 1500  CKTOTAL 528*  --  225 50  TROPONINI 0.36* 0.25*  --   --    BNP (last 3 results) No results for input(s): PROBNP in the last 8760 hours. HbA1C: No results for input(s): HGBA1C in the last 72 hours. CBG:  Recent Labs Lab 01/25/16 2326 01/26/16 0418 01/26/16 0804 01/26/16 1139 01/24/2016 1120  GLUCAP 110* 115* 130* 124* 101*   Lipid Profile: No results for input(s): CHOL, HDL, LDLCALC, TRIG, CHOLHDL, LDLDIRECT in the last 72 hours. Thyroid Function Tests:  Recent Labs  01/30/2016 0348  TSH 0.709   Anemia Panel:  Recent Labs  01/27/16 0500  TIBC 168*  IRON 108   Sepsis Labs:  Recent Labs Lab 02-16-2016 1453  02/16/2016 2114 01/24/16 0946 01/24/16 1312 01/26/16 0300  PROCALCITON 16.25  --   --   --   --   --   LATICACIDVEN 3.0*  < > 2.6* 2.2* 2.0* 1.0  < > = values in this interval not displayed.  Recent Results (from the past 240 hour(s))  Culture, blood (routine x 2)     Status: None   Collection Time: 02/16/16 12:48 PM  Result Value Ref Range Status   Specimen Description BLOOD LEFT ANTECUBITAL  Final   Special Requests BOTTLES DRAWN AEROBIC AND ANAEROBIC 5 CC EA  Final   Culture   Final    NO GROWTH 5 DAYS Performed at [facility]    Report Status 01/22/2016 FINAL  Final  Culture, blood (routine x 2)     Status: Abnormal   Collection Time: 02-16-16 12:48 PM  Result Value Ref Range Status   Specimen Description BLOOD LEFT ANTECUBITAL  Final   Special Requests BOTTLES DRAWN AEROBIC AND ANAEROBIC 5 CC EA  Final   Culture  Setup Time   Final     GRAM NEGATIVE RODS ANAEROBIC BOTTLE ONLY CRITICAL RESULT CALLED TO, READ BACK BY AND VERIFIED WITH: Haze Boyden, PHARM AT 0813 ON [id number] BY Lucienne Capers Performed at [facility]    Culture [facility] MORGANII (A)  Final   Report Status 01/26/2016 FINAL  Final   Organism ID, Bacteria MORGANELLA MORGANII  Final      Susceptibility   Morganella morganii - MIC*    AMPICILLIN >=32 RESISTANT Resistant     CEFAZOLIN >=64 RESISTANT Resistant     CEFEPIME <=1 SENSITIVE Sensitive     CEFTAZIDIME <=1 SENSITIVE Sensitive     CEFTRIAXONE <=1 SENSITIVE Sensitive     CIPROFLOXACIN <=0.25 SENSITIVE Sensitive     GENTAMICIN <=1 SENSITIVE Sensitive     IMIPENEM 4 SENSITIVE Sensitive     TRIMETH/SULFA <=20 SENSITIVE Sensitive     AMPICILLIN/SULBACTAM >=32 RESISTANT Resistant     PIP/TAZO <=4 SENSITIVE Sensitive     * MORGANELLA MORGANII  Blood Culture ID Panel (Reflexed)     Status: None   Collection Time: 02-16-16 12:48 PM  Result Value Ref Range Status   Enterococcus species NOT DETECTED NOT DETECTED Final   Listeria monocytogenes NOT DETECTED NOT DETECTED Final   Staphylococcus species NOT DETECTED NOT DETECTED Final   Staphylococcus aureus NOT DETECTED NOT DETECTED Final   Streptococcus species NOT DETECTED NOT DETECTED Final   Streptococcus agalactiae NOT DETECTED NOT DETECTED Final   Streptococcus pneumoniae NOT DETECTED NOT DETECTED Final   Streptococcus pyogenes NOT DETECTED NOT DETECTED Final   Acinetobacter baumannii NOT DETECTED NOT DETECTED Final  Enterobacteriaceae species NOT DETECTED NOT DETECTED Final   Enterobacter cloacae complex NOT DETECTED NOT DETECTED Final   Escherichia coli NOT DETECTED NOT DETECTED Final   Klebsiella oxytoca NOT DETECTED NOT DETECTED Final   Klebsiella pneumoniae NOT DETECTED NOT DETECTED Final   Proteus species NOT DETECTED NOT DETECTED Final   Serratia marcescens NOT DETECTED NOT DETECTED Final   Haemophilus influenzae NOT DETECTED NOT  DETECTED Final   Neisseria meningitidis NOT DETECTED NOT DETECTED Final   Pseudomonas aeruginosa NOT DETECTED NOT DETECTED Final   Candida albicans NOT DETECTED NOT DETECTED Final   Candida glabrata NOT DETECTED NOT DETECTED Final   Candida krusei NOT DETECTED NOT DETECTED Final   Candida parapsilosis NOT DETECTED NOT DETECTED Final   Candida tropicalis NOT DETECTED NOT DETECTED Final    Comment: Performed at [facility]  Culture, Urine     Status: None   Collection Time: February 07, 2016  5:16 PM  Result Value Ref Range Status   Specimen Description URINE, RANDOM  Final   Special Requests NONE  Final   Culture NO GROWTH Performed at [facility]   Final   Report Status 01/24/2016 FINAL  Final  MRSA PCR Screening     Status: None   Collection Time: 02-07-2016  5:21 PM  Result Value Ref Range Status   MRSA by PCR NEGATIVE NEGATIVE Final    Comment:        The GeneXpert MRSA Assay (FDA approved for NASAL specimens only), is one component of a comprehensive MRSA colonization surveillance program. It is not intended to diagnose MRSA infection nor to guide or monitor treatment for MRSA infections.      Radiology Studies: No results found.  Scheduled Meds: . atorvastatin  40 mg Oral q1800  . cefTRIAXone (ROCEPHIN)  IV  2 g Intravenous Q24H  . lidocaine  1 application Urethral Once  . phytonadione  5 mg Oral Daily  . sodium chloride flush  10 mL Intravenous Q8H  . sucralfate  1 g Oral TID WC & HS   Continuous Infusions: . pantoprozole (PROTONIX) infusion 8 mg/hr (01/24/2016 1427)     LOS: 5 days   Rubbie Goostree, Scheryl Marten, MD [facility] Pager [phone]  If 7PM-7AM, please contact night-coverage [email or web address] Password [facility] 01/14/2016, 4:47 PM

## 2016-01-28 NOTE — Progress Notes (Signed)
eLink Physician-Brief Progress Note Patient Name: Vincent HatchetJohn W Lusignan DOB: Jun 04, 1940 MRN: [id number]   Date of Service  01/09/2016  HPI/Events of Note  Protonix IV infusion order due to expire. GI note from yesterday states to continue Protonix and Octreotide IV infusions.   eICU Interventions  Will reorder Protonix IV infusion.         Sommer,Steven Dennard Nipugene 01/23/2016, 2:33 AM

## 2016-01-28 NOTE — Progress Notes (Signed)
[facility] PROGRESS NOTE Subjective Pt doing well no signs of bleeding, other problems stable  Objective: Vital signs in last 24 hours: Temp:  [96 F (35.6 C)-97.6 F (36.4 C)] 96.6 F (35.9 C) (10/25 0403) Pulse Rate:  [26-106] 100 (10/25 0600) Resp:  [0-18] 0 (10/25 0600) BP: (83-118)/(50-79) 106/79 (10/25 0600) SpO2:  [77 %-100 %] 100 % (10/25 0600) Weight:  [131 kg (288 lb 12.8 oz)] 131 kg (288 lb 12.8 oz) (10/25 0322) Last BM Date: 01/27/16  Intake/Output from previous day: 10/24 0701 - 10/25 0700 In: 1230 [I.V.:1200; NG/GT:30] Out: 575 [Urine:575] Intake/Output this shift: Total I/O In: 1230 [I.V.:1200; NG/GT:30] Out: 575 [Urine:575]    Lab Results:  Recent Labs  01/25/16 2000 01/26/16 0300 01/26/16 1500 01/27/16 0600 01/08/2016 0348  WBC 19.9* 19.9* 19.1* 14.7* 11.7*  HGB 10.2* 9.9* 9.7* 9.4* 10.0*  HCT 29.8* 29.4* 28.7* 27.9* 30.1*  PLT 135* 141* 109* 95* 103*   BMET  Recent Labs  01/26/16 0300 01/26/16 1500 01/27/16 0600 01/13/2016 0348  NA 145 147* 147* 147*  K 3.3* 3.3* 3.4* 3.3*  CL 111 112* 110 114*  CO2 25 25 25 26  CREATININE 2.89* 3.01* 2.95* 2.78*   LFT  Recent Labs  01/26/16 1500 01/27/16 0600 01/24/2016 0348  PROT 6.0* 6.1* 6.2*  AST 21 20 21  ALT 20 18 17  ALKPHOS 51 50 49  BILITOT 2.4* 2.2* 2.3*   PT/INR  Recent Labs  01/26/16 1055 01/27/16 0600 01/25/2016 0348  LABPROT 20.8* 25.1* 30.1*  INR 1.76 2.23 2.80   PANCREAS No results for input(s): LIPASE in the last 72 hours.       Studies/Results: No results found.  Medications: I have reviewed the patient's current medications.  Assessment/Plan: 1.UGI Bleed. Appears stable. Will plan on EGD at bedside later today have discussed with pt will keep NPO and check on time later.   Dejonge JR,Tylik Treese L 02/01/2016, 6:15 AM  This note was created using voice recognition software. Minor errors may Have occurred unintentionally.  Pager: [phone] If no  answer or after hours call [phone]   

## 2016-01-28 NOTE — Progress Notes (Signed)
SLP Cancellation Note  Patient Details Name: Vincent HatchetJohn W Sebree MRN: [id number] DOB: [birth year]   Cancelled treatment:       Reason Eval/Treat Not Completed: Medical issues which prohibited therapy  Pt at endoscopy at this time.    Donavan Burnetamara Ricki Vanhandel, MS [facility]CCC SLP [phone]

## 2016-01-28 NOTE — Progress Notes (Signed)
Patient Name: Vincent HatchetJohn W Gibson Date of Encounter: 01/29/2016  [facility]  [facility] Problem List     Principal Problem:   Sepsis [facility](HCC) Active Problems:   AKI (acute kidney injury) (HCC)   Urinary retention   Prolonged Q-T interval on ECG   Atrial fibrillation (HCC)   Chronic combined systolic and diastolic CHF (congestive heart failure) (HCC)   Cellulitis and abscess of right lower extremity   Pressure injury of skin   Rhabdomyolysis   GI bleeding   Coagulopathy (HCC)   Lactic acidosis   Elevated troponin   Shock (HCC)   Hypoalbuminemia   RBBB   Bacteremia   Acute respiratory failure (HCC)    Subjective   No complaints this AM, resting comfortably. No CP, SOB. Diffuse anasarca again noted. He reports h/o dependent edema for which he took furosemide at home.  Inpatient Medications    . atorvastatin  40 mg Oral q1800  . cefTRIAXone (ROCEPHIN)  IV  2 g Intravenous Q24H  . lidocaine  1 application Urethral Once  . phytonadione  5 mg Oral Daily  . sodium chloride flush  10 mL Intravenous Q8H    Vital Signs    Vitals:   01/12/2016 0403 01/14/2016 0500 01/22/2016 0600 01/11/2016 0700  BP:  108/61 106/79 (!) 100/52  Pulse:  98 100 100  Resp:  (!) 6  (!) 6  Temp: (!) 96.6 F (35.9 C)     TempSrc: Axillary     SpO2:  93% 100% 98%  Weight:      Height:        Intake/Output Summary (Last 24 hours) at 01/14/2016 0739 Last data filed at 01/27/2016 0600  Gross per 24 hour  Intake             1230 ml  Output             1225 ml  Net                5 ml   Filed Weights   01/26/16 0423 01/27/16 0306 01/31/2016 0322  Weight: 285 lb 7.9 oz (129.5 kg) 288 lb 12.8 oz (131 kg) 288 lb 12.8 oz (131 kg)    Physical Exam    General: Well developed, well nourished AAM, in no acute distress HEENT: Normocephalic, atraumatic, sclera non-icteric, no xanthomas, nares are without discharge. Periorbital edema noted. Neck: JVP not elevated. Lungs: Clear bilaterally  to auscultation without wheezes, rales, or rhonchi. Breathing is unlabored. Cardiac: Irregularly irregular, rate controlled, S1 S2 without murmurs, rubs, or gallops.  Abdomen: Soft, protuberant but nontender.  Extremities: No clubbing or cyanosis. Diffuse anasarca with bilateral soft puffy UE edema and 2-3+ LE edema noted, also with chronic venous stasis changes. Skin: Warm and dry Neuro: Alert and oriented X 3. Follows commands. Sensation in tact. Psych:  Responds to questions appropriately with a normal affect  Labs    CBC  Recent Labs  01/27/16 0600 01/08/2016 0348  WBC 14.7* 11.7*  NEUTROABS 13.2*  --   HGB 9.4* 10.0*  HCT 27.9* 30.1*  MCV 93.3 95.3  PLT 95* 103*   Basic Metabolic Panel  Recent Labs  01/26/16 1500 01/27/16 0600 01/12/2016 0348  NA 147* 147* 147*  K 3.3* 3.4* 3.3*  CL 112* 110 114*  CO2 25 25 26   GLUCOSE 148* 129* 122*  BUN 96* 97* 96*  CREATININE 3.01* 2.95* 2.78*  CALCIUM 8.6* 8.5* 8.5*  MG 2.2  --  2.1  Liver Function Tests  Recent Labs  01/27/16 0600 01/04/2016 0348  AST 20 21  ALT 18 17  ALKPHOS 50 49  BILITOT 2.2* 2.3*  PROT 6.1* 6.2*  ALBUMIN 2.5* 2.5*   No results for input(s): LIPASE, AMYLASE in the last 72 hours. Cardiac Enzymes  Recent Labs  01/26/16 1500  CKTOTAL 50   Thyroid Function Tests  Recent Labs  01/20/2016 0348  TSH 0.709    Telemetry    Atrial fib rates 90-110s  Radiology    Ct Abdomen Pelvis Wo Contrast  Result Date: 01/24/2016 CLINICAL DATA:  75 year old male inpatient with a history of atrial fibrillation on Coumadin, CHF, admitted after being found down for 3 days in the bathtub, treated for sepsis and acute renal failure, now with acute respiratory failure, ileus and abdominal distention. Left lung opacities on chest radiograph. EXAM: CT CHEST, ABDOMEN AND PELVIS WITHOUT CONTRAST TECHNIQUE: Multidetector CT imaging of the chest, abdomen and pelvis was performed following the standard protocol without  IV contrast. COMPARISON:  01/24/2016 abdominal radiographs and Feb 03, 2016 chest radiographs. 07/02/2014 CT abdomen/pelvis. FINDINGS: Examination is significantly limited by patient body habitus, streak artifact from the patient's upper extremities, motion artifact and lack of IV contrast. CT CHEST FINDINGS Cardiovascular: Mild cardiomegaly. No significant pericardial fluid/thickening. Mildly atherosclerotic nonaneurysmal thoracic aorta. Dilated main pulmonary artery (3.7 cm diameter). Mediastinum/Nodes: No discrete thyroid nodules. Unremarkable esophagus. No pathologically enlarged axillary, mediastinal or gross hilar lymph nodes, noting limited sensitivity for the detection of hilar adenopathy on this noncontrast study. Lungs/Pleura: No pneumothorax. Small layering right pleural effusion. No left pleural effusion. There is layering debris in the tracheal lumen at the level of the thoracic inlet. There is a mosaic attenuation throughout both lungs. There are patchy bandlike areas of consolidation with associated volume loss in the bilateral lower lobes, favor atelectasis. Thin parenchymal bands in the right middle lobe and lingula are most consistent with areas of postinfectious/ postinflammatory scarring. No lung masses or significant pulmonary nodules in the aerated portions of the lungs. Musculoskeletal: No aggressive appearing focal osseous lesions. Mild-to-moderate thoracic spondylosis. Anasarca. CT ABDOMEN PELVIS FINDINGS Hepatobiliary: There is relative hypertrophy of the left liver lobe and the liver surface is diffusely irregular, consistent with cirrhosis. No gross liver mass. Normal gallbladder with no radiopaque cholelithiasis. No biliary ductal dilatation. Pancreas: Normal, with no mass or duct dilation. Spleen: Normal size. No mass. Adrenals/Urinary Tract: No discrete adrenal nodules. No hydronephrosis. No renal stones. Simple 2.3 cm posterior upper right renal cyst. Simple 3.1 cm lateral interpolar  left renal cyst. No additional contour deforming renal masses. Bladder is nearly collapsed by indwelling Foley catheter, with the suggestion of diffuse bladder wall thickening. Stomach/Bowel: Grossly normal stomach. Normal caliber small bowel with no small bowel wall thickening. Faintly visualized normal appendix. There is mild dilatation throughout the large bowel with mild stool and scattered fluid levels in the large bowel. No large bowel wall thickening. Vascular/Lymphatic: Mildly atherosclerotic nonaneurysmal abdominal aorta. No gross pathologically enlarged lymph nodes in the abdomen or pelvis. Reproductive: Markedly enlarged prostate, not appreciably changed. Other: No pneumoperitoneum.  Large volume simple ascites. Musculoskeletal: No aggressive appearing focal osseous lesions. Moderate lumbar spondylosis. Anasarca. IMPRESSION: 1. Cirrhosis. No gross liver mass on this limited noncontrast CT study. Large volume ascites. Normal size spleen. 2. Mild diffuse large bowel dilatation with colonic fluid levels, consistent with mild colonic ileus. 3. Anasarca. 4. Cardiomegaly.  Small layering right pleural effusion. 5. Prominently dilated main pulmonary artery, likely indicating pulmonary arterial hypertension. Mosaic  attenuation throughout the lungs could be due to mosaic perfusion from pulmonary vascular disease versus air trapping from small airways disease. 6. Patchy bandlike areas of consolidation with associated volume loss in the lower lungs, favor atelectasis. 7. Markedly enlarged prostate. 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 Bladder nearly collapsed by indwelling Foley catheter. No hydronephrosis. 8. Aortic atherosclerosis. Electronically Signed   By: Delbert Phenix M.D.   On: 01/24/2016 18:30   Ct Chest Wo Contrast  Result Date: 01/24/2016 CLINICAL DATA:  75 year old male inpatient with a history of atrial fibrillation on Coumadin, CHF,  admitted after being found down for 3 days in the bathtub, treated for sepsis and acute renal failure, now with acute respiratory failure, ileus and abdominal distention. Left lung opacities on chest radiograph. EXAM: CT CHEST, ABDOMEN AND PELVIS WITHOUT CONTRAST TECHNIQUE: Multidetector CT imaging of the chest, abdomen and pelvis was performed following the standard protocol without IV contrast. COMPARISON:  01/24/2016 abdominal radiographs and Feb 18, 2016 chest radiographs. 07/02/2014 CT abdomen/pelvis. FINDINGS: Examination is significantly limited by patient body habitus, streak artifact from the patient's upper extremities, motion artifact and lack of IV contrast. CT CHEST FINDINGS Cardiovascular: Mild cardiomegaly. No significant pericardial fluid/thickening. Mildly atherosclerotic nonaneurysmal thoracic aorta. Dilated main pulmonary artery (3.7 cm diameter). Mediastinum/Nodes: No discrete thyroid nodules. Unremarkable esophagus. No pathologically enlarged axillary, mediastinal or gross hilar lymph nodes, noting limited sensitivity for the detection of hilar adenopathy on this noncontrast study. Lungs/Pleura: No pneumothorax. Small layering right pleural effusion. No left pleural effusion. There is layering debris in the tracheal lumen at the level of the thoracic inlet. There is a mosaic attenuation throughout both lungs. There are patchy bandlike areas of consolidation with associated volume loss in the bilateral lower lobes, favor atelectasis. Thin parenchymal bands in the right middle lobe and lingula are most consistent with areas of postinfectious/ postinflammatory scarring. No lung masses or significant pulmonary nodules in the aerated portions of the lungs. Musculoskeletal: No aggressive appearing focal osseous lesions. Mild-to-moderate thoracic spondylosis. Anasarca. CT ABDOMEN PELVIS FINDINGS Hepatobiliary: There is relative hypertrophy of the left liver lobe and the liver surface is diffusely  irregular, consistent with cirrhosis. No gross liver mass. Normal gallbladder with no radiopaque cholelithiasis. No biliary ductal dilatation. Pancreas: Normal, with no mass or duct dilation. Spleen: Normal size. No mass. Adrenals/Urinary Tract: No discrete adrenal nodules. No hydronephrosis. No renal stones. Simple 2.3 cm posterior upper right renal cyst. Simple 3.1 cm lateral interpolar left renal cyst. No additional contour deforming renal masses. Bladder is nearly collapsed by indwelling Foley catheter, with the suggestion of diffuse bladder wall thickening. Stomach/Bowel: Grossly normal stomach. Normal caliber small bowel with no small bowel wall thickening. Faintly visualized normal appendix. There is mild dilatation throughout the large bowel with mild stool and scattered fluid levels in the large bowel. No large bowel wall thickening. Vascular/Lymphatic: Mildly atherosclerotic nonaneurysmal abdominal aorta. No gross pathologically enlarged lymph nodes in the abdomen or pelvis. Reproductive: Markedly enlarged prostate, not appreciably changed. Other: No pneumoperitoneum.  Large volume simple ascites. Musculoskeletal: No aggressive appearing focal osseous lesions. Moderate lumbar spondylosis. Anasarca. IMPRESSION: 1. Cirrhosis. No gross liver mass on this limited noncontrast CT study. Large volume ascites. Normal size spleen. 2. Mild diffuse large bowel dilatation with colonic fluid levels, consistent with mild colonic ileus. 3. Anasarca. 4. Cardiomegaly.  Small layering right pleural effusion. 5. Prominently dilated main pulmonary artery, likely indicating pulmonary arterial hypertension. Mosaic attenuation throughout the lungs could be due to  mosaic perfusion from pulmonary vascular disease versus air trapping from small airways disease. 6. Patchy bandlike areas of consolidation with associated volume loss in the lower lungs, favor atelectasis. 7. Markedly enlarged prostate. Chronic mild diffuse bladder wall  thickening, probably due to chronic bladder outlet obstruction. Bladder nearly collapsed by indwelling Foley catheter. No hydronephrosis. 8. Aortic atherosclerosis. Electronically Signed   By: Delbert Phenix M.D.   On: 01/24/2016 18:30   Dg Chest Port 1 View  Result Date: 01/25/2016 CLINICAL DATA:  PICC line placement.  Nasogastric tube placement. EXAM: PORTABLE CHEST 1 VIEW COMPARISON:  01/25/2016 FINDINGS: Nasogastric tube tip is in the stomach body. Right-sided PICC line tip projects over the SVC. Leftward shift of cardiac and mediastinal structures. Bilateral interstitial accentuation. Enlarged main pulmonary artery. Left costophrenic angle excluded. IMPRESSION: 1. Nasogastric tube tip in the stomach body, PICC line tip in the SVC. 2. Potential mild cardiomegaly, with some leftward shift of the heart possibly due to left-sided atelectasis. 3. Prominent main pulmonary artery, potentially with some left perihilar atelectasis. 4. Interstitial accentuation could be from atypical pneumonia or mild interstitial edema. Electronically Signed   By: Gaylyn Rong M.D.   On: 01/25/2016 18:38   Dg Chest Port 1 View  Result Date: 01/25/2016 CLINICAL DATA:  Respiratory distress,  hypotension EXAM: PORTABLE CHEST 1 VIEW COMPARISON:  10/20/ 17 FINDINGS: Cardiomegaly is noted. There is NG tube coiled within stomach with tip in proximal stomach. Elevation of the right hemidiaphragm again noted. Persistent streaky left base retrocardiac atelectasis or infiltrate. IMPRESSION: There is NG tube coiled within stomach with tip in proximal stomach. Elevation of the right hemidiaphragm again noted. Persistent streaky left base retrocardiac atelectasis or infiltrate. Electronically Signed   By: Natasha Mead M.D.   On: 01/25/2016 10:23   Dg Abd Acute W/chest  Result Date: 01/16/2016 CLINICAL DATA:  75 year old male with abdominal distention. Initial encounter. EXAM: DG ABDOMEN ACUTE W/ 1V CHEST COMPARISON:  CT Abdomen and  Pelvis 06/24/2014 and earlier. FINDINGS: Chronic cardiomegaly appears not significantly changed since 2011. However, there is abnormal increased left hilar and retrocardiac density. No pneumothorax or pleural effusion. Crowding of lung markings elsewhere. No edema suspected. Large body habitus. No pneumoperitoneum. Supine and left-side-down lateral decubitus views of the abdomen are provided. Mild to moderate gaseous distension of the stomach and throughout the large bowel. Several gas-filled but nondilated small bowel loops are noted. Paucity of gas in the rectum. Furthermore there is a hazy gray opacity throughout the abdomen. This can be seen with ascites. No acute or suspicious osseous lesion identified. IMPRESSION: 1. Abnormal increased left hilar and retrocardiac density in the chest. PA and lateral chest radiographs would be helpful when possible. Failing that, Chest CT (IV contrast preferred) would be recommended. 2. Wallace Cullens hazy opacity throughout the abdomen suggesting Ascites. 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 3. Gas distended stomach and large bowel. Paucity of gas in the distal sigmoid colon and rectum. Small bowel loops nondilated at this time. Differential considerations include ileus and distal colon obstruction. Note that the prostate was severely enlarged on the 2016 CT Abdomen and Pelvis. Electronically Signed   By: Odessa Fleming M.D.   On: 01/12/2016 12:03   Dg Abd Portable 1v  Result Date: 01/25/2016 CLINICAL DATA:  Check nasogastric catheter placement EXAM: PORTABLE ABDOMEN - 1 VIEW COMPARISON:  01/24/2016 FINDINGS: Scattered large and small bowel gas is noted. The degree of gaseous distension of the colon is stable.  A nasogastric catheter is now seen within the stomach. No free air is seen. IMPRESSION: New nasogastric catheter within the stomach.  Stable colonic ileus. Electronically Signed   By: Alcide Clever M.D.   On: 01/25/2016 10:23   Dg Abd  Portable 1v  Result Date: 01/24/2016 CLINICAL DATA:  Ileus EXAM: PORTABLE ABDOMEN - 1 VIEW COMPARISON:  01/14/2016 FINDINGS: Scattered large and small bowel gas is noted. Gas distension of the colon is again identified and stable from the prior exam. The overall appearance is similar to that seen on the previous day. No free air is noted. Foley catheter is noted. IMPRESSION: Changes consistent with a colonic ileus. Correlation with physical exam is recommended. No significant change from the previous day is seen. Electronically Signed   By: Alcide Clever M.D.   On: 01/24/2016 07:16     Patient Profile     24M Jehovah's witness (?possibly followed by Dr. Royann Shivers in the past -no notes available) with history of refusal of blood products, atrial fib on Coumadin (INR followed by PCP), chronic combined CHF EF 45-50%, mild right heart failure, mod pulm HTN, HTN, dyslipidemia presented to [facility] with a fall in his bathtub, unable to get up for 3 days. Found to have acute renal failure, mild rhabdomyolysis, troponin of 0.36, septic shock with lactic acidosis, lower extremity cellulitis, Morganella bacteremia, coagulopathy with INR >10, prolonged QTC. Developed coffee ground emesis 01/25/16 with acute GIB, progressive anemia/thrombocytopenia and hypernatremia. 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  Assessment & Plan    1. Elevated troponin - likely demand ischemia in multiple ongoing medical issues. Can revisit issue of ischemic testing on an OP basis given many issues that need to resolve before this makes sense. Not on ASA given GIB and continued coagulopathy.  2. Multifactorial shock with GIB (question variceal) - in setting of intravascular volume depletion, sepsis and GI bleeding, being managed by IM.   3. Acute on chronic combined CHF - f/u echo pending. Some of third spacing is likely due to hypoalbuminemia and finding of  cirrhosis on imaging. IVF have been stopped. High sodium indicates intravascular depletion. Will review with MD about going forward with cautious IV diuresis since he remains borderline hypotensive. He is now off pressors.  4. 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 IM treating with vitamin K given recurrent rise in INR off Coumadin. Not likely to be a candidate for resumption anytime soon given presumed variceal bleed and patient's refusal of PRN blood products. Plan continued rate control for now given his interruption in anticoagulation. Will review plan for Lasix before adding anything for rate control at this time.  5. AKI with hyopkalemia/hypernatremia - per IM.  6. Cirrhosis by CT - could be contributing to thrombocytopenia, persistent coagulopathy off Coumadin, and anasarca.  7. Hypothermia - per IM.  Signed, Laurann Montana, PA-C  01/25/2016, 7:39 AM

## 2016-01-28 NOTE — Transfer of Care (Signed)
Immediate Anesthesia Transfer of Care Note  Patient: Vincent HatchetJohn W Gibson  Procedure(s) Performed: Procedure(s): ESOPHAGOGASTRODUODENOSCOPY (EGD) (N/A)  Patient Location: PACU  Anesthesia Type:MAC  Level of Consciousness: awake, alert  and oriented  Airway & Oxygen Therapy: Patient Spontanous Breathing and Patient connected to nasal cannula oxygen  Post-op Assessment: Report given to RN and Post -op Vital signs reviewed and stable  Post vital signs: Reviewed and stable  Last Vitals:  Vitals:   01/11/2016 1130 01/14/2016 1159  BP:  (!) 100/47  Pulse:  93  Resp:  11  Temp: 36.4 C     Last Pain:  Vitals:   01/11/2016 1130  TempSrc: Oral  PainSc:          Complications: No apparent anesthesia complications

## 2016-01-28 NOTE — Anesthesia Postprocedure Evaluation (Signed)
Anesthesia Post Note  Patient: Vincent HatchetJohn W Gibson  Procedure(s) Performed: Procedure(s) (LRB): ESOPHAGOGASTRODUODENOSCOPY (EGD) (N/A)  Patient location during evaluation: PACU Anesthesia Type: MAC Level of consciousness: awake and alert Pain management: pain level controlled Vital Signs Assessment: post-procedure vital signs reviewed and stable Respiratory status: spontaneous breathing, nonlabored ventilation, respiratory function stable and patient connected to nasal cannula oxygen Cardiovascular status: stable and blood pressure returned to baseline Anesthetic complications: no Comments: Blood pressure back at baseline.    Last Vitals:  Vitals:   02/03/2016 1300 01/11/2016 1310  BP: 120/60 116/70  Pulse: 91 (!) 41  Resp: 14 (!) 8  Temp:      Last Pain:  Vitals:   01/18/2016 1252  TempSrc: Oral  PainSc:                  Audianna Landgren J

## 2016-01-28 NOTE — Op Note (Signed)
[facility] Patient Name: Vincent Gibson Procedure Date: 01/13/2016 MRN: [id number] Attending MD: Tresea Mall Dr., MD Date of Birth: [birth year] CSN: [id number] Age: 75 Admit Type: Inpatient Procedure:                Upper GI endoscopy Indications:              Coffee-ground emesis, Hematemesis. Patient has                            abnormal liver CT. There was question of cirrhosis                            so he has been on octreotide. He has been on IV                            Protonix infusion. He has a Scientist, product/process development. Providers:                Llana Aliment. Mosco Dr., MD, Anthony Sar, RN, Lorenda Ishihara, Technician Referring MD:              Medicines:                Monitored Anesthesia Care Complications:            No immediate complications. Estimated Blood Loss:     Estimated blood loss: none. Procedure:                Pre-Anesthesia Assessment:                           - Prior to the procedure, a History and Physical                            was performed, and patient medications and                            allergies were reviewed. The patient's tolerance of                            previous anesthesia was also reviewed. The risks                            and benefits of the procedure and the sedation                            options and risks were discussed with the patient.                            All questions were answered, and informed consent                            was obtained. Prior Anticoagulants: The patient has  taken Coumadin (warfarin), last dose was 7 days                            prior to procedure. ASA Grade Assessment: IV - A                            patient with severe systemic disease that is a                            constant threat to life. After reviewing the risks                            and benefits, the patient was deemed in      satisfactory condition to undergo the procedure.                           After obtaining informed consent, the endoscope was                            passed under direct vision. Throughout the                            procedure, the patient's blood pressure, pulse, and                            oxygen saturations were monitored continuously. The                            [id number]) scope was introduced through the                            mouth, and advanced to the second part of duodenum.                            The upper GI endoscopy was accomplished without                            difficulty. The patient tolerated the procedure                            fairly well. Scope In: Scope Out: Findings:      A large hiatal hernia was present.      LA Grade D (one or more mucosal breaks involving at least 75% of       esophageal circumference) esophagitis with no bleeding was found in the       lower third of the esophagus.      There is no endoscopic evidence of Barrett's esophagus, stricture or       varices in the entire esophagus.      A few localized, non-bleeding erosions were found in the gastric antrum.       There were no stigmata of recent bleeding.      Two non-bleeding linear duodenal ulcers were found in the duodenal bulb. Impression:               -  Large hiatal hernia.                           - LA Grade D reflux esophagitis. minimal amount of                            coffee ground material in the stomach with no                            active bleeding.                           - Non-bleeding erosive gastropathy.                           - Multiple non-bleeding duodenal ulcers.                           - No specimens collected. Moderate Sedation:      MAC by anesthesia Recommendation:           - Continue present medications.                           - Clear liquid diet.                           - Use sucralfate suspension 1 gram PO  QID.                           - will DC NG tube                           - Return patient to hospital ward for ongoing care. Procedure Code(s):        --- Professional ---                           [phone], Esophagogastroduodenoscopy, flexible,                            transoral; diagnostic, including collection of                            specimen(s) by brushing or washing, when performed                            (separate procedure) Diagnosis Code(s):        --- Professional ---                           K21.0, Gastro-esophageal reflux disease with                            esophagitis                           K44.9, Diaphragmatic hernia without obstruction or  gangrene                           K31.89, Other diseases of stomach and duodenum                           K92.0, Hematemesis                           K26.9, Duodenal ulcer, unspecified as acute or                            chronic, without hemorrhage or perforation CPT copyright 2016 [employer]. All rights reserved. The codes documented in this report are preliminary and upon coder review may  be revised to meet current compliance requirements. Tresea Mall Dr., MD 01/22/2016 1:03:15 PM This report has been signed electronically. Number of Addenda: 0

## 2016-01-28 NOTE — H&P (View-Only) (Signed)
[facility] PROGRESS NOTE Subjective Pt doing well no signs of bleeding, other problems stable  Objective: Vital signs in last 24 hours: Temp:  [96 F (35.6 C)-97.6 F (36.4 C)] 96.6 F (35.9 C) (10/25 0403) Pulse Rate:  [26-106] 100 (10/25 0600) Resp:  [0-18] 0 (10/25 0600) BP: (83-118)/(50-79) 106/79 (10/25 0600) SpO2:  [77 %-100 %] 100 % (10/25 0600) Weight:  [131 kg (288 lb 12.8 oz)] 131 kg (288 lb 12.8 oz) (10/25 0322) Last BM Date: 01/27/16  Intake/Output from previous day: 10/24 0701 - 10/25 0700 In: 1230 [I.V.:1200; NG/GT:30] Out: 575 [Urine:575] Intake/Output this shift: Total I/O In: 1230 [I.V.:1200; NG/GT:30] Out: 575 [Urine:575]    Lab Results:  Recent Labs  01/25/16 2000 01/26/16 0300 01/26/16 1500 01/27/16 0600 01/31/2016 0348  WBC 19.9* 19.9* 19.1* 14.7* 11.7*  HGB 10.2* 9.9* 9.7* 9.4* 10.0*  HCT 29.8* 29.4* 28.7* 27.9* 30.1*  PLT 135* 141* 109* 95* 103*   BMET  Recent Labs  01/26/16 0300 01/26/16 1500 01/27/16 0600 01/15/2016 0348  NA 145 147* 147* 147*  K 3.3* 3.3* 3.4* 3.3*  CL 111 112* 110 114*  CO2 25 25 25 26   CREATININE 2.89* 3.01* 2.95* 2.78*   LFT  Recent Labs  01/26/16 1500 01/27/16 0600 01/17/2016 0348  PROT 6.0* 6.1* 6.2*  AST 21 20 21   ALT 20 18 17   ALKPHOS 51 50 49  BILITOT 2.4* 2.2* 2.3*   PT/INR  Recent Labs  01/26/16 1055 01/27/16 0600 01/30/2016 0348  LABPROT 20.8* 25.1* 30.1*  INR 1.76 2.23 2.80   PANCREAS No results for input(s): LIPASE in the last 72 hours.       Studies/Results: No results found.  Medications: I have reviewed the patient's current medications.  Assessment/Plan: 1.UGI Bleed. Appears stable. Will plan on EGD at bedside later today have discussed with pt will keep NPO and check on time later.   Lessley JR,Regene Mccarthy L 01/05/2016, 6:15 AM  This note was created using voice recognition software. Minor errors may Have occurred unintentionally.  Pager: [phone] If no  answer or after hours call [phone]

## 2016-01-28 NOTE — Evaluation (Signed)
Occupational Therapy Evaluation Patient Details Name: Vincent Gibson MRN: [id number] DOB: 11/21/1940 Today's Date: 01/22/2016    History of Present Illness Pt is a 75 y.o. male with medical history significant of hypertension, hyperlipidemia, atrial fibrillation, systolic heart failure, EF of 08-65%, moderate pulmonary hypertension and mild right heart failure admitted for acute kidney injury after remaining in his bathtub for 3-4 days due to inability to get out.   Clinical Impression   Pt admitted with the above diagnosis and has the deficits listed below. Pt would benefit from cont OT to increase independence with basic adls and adl transfers so he can eventually d/c back home alone.  Feel pt may not be safe returning to a second story apartment at any time given his current state.  Feel pt needs main floor apartment and 24 hour assist at this time or may want to consider assisted living options if able.  At this time, rec SNF as it will be impossible for this pt to return to a second floor apartment living alone.      Follow Up Recommendations  SNF;Supervision/Assistance - 24 hour    Equipment Recommendations  Other (comment) (TBD at next venue)    Recommendations for Other Services       Precautions / Restrictions Precautions Precautions: Fall Restrictions Weight Bearing Restrictions: No Other Position/Activity Restrictions: Pt limited by edema in all extremities      Mobility Bed Mobility Overal bed mobility: Needs Assistance;+2 for physical assistance Bed Mobility: Supine to Sit;Sit to Supine     Supine to sit: Mod assist;+2 for physical assistance Sit to supine: Total assist;+2 for physical assistance   General bed mobility comments: Pt very resistant to any mobility today. Pt sat EOB and back down.  BP 91/49 in sitting so pt returned to supine.  Transfers                 General transfer comment: Pt declined OOB today stating the chair was not  comfortable yesterday. [place] obtained for the chair and pt continued to decline.    Balance Overall balance assessment: Needs assistance Sitting-balance support: Bilateral upper extremity supported;Feet supported Sitting balance-Leahy Scale: Good Sitting balance - Comments: able to maintain sitting balance with no UE support for 2-3 min; required verbal cues only to focus on task and continue participating in active sitting balance                                    ADL Overall ADL's : Needs assistance/impaired Eating/Feeding: Set up;Sitting;NPO (pt currently NPO for test but should be set up only)   Grooming: Oral care;Wash/dry face;Wash/dry hands;Set up;Sitting;Bed level   Upper Body Bathing: Set up;Sitting;Bed level   Lower Body Bathing: Maximal assistance;Bed level Lower Body Bathing Details (indicate cue type and reason): Pt declined getting out of bed this am. Upper Body Dressing : Moderate assistance;Sitting;Bed level   Lower Body Dressing: Maximal assistance;Sitting/lateral leans Lower Body Dressing Details (indicate cue type and reason): Pt sat EOB only but has great difficulty dressing LEs due to edema and decreased strength.     Toileting- Clothing Manipulation and Hygiene: Total assistance;Bed level       Functional mobility during ADLs: Moderate assistance (bed level mobility only) General ADL Comments: Pt declined transferring to the chair. Stated he hates the chair b/c it is uncomfortable.  Obtained a cusion for the chair and pt stated he would  get in chair after his EGD but did not want to get out of bed at this time..     Vision Vision Assessment?: No apparent visual deficits Additional Comments: Pt with a great amount of swelling around his eyes. Pt states he had this at home as well.  Therefore he keeps his eyes closed during a lot of the session. When pt does open his eyes, his vision appears ok but he cannot keep eyes open long enough to do any  reading.   Perception     Praxis      Pertinent Vitals/Pain Pain Assessment: Faces Faces Pain Scale: Hurts a little bit Pain Location: R arm with ROM Pain Descriptors / Indicators: Aching Pain Intervention(s): Limited activity within patient's tolerance;Monitored during session;Repositioned     Hand Dominance Right   Extremity/Trunk Assessment Upper Extremity Assessment Upper Extremity Assessment: Generalized weakness (significant edema R greater than L)   Lower Extremity Assessment Lower Extremity Assessment: Defer to PT evaluation   Cervical / Trunk Assessment Cervical / Trunk Assessment: Normal   Communication Communication Communication: No difficulties   Cognition Arousal/Alertness: Awake/alert Behavior During Therapy: WFL for tasks assessed/performed (very short with the therapist and wants things done his way) Overall Cognitive Status: Within Functional Limits for tasks assessed                     General Comments       Exercises       Shoulder Instructions      Home Living Family/patient expects to be discharged to:: Private residence Living Arrangements: Alone Available Help at Discharge: Friend(s);Available PRN/intermittently Type of Home: Apartment Home Access: Stairs to enter Entrance Stairs-Number of Steps: 12   Home Layout: One level     Bathroom Shower/Tub: Tub/shower unit;Curtain Shower/tub characteristics: Engineer, building services: Standard     Home Equipment: Other (comment) (rollator)   Additional Comments: pt lives alone, relies on public transportation in community, reports that friends are available to help PRn      Prior Functioning/Environment Level of Independence: Independent with assistive device(s)        Comments: Pt uses rollator in community at all times.          OT Problem List: Decreased strength;Decreased activity tolerance;Impaired balance (sitting and/or standing);Decreased knowledge of use of DME or  AE;Obesity;Impaired UE functional use;Increased edema;Pain   OT Treatment/Interventions: Self-care/ADL training;Energy conservation;DME and/or AE instruction;Therapeutic activities;Balance training    OT Goals(Current goals can be found in the care plan section) Acute Rehab OT Goals Patient Stated Goal: to be able to function independently in the community OT Goal Formulation: With patient Time For Goal Achievement: 02/11/16 Potential to Achieve Goals: Fair ADL Goals Pt Will Perform Lower Body Bathing: with supervision;sit to/from stand;with adaptive equipment Pt Will Perform Lower Body Dressing: with supervision;with adaptive equipment;sit to/from stand Pt Will Transfer to Toilet: with supervision;bedside commode;ambulating Pt Will Perform Toileting - Clothing Manipulation and hygiene: sit to/from stand;with min guard assist Pt Will Perform Tub/Shower Transfer: Tub transfer;tub bench;rolling walker Additional ADL Goal #1: Pt will state 2 things he can do at home to conserve energy during adls with no VCs.  OT Frequency: Min 2X/week   Barriers to D/C: Decreased caregiver support  Pt lives in second story apartment and does not have 24 hour assist.       Co-evaluation              End of Session Equipment Utilized During Treatment: Oxygen Nurse Communication: Mobility  status  Activity Tolerance: Patient limited by fatigue Patient left: in bed;with call bell/phone within reach   Time: [id number] OT Time Calculation (min): 27 min Charges:  OT General Charges $OT Visit: 1 Procedure OT Evaluation $OT Eval Moderate Complexity: 1 Procedure OT Treatments $Self Care/Home Management : 8-22 mins G-Codes:    Hope BuddsJones, Cotey Rakes Anne 01/13/2016, 9:26 AM  [phone]

## 2016-01-28 NOTE — Interval H&P Note (Signed)
History and Physical Interval Note:  Aug 22, 2015 12:17 PM  Vincent Gibson  has presented today for surgery, with the diagnosis of UGI bleed  The various methods of treatment have been discussed with the patient and family. After consideration of risks, benefits and other options for treatment, the patient has consented to  Procedure(s): ESOPHAGOGASTRODUODENOSCOPY (EGD) (N/A) as a surgical intervention .  The patient's history has been reviewed, patient examined, no change in status, stable for surgery.  I have reviewed the patient's chart and labs.  Questions were answered to the patient's satisfaction.     Mondor JR,Cleon Signorelli L

## 2016-01-29 ENCOUNTER — Encounter (HOSPITAL_COMMUNITY): Payer: Self-pay | Admitting: Gastroenterology

## 2016-01-29 ENCOUNTER — Inpatient Hospital Stay (HOSPITAL_COMMUNITY): Payer: Medicare Other

## 2016-01-29 DIAGNOSIS — I50810 Right heart failure, unspecified: Secondary | ICD-10-CM

## 2016-01-29 DIAGNOSIS — J9600 Acute respiratory failure, unspecified whether with hypoxia or hypercapnia: Secondary | ICD-10-CM

## 2016-01-29 LAB — COMPREHENSIVE METABOLIC PANEL
ALBUMIN: 2.4 g/dL — AB (ref 3.5–5.0)
ALK PHOS: 49 U/L (ref 38–126)
ALT: 17 U/L (ref 17–63)
ANION GAP: 8 (ref 5–15)
AST: 22 U/L (ref 15–41)
BUN: 92 mg/dL — ABNORMAL HIGH (ref 6–20)
CALCIUM: 8.6 mg/dL — AB (ref 8.9–10.3)
CHLORIDE: 117 mmol/L — AB (ref 101–111)
CO2: 25 mmol/L (ref 22–32)
Creatinine, Ser: 2.49 mg/dL — ABNORMAL HIGH (ref 0.61–1.24)
GFR calc Af Amer: 28 mL/min — ABNORMAL LOW (ref >60)
GFR calc non Af Amer: 24 mL/min — ABNORMAL LOW (ref >60)
GLUCOSE: 116 mg/dL — AB (ref 65–99)
Potassium: 3.4 mmol/L — ABNORMAL LOW (ref 3.5–5.1)
SODIUM: 150 mmol/L — AB (ref 135–145)
Total Bilirubin: 2.7 mg/dL — ABNORMAL HIGH (ref 0.3–1.2)
Total Protein: 6.2 g/dL — ABNORMAL LOW (ref 6.5–8.1)

## 2016-01-29 LAB — CARBOXYHEMOGLOBIN - COOX: Carboxyhemoglobin: 1.1 % (ref 0.5–1.5)

## 2016-01-29 LAB — PROTIME-INR
INR: 3.7
PROTHROMBIN TIME: 37.6 s — AB (ref 11.4–15.2)

## 2016-01-29 LAB — CBC
HCT: 29.5 % — ABNORMAL LOW (ref 39.0–52.0)
HEMOGLOBIN: 9.9 g/dL — AB (ref 13.0–17.0)
MCH: 32 pg (ref 26.0–34.0)
MCHC: 33.6 g/dL (ref 30.0–36.0)
MCV: 95.5 fL (ref 78.0–100.0)
Platelets: 110 10*3/uL — ABNORMAL LOW (ref 150–400)
RBC: 3.09 MIL/uL — AB (ref 4.22–5.81)
RDW: 17.2 % — ABNORMAL HIGH (ref 11.5–15.5)
WBC: 9.6 10*3/uL (ref 4.0–10.5)

## 2016-01-29 LAB — COOXEMETRY PANEL
CARBOXYHEMOGLOBIN: 1.1 % (ref 0.5–1.5)
METHEMOGLOBIN: 0.8 % (ref 0.0–1.5)
O2 Saturation: 66.5 %
Total hemoglobin: 9.4 g/dL — ABNORMAL LOW (ref 12.0–16.0)

## 2016-01-29 LAB — GLUCOSE, CAPILLARY: GLUCOSE-CAPILLARY: 118 mg/dL — AB (ref 65–99)

## 2016-01-29 MED ORDER — FUROSEMIDE 10 MG/ML IJ SOLN
40.0000 mg | Freq: Once | INTRAMUSCULAR | Status: AC
  Administered 2016-01-29: 40 mg via INTRAVENOUS
  Filled 2016-01-29: qty 4

## 2016-01-29 MED ORDER — POTASSIUM CHLORIDE CRYS ER 20 MEQ PO TBCR
40.0000 meq | EXTENDED_RELEASE_TABLET | Freq: Once | ORAL | Status: AC
  Administered 2016-01-29: 40 meq via ORAL
  Filled 2016-01-29: qty 2

## 2016-01-29 MED ORDER — PANTOPRAZOLE SODIUM 40 MG PO TBEC
40.0000 mg | DELAYED_RELEASE_TABLET | Freq: Two times a day (BID) | ORAL | Status: DC
  Administered 2016-01-29 – 2016-02-05 (×15): 40 mg via ORAL
  Filled 2016-01-29 (×15): qty 1

## 2016-01-29 MED ORDER — DEXTROSE-NACL 5-0.45 % IV SOLN
INTRAVENOUS | Status: AC
  Administered 2016-01-29: 10:00:00 via INTRAVENOUS

## 2016-01-29 NOTE — Progress Notes (Signed)
Speech Language Pathology Treatment: Dysphagia  Patient Details Name: Vincent Gibson MRN: [id number] DOB: [birth year] Today's Date: 01/29/2016 Time: 7225-7505 SLP Time Calculation (min) (ACUTE ONLY): 10 min  Assessment / Plan / Recommendation Clinical Impression  Pt reports his swallow ability to be significant improved since endoscopy.  He does admit to issues with reflux and expectorating secretions in the middle of the night.  Pt is aware of items he should avoid with his reflux including orange and cranberry juices.  SLP observed pt consuming water via straw= timely swallow and clear voice throughout with no indication of airway compromise.  Aspiration risk from oropharyngeal swallow appears low.  Pt denies significant dysphagia prior to admission.  Will sign off, no follow up indicated as pt with esophageal deficits and his improved medical status resolved concerns for acute oropharyngeal deficits.    HPI HPI: Vincent Gibson is a 75 y.o. male with PMH: HTN, a-fib,systolic heart failure, hyperlipidemia who was admitted to hospital after spending 3 days in his bathtub after falling and not being able to get out. He was found by friend who had become worried after not hearing from him. During those 3 days, he had nothing to eat or drink.  Pt is s/p Endoscopy with findings of esophagitis, + hernia.  He has started on clears and reports his swallow ability to be better.       SLP Plan  All goals met     Recommendations  Medication Administration: Whole meds with liquid Compensations: Minimize environmental distractions;Slow rate;Small sips/bites Postural Changes and/or Swallow Maneuvers: Upright 30-60 min after meal (upright as much as able)                Oral Care Recommendations: Oral care BID Follow up Recommendations: None Plan: All goals met       [place], [place] [facility] SLP [phone]

## 2016-01-29 NOTE — Progress Notes (Signed)
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 He stated he was too weak to get up and had not taken any po nor urinated or defecated during that time. On arrival he was alert and oriented, dry mucous membranes and had anasarca with leg swelling R>L with evidence of cellulitis on the right leg. Abdomen was tympanitic and distended. Rectal temperature was 96.20F, glucose 26mg /dl, hypotensive and tachycardic saturating 90% on room air, 99% with 2L O2. Labs were significant for creatinine 3.32, BUN 86, bicarbonate 21, CK 528, troponin 0.36, BNP 2,155. Abdomen with chest x-ray showed abnormal increased left hilar and retrocardiac density in the chest, findings suggestive of ascites and colonic ileus. INR on arrival was supratherapeutic at 6, jumped to 10 10/21. Oral vitamin K given during the day. CT imaging showed cirrhosis (no known history of this) and large volume ascites.   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 NG tube placed, boluses, octreotide and protonix gtt started, IV vitamin K was given. BP remained low despite NS, likely third spacing IV fluid. Pt is DNR and a Jehovah's witness, refusing blood transfusions. Levophed started. After discussion with patient, verbal consent for "blood substitutes" including albumin and Kcentra was obtained, and these were given. INR has normalized and no further signs of bleeding. Levophed was weaned off 10/23.  Assessment & Plan:   Principal Problem:   Sepsis (HCC) Active Problems:   AKI (acute kidney injury) (HCC)   Urinary retention   Prolonged Q-T interval on ECG   Atrial fibrillation (HCC)   Chronic combined  systolic and diastolic CHF (congestive heart failure) (HCC)   Cellulitis and abscess of right lower extremity   Pressure injury of skin   Rhabdomyolysis   GI bleeding   Coagulopathy (HCC)   Lactic acidosis   Elevated troponin   Shock (HCC)   Hypoalbuminemia   RBBB   Bacteremia   Acute respiratory failure (HCC)   Abdominal distention   Right heart failure   Hypovolemic shock due to hematemesis with acute blood loss anemia:   -Patient with concerns for auto anticoagulation secondary to 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 Repeat CBC in the morning -Patient's religious believes,patient is unable to receive blood transfusion -Labs reviewed. INR continues to rise despite multiple doses of vitamin K. IV vitamin K to be given per GI recommendations  Severe sepsis from Morganella bacteremia/RLE cellulitis:  -Elevated lactate has since normalized -Patient noted to have  1/2 morbanella species and blood cultures sensitive to Rocephin.  -Patient continued on antibiotics as per below  Acute renal failure/anasarca ?hepatorenal syndrome:  -Presenting creatinine of 3.32, unclear baseline creatinine -There is suggestion of prerenal disease. -Creatinine further improved to 2.49 today -Repeat comprehensive metabolic panel tomorrow morning  Chronic systolic heart failure and right heart failure:  -Patient with EF of 45-50% with mild LVH on 2-D echo -Cardiology continues to follow, appreciate input -Plan to dose Lasix today and monitor response. Consideration for low-dose milrinone to support blood pressure for diuresis. -Patient remains volume overloaded -Overall difficult situation  Atrial fibrillation:  -Presently rate controlled -  Currently not candidate for anticoagulation given concerns for acute blood loss anemia -Beta  blockers on hold secondary to recent low blood pressure  Acute respiratory failure:  -O2 requirements at 4 L this morning (was 2 L yesterday) -Continue to wean O2 as tolerated  Colonic ileus:  -Noted on recent CT scan -Clinically improved and patient is tolerating diet -GI is following -This morning, patient's abdomen appears distended and tympanic. Will order x-ray of the abdomen  Ascites: -Large ascites seen on presenting CT abd -On my exam this AM, abd noted to be tympanic -Xray per above. If no evidence of obstruction, then consider US guided paracentesis  Mild rhabdomyolysis:  -Patient found laying in bathtub prior to admission -CK has normalized  Severe deconditioning:  -Recommendation for skilled nursing facility per therapy recommendations  Prolonged QTc interval:  -Presenting QTC of 537. -We'll continue patient on telemetry  Urinary retention:  -Markedly enlarged prostate was seen on CT scan. -Patient continues with ongoing Foley catheter placed by urology -Plan for voiding trial closer to discharge  Coagulopathy -INR continues to rise despite stopping Coumadin and after receiving multiple doses of vitamin K.  -Per GI, trial of IV vitamin K today -Patient is Jehovah's Witness, therefore likely unable to give FFP if needed -Plan to repeat CBC and INR morning  DVT prophylaxis: SCDs Code Status: DO NOT RESUSCITATE Family Communication: Patient in room, family not at bedside Disposition Plan: Uncertain at this time  Consultants:   Gastroenterology  Cardiology  Pulmonary critical care  Procedures:   Foley placement by Dr. McDiarmid [facility] on 01/20/2016  Right lower extremity Doppler ultrasound 01/22/2016 negative for DVT  Right upper extremity Doppler ultrasound on 01/22/2016 negative for SVT or DVT  Patient developed hematemesis on 01/25/2016, initiated Levothroid until 01/26/2016  EGD by gastroenterology on  01/17/2016  Antimicrobials: Anti-infectives    Start     Dose/Rate Route Frequency Ordered Stop   01/26/16 1000  cefTRIAXone (ROCEPHIN) 2 g in dextrose 5 % 50 mL IVPB     2 g 100 mL/hr over 30 Minutes Intravenous Every 24 hours 01/26/16 0849     01/25/16 1600  vancomycin (VANCOCIN) 1,500 mg in sodium chloride 0.9 % 500 mL IVPB  Status:  Discontinued     1,500 mg 250 mL/hr over 120 Minutes Intravenous Every 48 hours 01/25/2016 1450 01/24/16 1048   01/24/16 1600  vancomycin (VANCOCIN) 1,500 mg in sodium chloride 0.9 % 500 mL IVPB  Status:  Discontinued     1,500 mg 250 mL/hr over 120 Minutes Intravenous Every 48 hours 01/24/16 1048 01/26/16 0816   01/06/2016 2200  piperacillin-tazobactam (ZOSYN) IVPB 2.25 g  Status:  Discontinued     2.25 g 100 mL/hr over 30 Minutes Intravenous Every 6 hours 01/12/2016 1450 01/26/16 0849   01/21/2016 1445  vancomycin (VANCOCIN) 1,500 mg in sodium chloride 0.9 % 500 mL IVPB  Status:  Discontinued     1,500 mg 250 mL/hr over 120 Minutes Intravenous STAT 02/02/2016 1440 01/24/16 1048   01/30/2016 1430  piperacillin-tazobactam (ZOSYN) IVPB 3.375 g     3.375 g 100 mL/hr over 30 Minutes Intravenous  Once 01/21/2016 1420 01/05/2016 1508   01/22/2016 1430  vancomycin (VANCOCIN) IVPB 1000 mg/200 mL premix  Status:  Discontinued     1,000 mg 200 mL/hr over 60 Minutes Intravenous  Once 01/11/2016 1420 01/26/2016 1608      Subjective: Without complaints  Objective: Vitals:   01/29/16 0900 01/29/16 1000 01/29/16 1100 01/29/16 1200  BP: 103/62 112/67 Marland Kitchen)  90/48 107/71  Pulse: (!) 47 67 (!) 103 (!) 104  Resp: 16 (!) 8 11 20   Temp:    (!) 96.8 F (36 C)  TempSrc:    Axillary  SpO2: 100% 95% 99% 96%  Weight:      Height:        Intake/Output Summary (Last 24 hours) at 01/29/16 1401 Last data filed at 01/29/16 1200  Gross per 24 hour  Intake             1190 ml  Output             2675 ml  Net            -1485 ml   Filed Weights   01/27/16 0306 2016-02-04 0322 01/29/16 0430   Weight: 131 kg (288 lb 12.8 oz) 131 kg (288 lb 12.8 oz) 131.5 kg (289 lb 14.5 oz)    Examination:  General exam:Lying in bed, no acute distress Respiratory system: No audible wheezing, mildly increased respiratory effort Cardiovascular system: Regular rate, S1-S2 Gastrointestinal system: Distended, tympanic, positive bowel sounds, generally tender Central nervous system: No tremors, sensation intact Extremities: Perfused, no clubbing Skin: Normal skin turgor, no notable skin lesions seen Psychiatry: Normal mood, no visual hallucinations   Data Reviewed: I have personally reviewed following labs and imaging studies  CBC:  Recent Labs Lab 01/31/2016 1100  01/26/16 0300 01/26/16 1500 01/27/16 0600 Feb 04, 2016 0348 01/29/16 0435  WBC 13.3*  < > 19.9* 19.1* 14.7* 11.7* 9.6  NEUTROABS 12.3*  --   --   --  13.2*  --   --   HGB 11.9*  < > 9.9* 9.7* 9.4* 10.0* 9.9*  HCT 34.8*  < > 29.4* 28.7* 27.9* 30.1* 29.5*  MCV 93.0  < > 94.2 93.8 93.3 95.3 95.5  PLT 205  < > 141* 109* 95* 103* 110*  < > = values in this interval not displayed. Basic Metabolic Panel:  Recent Labs Lab 01/04/2016 1100  01/26/16 0300 01/26/16 1500 01/27/16 0600 February 04, 2016 0348 01/29/16 0435  NA 143  < > 145 147* 147* 147* 150*  K 4.3  < > 3.3* 3.3* 3.4* 3.3* 3.4*  CL 105  < > 111 112* 110 114* 117*  CO2 21*  < > 25 25 25 26 25   GLUCOSE 90  < > 152* 148* 129* 122* 116*  BUN 86*  < > 94* 96* 97* 96* 92*  CREATININE 3.32*  < > 2.89* 3.01* 2.95* 2.78* 2.49*  CALCIUM 8.9  < > 8.4* 8.6* 8.5* 8.5* 8.6*  MG 2.4  --   --  2.2  --  2.1  --   < > = values in this interval not displayed. GFR: Estimated Creatinine Clearance: 35.5 mL/min (by C-G formula based on SCr of 2.49 mg/dL (H)). Liver Function Tests:  Recent Labs Lab 01/26/16 0300 01/26/16 1500 01/27/16 0600 02-04-2016 0348 01/29/16 0435  AST 21 21 20 21 22   ALT 20 20 18 17 17   ALKPHOS 55 51 50 49 49  BILITOT 2.6* 2.4* 2.2* 2.3* 2.7*  PROT 6.4* 6.0* 6.1*  6.2* 6.2*  ALBUMIN 2.6* 2.4* 2.5* 2.5* 2.4*   No results for input(s): LIPASE, AMYLASE in the last 168 hours. No results for input(s): AMMONIA in the last 168 hours. Coagulation Profile:  Recent Labs Lab 01/26/16 0300 01/26/16 1055 01/27/16 0600 Feb 04, 2016 0348 01/29/16 0435  INR 1.60 1.76 2.23 2.80 3.70   Cardiac Enzymes:  Recent Labs Lab 01/16/2016 1100 01/24/16 1312  01/25/16 0313 01/26/16 1500  CKTOTAL 528*  --  225 50  TROPONINI 0.36* 0.25*  --   --    BNP (last 3 results) No results for input(s): PROBNP in the last 8760 hours. HbA1C: No results for input(s): HGBA1C in the last 72 hours. CBG:  Recent Labs Lab 01/26/16 0418 01/26/16 0804 01/26/16 1139 01/13/2016 1120 01/29/16 1140  GLUCAP 115* 130* 124* 101* 118*   Lipid Profile: No results for input(s): CHOL, HDL, LDLCALC, TRIG, CHOLHDL, LDLDIRECT in the last 72 hours. Thyroid Function Tests:  Recent Labs  01/26/2016 0348  TSH 0.709   Anemia Panel:  Recent Labs  01/27/16 0500  TIBC 168*  IRON 108   Sepsis Labs:  Recent Labs Lab 01/11/2016 1453  01/08/2016 2114 01/24/16 0946 01/24/16 1312 01/26/16 0300  PROCALCITON 16.25  --   --   --   --   --   LATICACIDVEN 3.0*  < > 2.6* 2.2* 2.0* 1.0  < > = values in this interval not displayed.  Recent Results (from the past 240 hour(s))  Culture, blood (routine x 2)     Status: None   Collection Time: 01/13/2016 12:48 PM  Result Value Ref Range Status   Specimen Description BLOOD LEFT ANTECUBITAL  Final   Special Requests BOTTLES DRAWN AEROBIC AND ANAEROBIC 5 CC EA  Final   Culture   Final    NO GROWTH 5 DAYS Performed at [facility]    Report Status 01/29/2016 FINAL  Final  Culture, blood (routine x 2)     Status: Abnormal   Collection Time: 01/22/2016 12:48 PM  Result Value Ref Range Status   Specimen Description BLOOD LEFT ANTECUBITAL  Final   Special Requests BOTTLES DRAWN AEROBIC AND ANAEROBIC 5 CC EA  Final   Culture  Setup Time   Final     GRAM NEGATIVE RODS ANAEROBIC BOTTLE ONLY CRITICAL RESULT CALLED TO, READ BACK BY AND VERIFIED WITH: Haze Boyden, PHARM AT 0813 ON [id number] BY Lucienne Capers Performed at [facility]    Culture [facility] MORGANII (A)  Final   Report Status 01/26/2016 FINAL  Final   Organism ID, Bacteria MORGANELLA MORGANII  Final      Susceptibility   Morganella morganii - MIC*    AMPICILLIN >=32 RESISTANT Resistant     CEFAZOLIN >=64 RESISTANT Resistant     CEFEPIME <=1 SENSITIVE Sensitive     CEFTAZIDIME <=1 SENSITIVE Sensitive     CEFTRIAXONE <=1 SENSITIVE Sensitive     CIPROFLOXACIN <=0.25 SENSITIVE Sensitive     GENTAMICIN <=1 SENSITIVE Sensitive     IMIPENEM 4 SENSITIVE Sensitive     TRIMETH/SULFA <=20 SENSITIVE Sensitive     AMPICILLIN/SULBACTAM >=32 RESISTANT Resistant     PIP/TAZO <=4 SENSITIVE Sensitive     * MORGANELLA MORGANII  Blood Culture ID Panel (Reflexed)     Status: None   Collection Time: 01/22/2016 12:48 PM  Result Value Ref Range Status   Enterococcus species NOT DETECTED NOT DETECTED Final   Listeria monocytogenes NOT DETECTED NOT DETECTED Final   Staphylococcus species NOT DETECTED NOT DETECTED Final   Staphylococcus aureus NOT DETECTED NOT DETECTED Final   Streptococcus species NOT DETECTED NOT DETECTED Final   Streptococcus agalactiae NOT DETECTED NOT DETECTED Final   Streptococcus pneumoniae NOT DETECTED NOT DETECTED Final   Streptococcus pyogenes NOT DETECTED NOT DETECTED Final   Acinetobacter baumannii NOT DETECTED NOT DETECTED Final   Enterobacteriaceae species NOT DETECTED NOT DETECTED Final   Enterobacter  cloacae complex NOT DETECTED NOT DETECTED Final   Escherichia coli NOT DETECTED NOT DETECTED Final   Klebsiella oxytoca NOT DETECTED NOT DETECTED Final   Klebsiella pneumoniae NOT DETECTED NOT DETECTED Final   Proteus species NOT DETECTED NOT DETECTED Final   Serratia marcescens NOT DETECTED NOT DETECTED Final   Haemophilus influenzae NOT DETECTED NOT  DETECTED Final   Neisseria meningitidis NOT DETECTED NOT DETECTED Final   Pseudomonas aeruginosa NOT DETECTED NOT DETECTED Final   Candida albicans NOT DETECTED NOT DETECTED Final   Candida glabrata NOT DETECTED NOT DETECTED Final   Candida krusei NOT DETECTED NOT DETECTED Final   Candida parapsilosis NOT DETECTED NOT DETECTED Final   Candida tropicalis NOT DETECTED NOT DETECTED Final    Comment: Performed at [facility]  Culture, Urine     Status: None   Collection Time: 17-Feb-2016  5:16 PM  Result Value Ref Range Status   Specimen Description URINE, RANDOM  Final   Special Requests NONE  Final   Culture NO GROWTH Performed at [facility]   Final   Report Status 01/24/2016 FINAL  Final  MRSA PCR Screening     Status: None   Collection Time: 02-17-2016  5:21 PM  Result Value Ref Range Status   MRSA by PCR NEGATIVE NEGATIVE Final    Comment:        The GeneXpert MRSA Assay (FDA approved for NASAL specimens only), is one component of a comprehensive MRSA colonization surveillance program. It is not intended to diagnose MRSA infection nor to guide or monitor treatment for MRSA infections.      Radiology Studies: No results found.  Scheduled Meds: . atorvastatin  40 mg Oral q1800  . cefTRIAXone (ROCEPHIN)  IV  2 g Intravenous Q24H  . lidocaine  1 application Urethral Once  . pantoprazole  40 mg Oral BID AC  . phytonadione  5 mg Oral Daily  . sodium chloride flush  10 mL Intravenous Q8H  . sucralfate  1 g Oral TID WC & HS   Continuous Infusions: . dextrose 5 % and 0.45% NaCl 50 mL/hr at 01/29/16 1024     LOS: 6 days   Beyonca Wisz, Scheryl Marten, MD [facility] Pager [phone]  If 7PM-7AM, please contact night-coverage [email or web address] Password [email or web address] 01/29/2016, 2:01 PM

## 2016-01-29 NOTE — Progress Notes (Signed)
PT Cancellation Note  Patient Details Name: Vincent HatchetJohn W Gibson MRN: [id number] DOB: 10-12-1940   Cancelled Treatment:    Reason Eval/Treat Not Completed: Patient declined, no reason specified (states that he is on his dough nut cushion brought from home and is comfortable.)   Rada HayHill, Burlon Centrella Elizabeth 01/29/2016, 4:13 PM Blanchard KelchKaren Aissata [place] PT [phone]

## 2016-01-29 NOTE — Progress Notes (Signed)
[facility] PROGRESS NOTE Subjective Pt reports swallowing better. No gross bleeding. Tolerating full liquids.  Objective: Vital signs in last 24 hours: Temp:  [96.4 F (35.8 C)-97.8 F (36.6 C)] 96.9 F (36.1 C) (10/26 0800) Pulse Rate:  [41-109] 104 (10/26 1200) Resp:  [8-25] 20 (10/26 1200) BP: (88-120)/(48-76) 107/71 (10/26 1200) SpO2:  [88 %-100 %] 96 % (10/26 1200) Weight:  [131.5 kg (289 lb 14.5 oz)] 131.5 kg (289 lb 14.5 oz) (10/26 0430) Last BM Date: 03-28-2016  Intake/Output from previous day: 10/25 0701 - 10/26 0700 In: 1375.8 [P.O.:360; I.V.:915.8; IV Piggyback:100] Out: 1550 [Urine:1550] Intake/Output this shift: Total I/O In: 205 [I.V.:155; IV Piggyback:50] Out: 1225 [Urine:1225]  PE: General--angry about chair  Abdomen--obese, nontender  Lab Results:  Recent Labs  01/26/16 1500 01/27/16 0600 03-28-2016 0348 01/29/16 0435  WBC 19.1* 14.7* 11.7* 9.6  HGB 9.7* 9.4* 10.0* 9.9*  HCT 28.7* 27.9* 30.1* 29.5*  PLT 109* 95* 103* 110*   BMET  Recent Labs  01/26/16 1500 01/27/16 0600 03-28-2016 0348 01/29/16 0435  NA 147* 147* 147* 150*  K 3.3* 3.4* 3.3* 3.4*  CL 112* 110 114* 117*  CO2 25 25 26 25   CREATININE 3.01* 2.95* 2.78* 2.49*   LFT  Recent Labs  01/27/16 0600 03-28-2016 0348 01/29/16 0435  PROT 6.1* 6.2* 6.2*  AST 20 21 22   ALT 18 17 17   ALKPHOS 50 49 49  BILITOT 2.2* 2.3* 2.7*   PT/INR  Recent Labs  01/27/16 0600 03-28-2016 0348 01/29/16 0435  LABPROT 25.1* 30.1* 37.6*  INR 2.23 2.80 3.70   PANCREAS No results for input(s): LIPASE in the last 72 hours.       Studies/Results: No results found.  Medications: I have reviewed the patient's current medications.  Assessment/Plan: 1. UGI Bleed. EGd shows esophagitis, no varices. On PPI and carafate 2. Increase INR. Not sure what is causing this. ? Of liver disease but LFTs stable ? Give IV vit K to see if responds Willl change to PO ppi.   Vincent Gibson,Vincent Nitta  Gibson 01/29/2016, 12:46 PM  This note was created using voice recognition software. Minor errors may Have occurred unintentionally.  Pager: [phone] If no answer or after hours call [phone]

## 2016-01-29 NOTE — Progress Notes (Signed)
Patient Name: Vincent Gibson Date of Encounter: 01/29/2016  Primary Cardiologist: Dr. Karl Luke Problem List     Principal Problem:   Sepsis [facility]) Active Problems:   AKI (acute kidney injury) (HCC)   Urinary retention   Prolonged Q-T interval on ECG   Atrial fibrillation (HCC)   Chronic combined systolic and diastolic CHF (congestive heart failure) (HCC)   Cellulitis and abscess of right lower extremity   Pressure injury of skin   Rhabdomyolysis   GI bleeding   Coagulopathy (HCC)   Lactic acidosis   Elevated troponin   Shock (HCC)   Hypoalbuminemia   RBBB   Bacteremia   Acute respiratory failure (HCC)   Abdominal distention    Subjective   Says he slept well last night. Denies any chest discomfort or palpitations.   Inpatient Medications    Scheduled Meds: . atorvastatin  40 mg Oral q1800  . cefTRIAXone (ROCEPHIN)  IV  2 g Intravenous Q24H  . lidocaine  1 application Urethral Once  . phytonadione  5 mg Oral Daily  . potassium chloride  40 mEq Oral Once  . sodium chloride flush  10 mL Intravenous Q8H  . sucralfate  1 g Oral TID WC & HS   Continuous Infusions: . pantoprozole (PROTONIX) infusion 8 mg/hr (01/29/16 0024)   PRN Meds: acetaminophen **OR** acetaminophen, Glycerin (Adult), senna-docusate   Vital Signs    Vitals:   01/29/16 0300 01/29/16 0430 01/29/16 0446 01/29/16 0500  BP: 104/60  (!) 113/55 104/67  Pulse: (!) 49  90 86  Resp: (!) 9  14 (!) 9  Temp:   97.5 F (36.4 C)   TempSrc:   Oral   SpO2: 92%  92% 91%  Weight:  289 lb 14.5 oz (131.5 kg)    Height:        Intake/Output Summary (Last 24 hours) at 01/29/16 0733 Last data filed at 01/29/16 0500  Gross per 24 hour  Intake          1325.83 ml  Output             1550 ml  Net          -224.17 ml   Filed Weights   01/27/16 0306 01/05/2016 0322 01/29/16 0430  Weight: 288 lb 12.8 oz (131 kg) 288 lb 12.8 oz (131 kg) 289 lb 14.5 oz (131.5 kg)    Physical Exam    GEN: Well  nourished, well developed African American male appearing in no acute distress.  HEENT: Grossly normal.  Neck: Supple, no JVD, carotid bruits, or masses. Cardiac: Irregularly irregular, no murmurs, rubs, or gallops. No clubbing or cyanosis, 2+ lower extremity edema.  Radials/DP/PT 2+ and equal bilaterally.  Respiratory:  Respirations regular and unlabored, clear to auscultation bilaterally. GI: Soft, nontender, nondistended, BS + x 4. MS: no deformity or atrophy. Skin: warm and dry, no rash. Neuro:  Strength and sensation are intact. Psych: AAOx3.  Normal affect.  Labs    CBC  Recent Labs  01/27/16 0600 01/17/2016 0348 01/29/16 0435  WBC 14.7* 11.7* 9.6  NEUTROABS 13.2*  --   --   HGB 9.4* 10.0* 9.9*  HCT 27.9* 30.1* 29.5*  MCV 93.3 95.3 95.5  PLT 95* 103* 110*   Basic Metabolic Panel  Recent Labs  01/26/16 1500  01/31/2016 0348 01/29/16 0435  NA 147*  < > 147* 150*  K 3.3*  < > 3.3* 3.4*  CL 112*  < > 114* 117*  CO2 25  < > 26 25  GLUCOSE 148*  < > 122* 116*  BUN 96*  < > 96* 92*  CREATININE 3.01*  < > 2.78* 2.49*  CALCIUM 8.6*  < > 8.5* 8.6*  MG 2.2  --  2.1  --   < > = values in this interval not displayed. Liver Function Tests  Recent Labs  01/10/2016 0348 01/29/16 0435  AST 21 22  ALT 17 17  ALKPHOS 49 49  BILITOT 2.3* 2.7*  PROT 6.2* 6.2*  ALBUMIN 2.5* 2.4*   No results for input(s): LIPASE, AMYLASE in the last 72 hours. Cardiac Enzymes  Recent Labs  01/26/16 1500  CKTOTAL 50   BNP Invalid input(s): POCBNP D-Dimer No results for input(s): DDIMER in the last 72 hours. Hemoglobin A1C No results for input(s): HGBA1C in the last 72 hours. Fasting Lipid Panel No results for input(s): CHOL, HDL, LDLCALC, TRIG, CHOLHDL, LDLDIRECT in the last 72 hours. Thyroid Function Tests  Recent Labs  01/08/2016 0348  TSH 0.709    Telemetry    Atrial fibrillation, HR in 90's to low-100's.  - Personally Reviewed  ECG    No new tracings.   Radiology      No results found.  Cardiac Studies   Echocardiogram: 01/27/2016 Study Conclusions  - Left ventricle: The cavity size was normal. There was severe   asymmetric hypertrophy of the septum. Systolic function was   normal. The estimated ejection fraction was in the range of 55%   to 60%. Wall motion was normal; there were no regional wall   motion abnormalities. Doppler parameters are consistent with   restrictive physiology, indicative of decreased left ventricular   diastolic compliance and/or increased left atrial pressure. - Ventricular septum: The contour showed diastolic flattening and   systolic flattening. - Aortic valve: There was trivial regurgitation. - Mitral valve: There was mild regurgitation. - Left atrium: The atrium was mildly dilated. - Right ventricle: The cavity size was severely dilated. Systolic   function was moderately reduced. - Right atrium: The atrium was severely dilated. - Atrial septum: The septum bowed from right to left, consistent   with increased right atrial pressure. - Pulmonary arteries: Systolic pressure was severely increased. PA   peak pressure: 75 mm Hg (S). - Line: A venous catheter was visualized with its tip in the right   atrium. - Pericardium, extracardiac: A small pericardial effusion was   identified.  Impressions:  - Normal LV systolic function; severe asymmetric LVH; restrictive   filling; trace TR; mild LAE; mild MR; severe RAE and RVE;   moderate RV dysfunction; mild TR with severely elevated pulmonary   pressure; small pericardial effusion.  Patient Profile     89M w/ PMH of Jehovah's witness (refusal of blood products), atrial fib on Coumadin (INR followed by PCP), chronic combined CHF (EF 45-50%), mild right heart failure, mod pulm HTN, HTN, and dyslipidemia who [facility] with a fall in his bathtub, unable to get up for 3 days. Found to have acute renal failure, mild rhabdomyolysis, troponin of 0.36,  septic shock with lactic acidosis, lower extremity cellulitis, Morganella bacteremia, coagulopathy with INR >10, and prolonged QTC. Developed coffee ground emesis 01/25/16 with acute GIB, progressive anemia/thrombocytopenia and hypernatremia. 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  Assessment & Plan    1. Elevated troponin  - likely demand ischemia in the setting of multiple ongoing medical issues. Can  revisit issue of ischemic testing as an OP given many issues that need to resolved prior to this.  Not on ASA given GIB and continued coagulopathy.  2. Multifactorial shock with GIB  - in the setting of intravascular volume depletion, sepsis and GI bleeding, being managed by IM.  - EGD on 10/25 showed a large hiatal hernia with non-bleeding erosive gastropathy and non-bleeding duodenal ulcers.  -Hgb stable at 9.9.   3. Acute on chronic diastolic CHF - echo this admission shows preserved EF of 55-60% with no regional WMA. Has severe asymmetric LVH with trace TR, mild MR, severe RAE and RVE with severe pulmonary pressure of 75 mmHg with a small pericardial effusion.   - some of third spacing is likely due to hypoalbuminemia and finding of cirrhosis on imaging. IVF have been stopped. High sodium indicates intravascular depletion. - BP has been 88/47 - 120/76 in the past 24 hours. Could attempt slow diuresis later today with improving creatinine if BP allows.   4. Atrial fibrillation with RBBB (unknown if paroxysmal or chronic)  - This patients CHA2DS2-VASc Score and unadjusted Ischemic Stroke Rate (% per year) is equal to 4.8 % stroke rate/year from a score of 4 (CHF, HTN, Age (2)). Was on Warfarin prior to admission. IM treating with Vitamin K given recurrent rise in INR off Coumadin.Not likely to be a candidate for resumption anytime soon given presumed variceal bleed and patient's refusal of PRN blood products.  - Plan  continued rate control for now given his interruption in anticoagulation. SBP in the 80's this morning which hinders the addition of a BB or Cardizem. No Digoxin with AKI.   5. AKI with hyopkalemia/hypernatremia  - creatinine 3.32 on admission, improved to 2.49 on 01/29/2016.  6. Cirrhosis by CT  - could be contributing to thrombocytopenia, persistent coagulopathy off Coumadin, and anasarca.   7. Hypothermia  - per IM.  Signed, Ellsworth Lennox, PA  01/29/2016, 7:33 AM

## 2016-01-30 ENCOUNTER — Inpatient Hospital Stay (HOSPITAL_COMMUNITY): Payer: Medicare Other

## 2016-01-30 DIAGNOSIS — R188 Other ascites: Secondary | ICD-10-CM

## 2016-01-30 LAB — CBC
HEMATOCRIT: 29.8 % — AB (ref 39.0–52.0)
Hemoglobin: 9.8 g/dL — ABNORMAL LOW (ref 13.0–17.0)
MCH: 31.5 pg (ref 26.0–34.0)
MCHC: 32.9 g/dL (ref 30.0–36.0)
MCV: 95.8 fL (ref 78.0–100.0)
PLATELETS: 133 10*3/uL — AB (ref 150–400)
RBC: 3.11 MIL/uL — AB (ref 4.22–5.81)
RDW: 17.5 % — ABNORMAL HIGH (ref 11.5–15.5)
WBC: 9.6 10*3/uL (ref 4.0–10.5)

## 2016-01-30 LAB — TROPONIN I: TROPONIN I: 0.14 ng/mL — AB (ref <0.03)

## 2016-01-30 LAB — BASIC METABOLIC PANEL
Anion gap: 6 (ref 5–15)
BUN: 81 mg/dL — ABNORMAL HIGH (ref 6–20)
CHLORIDE: 115 mmol/L — AB (ref 101–111)
CO2: 28 mmol/L (ref 22–32)
CREATININE: 2.33 mg/dL — AB (ref 0.61–1.24)
Calcium: 8.6 mg/dL — ABNORMAL LOW (ref 8.9–10.3)
GFR calc non Af Amer: 26 mL/min — ABNORMAL LOW (ref >60)
GFR, EST AFRICAN AMERICAN: 30 mL/min — AB (ref >60)
Glucose, Bld: 125 mg/dL — ABNORMAL HIGH (ref 65–99)
POTASSIUM: 3.4 mmol/L — AB (ref 3.5–5.1)
SODIUM: 149 mmol/L — AB (ref 135–145)

## 2016-01-30 LAB — PROTIME-INR
INR: 4.45
Prothrombin Time: 43.6 seconds — ABNORMAL HIGH (ref 11.4–15.2)

## 2016-01-30 LAB — GLUCOSE, CAPILLARY
GLUCOSE-CAPILLARY: 120 mg/dL — AB (ref 65–99)
GLUCOSE-CAPILLARY: 100 mg/dL — AB (ref 65–99)
Glucose-Capillary: 111 mg/dL — ABNORMAL HIGH (ref 65–99)

## 2016-01-30 MED ORDER — NITROGLYCERIN 0.4 MG SL SUBL
0.4000 mg | SUBLINGUAL_TABLET | SUBLINGUAL | Status: DC | PRN
  Administered 2016-01-30 – 2016-02-08 (×3): 0.4 mg via SUBLINGUAL
  Filled 2016-01-30 (×3): qty 1

## 2016-01-30 MED ORDER — METOPROLOL TARTRATE 5 MG/5ML IV SOLN
2.5000 mg | Freq: Once | INTRAVENOUS | Status: AC
  Administered 2016-01-30: 2.5 mg via INTRAVENOUS
  Filled 2016-01-30: qty 5

## 2016-01-30 MED ORDER — FUROSEMIDE 10 MG/ML IJ SOLN
40.0000 mg | Freq: Two times a day (BID) | INTRAMUSCULAR | Status: DC
  Administered 2016-01-30 – 2016-01-31 (×4): 40 mg via INTRAVENOUS
  Filled 2016-01-30 (×5): qty 4

## 2016-01-30 MED ORDER — POTASSIUM CHLORIDE CRYS ER 20 MEQ PO TBCR
60.0000 meq | EXTENDED_RELEASE_TABLET | Freq: Once | ORAL | Status: AC
  Administered 2016-01-30: 60 meq via ORAL
  Filled 2016-01-30: qty 3

## 2016-01-30 NOTE — Progress Notes (Signed)
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 He stated he was too weak to get up and had not taken any po nor urinated or defecated during that time. On arrival he was alert and oriented, dry mucous membranes and had anasarca with leg swelling R>L with evidence of cellulitis on the right leg. Abdomen was tympanitic and distended. Rectal temperature was 96.73F, glucose 26mg /dl, hypotensive and tachycardic saturating 90% on room air, 99% with 2L O2. Labs were significant for creatinine 3.32, BUN 86, bicarbonate 21, CK 528, troponin 0.36, BNP 2,155. Abdomen with chest x-ray showed abnormal increased left hilar and retrocardiac density in the chest, findings suggestive of ascites and colonic ileus. INR on arrival was supratherapeutic at 6, jumped to 10 10/21. Oral vitamin K given during the day. CT imaging showed cirrhosis (no known history of this) and large volume ascites.   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 NG tube placed, boluses, octreotide and protonix gtt started, IV vitamin K was given. BP remained low despite NS, likely third spacing IV fluid. Pt is DNR and a Jehovah's witness, refusing blood transfusions. Levophed started. After discussion with patient, verbal consent for "blood substitutes" including albumin and Kcentra was obtained, and these were given. INR has normalized and no further signs of bleeding. Levophed was weaned off 10/23.  Assessment & Plan:   Principal Problem:   Sepsis (HCC) Active Problems:   AKI (acute kidney injury) (HCC)   Urinary retention   Prolonged Q-T interval on ECG   Atrial fibrillation (HCC)   Chronic combined  systolic and diastolic CHF (congestive heart failure) (HCC)   Cellulitis and abscess of right lower extremity   Pressure injury of skin   Rhabdomyolysis   GI bleeding   Coagulopathy (HCC)   Lactic acidosis   Elevated troponin   Shock (HCC)   Hypoalbuminemia   RBBB   Bacteremia   Acute respiratory failure (HCC)   Abdominal distension   Right heart failure   Hypovolemic shock due to hematemesis with acute blood loss anemia:   -Suspicions for an auto anticoagulation secondary to liver disease -GI is following -Hgb remains stable, labs reviewed -Cont PPI -Will repeat CBC in AM. Follow INR closely -Continue with vitamin k -Earlier in admit, pt's INR had responded to Kcentra  Severe sepsis from Morganella bacteremia/RLE cellulitis:  -Lactic acid normalized -Patient noted to have  1/2 morbanella species and blood cultures sensitive to Rocephin.  -Patient continued on antibiotics as per below  Acute renal failure/anasarca ?hepatorenal syndrome:  -Presenting creatinine of 3.32, unclear baseline creatinine -There is suggestion of prerenal disease vs presenting rhabdo -Cr has improved to 2.33 today -Repeat CMP in AM  Chronic systolic heart failure and right heart failure:  -Patient with EF of 45-50% with mild LVH on 2-D echo -Cardiology continues to follow, appreciate input. Discussed case with Dr. Rennis Golden -Continue with lasix per Cardiology -Remains volume overloaded -see below re: ascites  Atrial fibrillation:  -Currently rate controlled -remains off beta blocker secondary to soft BP -Cardiology following -Not on anticoagulation secondary to concerns of acute blood loss anemia  Acute respiratory failure:  -Patient with increased O2 requirements -Ordered CXR for interval change. Reviewed. Patient  with cardiomegaly with vascular congestion and improving interstitial edema -Continue lasix per Cardiology  Colonic ileus:  -Noted on recent CT scan -Clinically improved  and patient continues to [employer] -GI is following -Abdomen remains distended and tympanic. Abd xray done, results reviewed. Findings of adynamic ileus that appears less severe compared to initial CT.   Ascites: -Large ascites seen on presenting CT abd -Abd remains distended -[place] abd ordered to evaluate amount of ascites in interm.  -INR remains elevated, thus suspect patient would be high risk for periprocedural complications should parecentesis be attempted. Will f/u on Abd [place].  Mild rhabdomyolysis:  -Patient found laying in bathtub prior to admission -Recent CK normal  Severe deconditioning:  -Therapy recs for SNF when discharged  Prolonged QTc interval:  -Presenting QTC of 537. -Continue telemetry monitoring  Urinary retention:  -enlarged prostate on CT scan -Pt continues with foley cath placed by Urology  Coagulopathy -INR continues to rise. Currently over 4 -Patient continues to receive daily vitamin K -INR previously improved with Kcentra -Patient is Jehovah's Witness, therefore likely unable to give FFP, however pt reportedly had FFP in past -Will repeat CBC and INR  DVT prophylaxis: SCDs Code Status: DO NOT RESUSCITATE Family Communication: Patient in room, family not at bedside Disposition Plan: Uncertain at this time  Consultants:   Gastroenterology  Cardiology  Pulmonary critical care  Procedures:   Foley placement by Dr. McDiarmid Urology on 01/05/2016  Right lower extremity Doppler ultrasound 01/05/2016 negative for DVT  Right upper extremity Doppler ultrasound on 02/01/2016 negative for SVT or DVT  Patient developed hematemesis on 01/25/2016, initiated Levothroid until 01/26/2016  EGD by gastroenterology on 01/28/2016  Antimicrobials: Anti-infectives    Start     Dose/Rate Route Frequency Ordered Stop   01/26/16 1000  cefTRIAXone (ROCEPHIN) 2 g in dextrose 5 % 50 mL IVPB     2 g 100 mL/hr over 30 Minutes Intravenous Every 24 hours  01/26/16 0849     01/25/16 1600  vancomycin (VANCOCIN) 1,500 mg in sodium chloride 0.9 % 500 mL IVPB  Status:  Discontinued     1,500 mg 250 mL/hr over 120 Minutes Intravenous Every 48 hours 01/09/2016 1450 01/24/16 1048   01/24/16 1600  vancomycin (VANCOCIN) 1,500 mg in sodium chloride 0.9 % 500 mL IVPB  Status:  Discontinued     1,500 mg 250 mL/hr over 120 Minutes Intravenous Every 48 hours 01/24/16 1048 01/26/16 0816   01/17/2016 2200  piperacillin-tazobactam (ZOSYN) IVPB 2.25 g  Status:  Discontinued     2.25 g 100 mL/hr over 30 Minutes Intravenous Every 6 hours 02/03/2016 1450 01/26/16 0849   01/14/2016 1445  vancomycin (VANCOCIN) 1,500 mg in sodium chloride 0.9 % 500 mL IVPB  Status:  Discontinued     1,500 mg 250 mL/hr over 120 Minutes Intravenous STAT 01/16/2016 1440 01/24/16 1048   01/05/2016 1430  piperacillin-tazobactam (ZOSYN) IVPB 3.375 g     3.375 g 100 mL/hr over 30 Minutes Intravenous  Once 01/25/2016 1420 01/04/2016 1508   01/31/2016 1430  vancomycin (VANCOCIN) IVPB 1000 mg/200 mL premix  Status:  Discontinued     1,000 mg 200 mL/hr over 60 Minutes Intravenous  Once 01/10/2016 1420 01/09/2016 1608      Subjective: No complaints today  Objective: Vitals:   01/30/16 0700 01/30/16 0800 01/30/16 0900 01/30/16 1200  BP: 104/67 (!) 98/58 108/60   Pulse: (!) 112 (!) 39 (!) 38 86  Resp: (!) 21 (!) 21 14 (!) 27  Temp:  97.6 F (36.4 C)    TempSrc:  Axillary    SpO2: 100% 100% 96% 94%  Weight:      Height:        Intake/Output Summary (Last 24 hours) at 01/30/16 1607 Last data filed at 01/30/16 1500  Gross per 24 hour  Intake              410 ml  Output             1325 ml  Net             -915 ml   Filed Weights   02-21-2016 0322 01/29/16 0430 01/30/16 0500  Weight: 131 kg (288 lb 12.8 oz) 131.5 kg (289 lb 14.5 oz) 129.6 kg (285 lb 11.5 oz)    Examination:  General exam:Awake, conversant, no acute distress Respiratory system: Normal respiratory effort, no  wheezing Cardiovascular system: Regular rhythm, S1-S2 Gastrointestinal system: Distended, nontender, decreased bowel sounds Central nervous system: CN II through XII grossly intact, no tremors Extremities: No cyanosis, no joint deformities Skin: No rashes, no cyanosis Psychiatry: Normal affect, no auditory hallucinations  Data Reviewed: I have personally reviewed following labs and imaging studies  CBC:  Recent Labs Lab 01/26/16 1500 01/27/16 0600 21-Feb-2016 0348 01/29/16 0435 01/30/16 0500  WBC 19.1* 14.7* 11.7* 9.6 9.6  NEUTROABS  --  13.2*  --   --   --   HGB 9.7* 9.4* 10.0* 9.9* 9.8*  HCT 28.7* 27.9* 30.1* 29.5* 29.8*  MCV 93.8 93.3 95.3 95.5 95.8  PLT 109* 95* 103* 110* 133*   Basic Metabolic Panel:  Recent Labs Lab 01/26/16 1500 01/27/16 0600 Feb 21, 2016 0348 01/29/16 0435 01/30/16 0500  NA 147* 147* 147* 150* 149*  K 3.3* 3.4* 3.3* 3.4* 3.4*  CL 112* 110 114* 117* 115*  CO2 25 25 26 25 28   GLUCOSE 148* 129* 122* 116* 125*  BUN 96* 97* 96* 92* 81*  CREATININE 3.01* 2.95* 2.78* 2.49* 2.33*  CALCIUM 8.6* 8.5* 8.5* 8.6* 8.6*  MG 2.2  --  2.1  --   --    GFR: Estimated Creatinine Clearance: 37.6 mL/min (by C-G formula based on SCr of 2.33 mg/dL (H)). Liver Function Tests:  Recent Labs Lab 01/26/16 0300 01/26/16 1500 01/27/16 0600 February 21, 2016 0348 01/29/16 0435  AST 21 21 20 21 22   ALT 20 20 18 17 17   ALKPHOS 55 51 50 49 49  BILITOT 2.6* 2.4* 2.2* 2.3* 2.7*  PROT 6.4* 6.0* 6.1* 6.2* 6.2*  ALBUMIN 2.6* 2.4* 2.5* 2.5* 2.4*   No results for input(s): LIPASE, AMYLASE in the last 168 hours. No results for input(s): AMMONIA in the last 168 hours. Coagulation Profile:  Recent Labs Lab 01/26/16 1055 01/27/16 0600 02-21-2016 0348 01/29/16 0435 01/30/16 0500  INR 1.76 2.23 2.80 3.70 4.45*   Cardiac Enzymes:  Recent Labs Lab 01/24/16 1312 01/25/16 0313 01/26/16 1500  CKTOTAL  --  225 50  TROPONINI 0.25*  --   --    BNP (last 3 results) No results for  input(s): PROBNP in the last 8760 hours. HbA1C: No results for input(s): HGBA1C in the last 72 hours. CBG:  Recent Labs Lab 01/26/16 0418 01/26/16 0804 01/26/16 1139 02-21-2016 1120 01/29/16 1140  GLUCAP 115* 130* 124* 101* 118*   Lipid Profile: No results for input(s): CHOL, HDL, LDLCALC, TRIG, CHOLHDL, LDLDIRECT in the last 72 hours. Thyroid Function Tests:  Recent Labs  02-21-16 0348  TSH 0.709   Anemia Panel: No results for input(s):  VITAMINB12, FOLATE, FERRITIN, TIBC, IRON, RETICCTPCT in the last 72 hours. Sepsis Labs:  Recent Labs Lab 10-03-2015 2114 01/24/16 0946 01/24/16 1312 01/26/16 0300  LATICACIDVEN 2.6* 2.2* 2.0* 1.0    Recent Results (from the past 240 hour(s))  Culture, blood (routine x 2)     Status: None   Collection Time: 10-03-2015 12:48 PM  Result Value Ref Range Status   Specimen Description BLOOD LEFT ANTECUBITAL  Final   Special Requests BOTTLES DRAWN AEROBIC AND ANAEROBIC 5 CC EA  Final   Culture   Final    NO GROWTH 5 DAYS [facility]    Report Status 01/22/2016 FINAL  Final  Culture, blood (routine x 2)     Status: Abnormal   Collection Time: 10-03-2015 12:48 PM  Result Value Ref Range Status   Specimen Description BLOOD LEFT ANTECUBITAL  Final   Special Requests BOTTLES DRAWN AEROBIC AND ANAEROBIC 5 CC EA  Final   Culture  Setup Time   Final    GRAM NEGATIVE RODS ANAEROBIC BOTTLE ONLY CRITICAL RESULT CALLED TO, READ BACK BY AND VERIFIED WITH: Haze BoydenN. GLOGOVAC, PHARM AT 0813 ON [id number] BY Lucienne CapersS. YARBROUGH Performed at [facility]    Culture [facility] (A)  Final   Report Status 01/26/2016 FINAL  Final   Organism ID, Bacteria MORGANELLA MORGANII  Final      Susceptibility   Morganella morganii - MIC*    AMPICILLIN >=32 RESISTANT Resistant     CEFAZOLIN >=64 RESISTANT Resistant     CEFEPIME <=1 SENSITIVE Sensitive     CEFTAZIDIME <=1 SENSITIVE Sensitive     CEFTRIAXONE <=1 SENSITIVE Sensitive      CIPROFLOXACIN <=0.25 SENSITIVE Sensitive     GENTAMICIN <=1 SENSITIVE Sensitive     IMIPENEM 4 SENSITIVE Sensitive     TRIMETH/SULFA <=20 SENSITIVE Sensitive     AMPICILLIN/SULBACTAM >=32 RESISTANT Resistant     PIP/TAZO <=4 SENSITIVE Sensitive     * MORGANELLA MORGANII  Blood Culture ID Panel (Reflexed)     Status: None   Collection Time: 10-03-2015 12:48 PM  Result Value Ref Range Status   Enterococcus species NOT DETECTED NOT DETECTED Final   Listeria monocytogenes NOT DETECTED NOT DETECTED Final   Staphylococcus species NOT DETECTED NOT DETECTED Final   Staphylococcus aureus NOT DETECTED NOT DETECTED Final   Streptococcus species NOT DETECTED NOT DETECTED Final   Streptococcus agalactiae NOT DETECTED NOT DETECTED Final   Streptococcus pneumoniae NOT DETECTED NOT DETECTED Final   Streptococcus pyogenes NOT DETECTED NOT DETECTED Final   Acinetobacter baumannii NOT DETECTED NOT DETECTED Final   Enterobacteriaceae species NOT DETECTED NOT DETECTED Final   Enterobacter cloacae complex NOT DETECTED NOT DETECTED Final   Escherichia coli NOT DETECTED NOT DETECTED Final   Klebsiella oxytoca NOT DETECTED NOT DETECTED Final   Klebsiella pneumoniae NOT DETECTED NOT DETECTED Final   Proteus species NOT DETECTED NOT DETECTED Final   Serratia marcescens NOT DETECTED NOT DETECTED Final   Haemophilus influenzae NOT DETECTED NOT DETECTED Final   Neisseria meningitidis NOT DETECTED NOT DETECTED Final   Pseudomonas aeruginosa NOT DETECTED NOT DETECTED Final   Candida albicans NOT DETECTED NOT DETECTED Final   Candida glabrata NOT DETECTED NOT DETECTED Final   Candida krusei NOT DETECTED NOT DETECTED Final   Candida parapsilosis NOT DETECTED NOT DETECTED Final   Candida tropicalis NOT DETECTED NOT DETECTED Final    Comment: [facility]   Culture, Urine     Status: None  Collection Time: 01/18/2016  5:16 PM  Result Value Ref Range Status   Specimen Description URINE, RANDOM   Final   Special Requests NONE  Final   Culture NO GROWTH Performed at [facility]   Final   Report Status 01/24/2016 FINAL  Final  MRSA PCR Screening     Status: None   Collection Time: 01/08/2016  5:21 PM  Result Value Ref Range Status   MRSA by PCR NEGATIVE NEGATIVE Final    Comment:        The GeneXpert MRSA Assay (FDA approved for NASAL specimens only), is one component of a comprehensive MRSA colonization surveillance program. It is not intended to diagnose MRSA infection nor to guide or monitor treatment for MRSA infections.      Radiology Studies: US Abdomen Limited  Result Date: 01/30/2016 CLINICAL DATA:  Evaluate for ascites. EXAM: LIMITED ABDOMEN ULTRASOUND FOR ASCITES TECHNIQUE: Limited ultrasound survey for ascites was performed in all four abdominal quadrants. COMPARISON:  CT 01/24/2016 FINDINGS: Large amount of ascites in all 4 quadrants of the abdomen. Fluid appears to be simple. IMPRESSION: Large amount of ascites. Electronically Signed   By: Richarda Overlie M.D.   On: 01/30/2016 15:49   Dg Chest Port 1 View  Result Date: 01/30/2016 CLINICAL DATA:  Shortness of breath, chest congestion. Fluid retention. History of CHF and hypertension EXAM: PORTABLE CHEST 1 VIEW COMPARISON:  01/25/2016 FINDINGS: Mild cardiomegaly with vascular congestion. Improving interstitial prominence within the lungs, likely improving edema. No confluent opacities or effusions. IMPRESSION: Cardiomegaly with vascular congestion. Improving interstitial edema. Electronically Signed   By: Charlett Nose M.D.   On: 01/30/2016 08:54   Dg Abd Portable 1v  Result Date: 01/29/2016 CLINICAL DATA:  Abdominal distention for "a very long time" per pt. Pt would not give me any specifics on the length of time of the distention. EXAM: PORTABLE ABDOMEN - 1 VIEW COMPARISON:  01/25/2016 FINDINGS: There is increased bowel gas with less bowel distention than noted on the prior study. Rectal tube projects in the  central pelvis. Previously seen nasogastric tube has been removed. IMPRESSION: 1. There is bowel distention consistent with a diffuse colonic adynamic ileus. This appears mildly decreased in severity when compared to the prior exam. Electronically Signed   By: Amie Portland M.D.   On: 01/29/2016 14:40    Scheduled Meds: . atorvastatin  40 mg Oral q1800  . cefTRIAXone (ROCEPHIN)  IV  2 g Intravenous Q24H  . furosemide  40 mg Intravenous BID  . lidocaine  1 application Urethral Once  . pantoprazole  40 mg Oral BID AC  . phytonadione  5 mg Oral Daily  . sodium chloride flush  10 mL Intravenous Q8H  . sucralfate  1 g Oral TID WC & HS   Continuous Infusions:     LOS: 7 days   Cephas Revard, Scheryl Marten, MD [facility] Pager [phone]  If 7PM-7AM, please contact night-coverage [email or web address] Password [email or web address] 01/30/2016, 4:07 PM

## 2016-01-30 NOTE — Clinical Social Work Note (Signed)
Clinical Social Work Assessment  Patient Details  Name: Vincent Gibson MRN: [id number] Date of Birth: 06-Jul-1940  Date of referral:  01/30/16               Reason for consult:  Discharge Planning                Permission sought to share information with:  Chartered certified accountant granted to share information::  Yes, Verbal Permission Granted  Name::        Agency::     Relationship::     Contact Information:     Housing/Transportation Living arrangements for the past 2 months:  Single Family Home Source of Information:  Patient Patient Interpreter Needed:    Criminal Activity/Legal Involvement Pertinent to Current Situation/Hospitalization:  No - Comment as needed Significant Relationships:  Adult Children, [place] Lives with:  Self Do you feel safe going back to the place where you live?  No Need for family participation in patient care:  No (Coment)  Care giving concerns:  No concerns reported at this time.   Social Worker assessment / plan:  Pt hospitalized on 01/27/2016 from home, alone, with AKI. CSW consulted to assist with d/c planning. PN reviewed. PT has recommended SNF at d/c. CSW met with pt this am to review recommendations with pt. No family at bedside but pt reports that his son, from [place], will be visiting on Tuesday. Pt reports that his church friends are his family. Pt is a Jehovah's Witness. Pt agrees with plan for SNF at d/c and has given CSW permission to initiate SNF search. CSW will review bed offers with pt once available and continue to follow to assist with d/c planning.  Employment status:  Retired Nurse, adult PT Recommendations:  [place] / Referral to community resources:  [place]  Patient/Family's Response to care: Pt agrees that [employer] is needed at d/c.  Patient/Family's Understanding of and Emotional Response to Diagnosis, Current Treatment, and Prognosis:   Pt is aware of his medical status. Pt's Church family visits often to offer support. Pt appreciates CSW assistance with dc planning.  Emotional Assessment Appearance:  Appears stated age Attitude/Demeanor/Rapport:   (cooperative) Affect (typically observed):  Calm, Accepting Orientation:  Oriented to Self, Oriented to Place, Oriented to  Time, Oriented to Situation Alcohol / Substance use:  Not Applicable Psych involvement (Current and /or in the community):  No (Comment)  Discharge Needs  Concerns to be addressed:  Discharge Planning Concerns Readmission within the last 30 days:  No Current discharge risk:  None Barriers to Discharge:  No Barriers Identified   Luretha Rued, [place] 01/30/2016, 12:21 PM

## 2016-01-30 NOTE — Progress Notes (Signed)
Patient ID: Vincent HatchetJohn W Gibson, male   DOB: 05/22/1940, 75 y.o.   MRN: [id number] Request received for paracentesis on pt. PT/INR values currently 43.6/4.45. To minimize potential bleeding risks associated with procedure would recommend INR of 2.5 or less before proceeding. Dr. Fredia SorrowYamagata aware of above values and is in agreement with above recommendations.

## 2016-01-30 NOTE — Progress Notes (Signed)
Date:  January 30, 2016 Chart reviewed for concurrent status and case management needs. Will continue to follow the patient for status change: inr elevated, bun and creat elevated, vit.-k for inr, j. Witness and refusing bld products.  In a.fib with rbbb but unable to give Cardizem due to no anticoagulation Discharge Planning: following for needs Expected discharge date: [id number] Marcelle Smiling, BSN, [place], [place]   [phone]

## 2016-01-30 NOTE — Progress Notes (Signed)
CRITICAL VALUE ALERT  Critical value received: Troponin 0.14 Date of notification:  01/30/2016  Time of notification:  1127  Critical value read back:Yes.    Nurse who received alert:  [facility]  [facility] notified

## 2016-01-30 NOTE — [place] FL2 (Signed)
[place] MEDICAID FL2 LEVEL OF CARE SCREENING TOOL     IDENTIFICATION  Patient Name: Vincent Gibson Birthdate: April 17, 1940 Sex: male Admission Date (Current Location): 2016/01/25  [facility] and [place] Number:  Producer, television/film/video and Address:  [facility],  [address] [place], [place] [address]      Provider Number: [id number]  Attending Physician Name and Address:  Jerald Kief, MD  Relative Name and Phone Number:       Current Level of Care: Hospital Recommended Level of Care: Skilled Nursing Facility Prior Approval Number:    Date Approved/Denied:   PASRR Number: [id number]  Discharge Plan: SNF    Current Diagnoses: Patient Active Problem List   Diagnosis Date Noted  . Right heart failure   . Abdominal distension   . Rhabdomyolysis 01/27/2016  . GI bleeding 01/27/2016  . Coagulopathy (HCC) 01/27/2016  . Lactic acidosis 01/27/2016  . Elevated troponin 01/27/2016  . Shock (HCC) 01/27/2016  . Hypoalbuminemia 01/27/2016  . RBBB 01/27/2016  . Bacteremia 01/27/2016  . Acute respiratory failure (HCC)   . Pressure injury of skin 01/24/2016  . AKI (acute kidney injury) (HCC) 25-Jan-2016  . Urinary retention 25-Jan-2016  . Prolonged Q-T interval on ECG Jan 25, 2016  . Atrial fibrillation (HCC) 25-Jan-2016  . Chronic combined systolic and diastolic CHF (congestive heart failure) (HCC) 01-25-16  . Sepsis (HCC) 01/25/16  . Cellulitis and abscess of right lower extremity Jan 25, 2016    Orientation RESPIRATION BLADDER Height & Weight     Self, Time, Situation, Place  O2 Indwelling catheter Weight: 285 lb 11.5 oz (129.6 kg) Height:  5\' 11"  (180.3 cm)  BEHAVIORAL SYMPTOMS/MOOD NEUROLOGICAL BOWEL NUTRITION STATUS  Other (Comment) (no behaviors)   Continent  (Full liquid diet- Expect to progress)  AMBULATORY STATUS COMMUNICATION OF NEEDS Skin   Extensive Assist Verbally Other (Comment) (Venous ulcer left leg, stage 2 on sacrum)                        Personal Care Assistance Level of Assistance  Bathing, Feeding, Dressing Bathing Assistance: Maximum assistance Feeding assistance: Limited assistance Dressing Assistance: Maximum assistance     Functional Limitations Info  Sight, Hearing, Speech Sight Info: Adequate Hearing Info: Adequate Speech Info: Adequate    SPECIAL CARE FACTORS FREQUENCY  PT (By licensed PT), OT (By licensed OT)     PT Frequency: 5x wk OT Frequency: 5x wk            Contractures Contractures Info: Not present    Additional Factors Info  Code Status Code Status Info: DNR             Current Medications (01/30/2016):  This is the current hospital active medication list Current Facility-Administered Medications  Medication Dose Route Frequency Provider Last Rate Last Dose  . acetaminophen (TYLENOL) tablet 650 mg  650 mg Oral Q6H PRN Calvert Cantor, MD   650 mg at 01/24/16 2349   Or  . acetaminophen (TYLENOL) suppository 650 mg  650 mg Rectal Q6H PRN Calvert Cantor, MD      . atorvastatin (LIPITOR) tablet 40 mg  40 mg Oral q1800 Tyrone Nine, MD   40 mg at 01/29/16 1719  . cefTRIAXone (ROCEPHIN) 2 g in dextrose 5 % 50 mL IVPB  2 g Intravenous Q24H Winfield Rast, RPH   2 g at 01/30/16 1015  . furosemide (LASIX) injection 40 mg  40 mg Intravenous BID Chrystie Nose, MD      .  Glycerin (Adult) 2.1 g suppository 1 suppository  1 suppository Rectal PRN Tyrone Nineyan B Grunz, MD   1 suppository at 01/24/16 1307  . lidocaine (XYLOCAINE) 2 % jelly 1 application  1 application Urethral Once Alfredo MartinezScott MacDiarmid, MD      . pantoprazole (PROTONIX) EC tablet 40 mg  40 mg Oral BID AC Carman ChingJames Varnum, MD   40 mg at 01/30/16 0743  . phytonadione (VITAMIN K) tablet 5 mg  5 mg Oral Daily Tyrone Nineyan B Grunz, MD   5 mg at 01/30/16 1016  . senna-docusate (Senokot-S) tablet 1 tablet  1 tablet Oral QHS PRN Calvert CantorSaima Rizwan, MD      . sodium chloride flush (NS) 0.9 % injection 10 mL  10 mL Intravenous Q8H Simonne MartinetPeter E Babcock, NP   10 mL at  01/30/16 0514  . sucralfate (CARAFATE) 1 GM/10ML suspension 1 g  1 g Oral TID WC & HS Carman ChingJames Hendel, MD   Stopped at 01/30/16 1200     Discharge Medications: Please see discharge summary for a list of discharge medications.  Relevant Imaging Results:  Relevant Lab Results:   Additional Information ss # [id number]  Lior Hoen, Dickey GaveJamie Lee, LCSW

## 2016-01-30 NOTE — Progress Notes (Signed)
[facility] PROGRESS NOTE Subjective patient without further gross bleeding. Quite concerning that his INR continues to creep up despite receiving oral vitamin K.  Objective: Vital signs in last 24 hours: Temp:  [96.8 F (36 C)-97.8 F (36.6 C)] 97.8 F (36.6 C) (10/27 0400) Pulse Rate:  [47-122] 112 (10/27 0700) Resp:  [6-24] 21 (10/27 0700) BP: (84-162)/(48-140) 104/67 (10/27 0700) SpO2:  [82 %-100 %] 100 % (10/27 0700) Weight:  [129.6 kg (285 lb 11.5 oz)] 129.6 kg (285 lb 11.5 oz) (10/27 0500) Last BM Date: 02/02/2016  Intake/Output from previous day: 10/26 0701 - 10/27 0700 In: 655 [P.O.:240; I.V.:365; IV Piggyback:50] Out: 2450 [Urine:2450] Intake/Output this shift: No intake/output data recorded.  PE: General-- responsive but complaining about aching all over and feeling weak. Gross anasarca  Abdomen-- distended but nontender bubble bubble ascites  Lab Results:  Recent Labs  01/20/2016 0348 01/29/16 0435 01/30/16 0500  WBC 11.7* 9.6 9.6  HGB 10.0* 9.9* 9.8*  HCT 30.1* 29.5* 29.8*  PLT 103* 110* 133*   BMET  Recent Labs  01/25/2016 0348 01/29/16 0435 01/30/16 0500  NA 147* 150* 149*  K 3.3* 3.4* 3.4*  CL 114* 117* 115*  CO2 26 25 28   CREATININE 2.78* 2.49* 2.33*   LFT  Recent Labs  01/24/2016 0348 01/29/16 0435  PROT 6.2* 6.2*  AST 21 22  ALT 17 17  ALKPHOS 49 49  BILITOT 2.3* 2.7*   PT/INR  Recent Labs  01/19/2016 0348 01/29/16 0435 01/30/16 0500  LABPROT 30.1* 37.6* 43.6*  INR 2.80 3.70 4.45*   PANCREAS No results for input(s): LIPASE in the last 72 hours.       Studies/Results: Dg Abd Portable 1v  Result Date: 01/29/2016 CLINICAL DATA:  Abdominal distention for "a very long time" per pt. Pt would not give me any specifics on the length of time of the distention. EXAM: PORTABLE ABDOMEN - 1 VIEW COMPARISON:  01/25/2016 FINDINGS: There is increased bowel gas with less bowel distention than noted on the prior study. Rectal  tube projects in the central pelvis. Previously seen nasogastric tube has been removed. IMPRESSION: 1. There is bowel distention consistent with a diffuse colonic adynamic ileus. This appears mildly decreased in severity when compared to the prior exam. Electronically Signed   By: Amie Portlandavid  Ormond M.D.   On: 01/29/2016 14:40    Medications: I have reviewed the patient's current medications.  Assessment/Plan: 1. Upper G.I. bleed. Probably due to fairly significant esophagitis. Appears to be stable. Of significance, no gross varices noted at the time of EGD 2. Increasing INR. This is despite oral vitamin K replacement. Suspect due to passive congestion of the liver. CT suggest cirrhosis but his LFTs have otherwise been fairly stable. Question if we need to give him IV vitamin K. 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 Agree with cardiology that paracentesis may be helpful. Would consider bedside ultrasound of abdomen.   Allende JR,Jnyah Brazee L 01/30/2016, 8:02 AM  This note was created using voice recognition software. Minor errors may Have occurred unintentionally.  Pager: [phone] If no answer or after hours call [phone]

## 2016-01-30 NOTE — Clinical Social Work Placement (Signed)
   CLINICAL SOCIAL WORK PLACEMENT  NOTE  Date:  01/30/2016  Patient Details  Name: Vincent Gibson MRN: [id number] Date of Birth: 28-Dec-1940  Clinical Social Work is seeking post-discharge placement for this patient at the Skilled  Nursing Facility level of care (*CSW will initial, date and re-position this form in  chart as items are completed):      Patient/family provided with [facility] Clinical Social Work Department's list of facilities offering this level of care within the geographic area requested by the patient (or if unable, by the patient's family).  Yes   Patient/family informed of their freedom to choose among providers that offer the needed level of care, that participate in Medicare, Medicaid or managed care program needed by the patient, have an available bed and are willing to accept the patient.  Yes   Patient/family informed of [facility]'s ownership interest in [facility] and [facility], as well as of the fact that they are under no obligation to receive care at these facilities.  PASRR submitted to EDS on 01/30/16     PASRR number received on 01/30/16     Existing PASRR number confirmed on       FL2 transmitted to all facilities in geographic area requested by pt/family on 01/30/16     FL2 transmitted to all facilities within larger geographic area on       Patient informed that his/her managed care company has contracts with or will negotiate with certain facilities, including the following:            Patient/family informed of bed offers received.  Patient chooses bed at       Physician recommends and patient chooses bed at      Patient to be transferred to   on  .  Patient to be transferred to facility by       Patient family notified on   of transfer.  Name of family member notified:        PHYSICIAN       Additional Comment:    _______________________________________________ Royetta AsalHaidinger, Nimisha Rathel Lee, LCSW  [phone] 01/30/2016,  1:56 PM

## 2016-01-30 NOTE — Progress Notes (Signed)
Occupational Therapy Treatment Patient Details Name: Vincent HatchetJohn W Gibson MRN: [id number] DOB: December 23, 1940 Today's Date: 01/30/2016    History of present illness Pt is a 75 y.o. male with medical history significant of hypertension, hyperlipidemia, atrial fibrillation, systolic heart failure, EF of 40-98%45-50%, moderate pulmonary hypertension and mild right heart failure admitted for acute kidney injury after remaining in his bathtub for 3-4 days due to inability to get out.   OT comments  Pt agreeable to UE exercises and up to chair with encouragement.  Pt feels weak; educated that short bursts of activity and sitting with help to build his endurance  Follow Up Recommendations  SNF    Equipment Recommendations  3 in 1 bedside comode    Recommendations for Other Services      Precautions / Restrictions Precautions Precautions: Fall Restrictions Weight Bearing Restrictions: No Other Position/Activity Restrictions: Pt limited by edema in all extremities       Mobility Bed Mobility         Supine to sit: Max assist     General bed mobility comments: assist for legs and trunk.    Transfers   Equipment used: Agricultural consultantolling walker (2 wheeled) Transfers: Sit to/from RaytheonStand;Stand Pivot Transfers Sit to Stand: Mellon FinancialMin assist;+2 safety/equipment Stand pivot transfers: Min assist;+2 safety/equipment       General transfer comment: assist to rise and steady.  Chair very close to bed, but pt sat prematurely    Balance                                   ADL       Grooming: Wash/dry face;Set up;Sitting                   Toilet Transfer: Minimal assistance;+2 for safety/equipment;RW (to recliner)             General ADL Comments: pt reluctantly agreed to OOB to chair.  RN stood by.  Pt does not appear to give full effort with sit to stand.  Sat prematurely.  Assistance (min) to scoot back in chair.  Pt only agreeable to grooming from EOB.  HR 102-135.      Vision                      Perception     Praxis      Cognition   Behavior During Therapy: WFL for tasks assessed/performed Overall Cognitive Status: Within Functional Limits for tasks assessed                       Extremity/Trunk Assessment  Upper Extremity Assessment Upper Extremity Assessment: Generalized weakness (bil UE edema (throughout body))            Exercises Other Exercises Other Exercises: AROM, 2 sets of 5 bil UEs   Shoulder Instructions       General Comments      Pertinent Vitals/ Pain       Pain Assessment: Faces Faces Pain Scale: Hurts whole lot Pain Location: all over Pain Descriptors / Indicators: Aching;Tightness Pain Intervention(s): Limited activity within patient's tolerance;Monitored during session;Repositioned  Home Living Family/patient expects to be discharged to:: Private residence Living Arrangements: Alone Available Help at Discharge: Friend(s);Available PRN/intermittently  Prior Functioning/Environment Level of Independence: Independent with assistive device(s)        Comments: Pt uses rollator in community at all times.     Frequency           Progress Toward Goals  OT Goals(current goals can now be found in the care plan section)        Plan      Co-evaluation                 End of Session     Activity Tolerance Patient limited by fatigue   Patient Left in chair;with call bell/phone within reach;with chair alarm set   Nurse Communication  (Rn present for mobility)        Time: 1610-9604 OT Time Calculation (min): 27 min  Charges: OT General Charges $OT Visit: 1 Procedure OT Treatments $Therapeutic Activity: 23-37 mins  Randol Zumstein 01/30/2016, 11:12 AM   Marica Otter, OTR/L [phone] 01/30/2016

## 2016-01-30 NOTE — Progress Notes (Signed)
CRITICAL VALUE ALERT  Critical value received:  INR:4.45  Date of notification:  01/30/16  Time of notification:  0622  Critical value read back:Yes.    Nurse who received alert:  Porfirio OarSam Averly Ericson RN  MD notified (1st page): Toniann FailKakrakandy  Time of first page:  [phone]  Responding MD: Toniann FailKakrakandy  Time MD responded:  [phone]

## 2016-01-30 NOTE — Progress Notes (Signed)
Patient Name: Vincent Gibson Date of Encounter: 01/30/2016  Primary Cardiologist: Dr. Karl Luke Problem List     Principal Problem:   Sepsis [facility]) Active Problems:   AKI (acute kidney injury) (HCC)   Urinary retention   Prolonged Q-T interval on ECG   Atrial fibrillation (HCC)   Chronic combined systolic and diastolic CHF (congestive heart failure) (HCC)   Cellulitis and abscess of right lower extremity   Pressure injury of skin   Rhabdomyolysis   GI bleeding   Coagulopathy (HCC)   Lactic acidosis   Elevated troponin   Shock (HCC)   Hypoalbuminemia   RBBB   Bacteremia   Acute respiratory failure (HCC)   Abdominal distension   Right heart failure     Patient Profile     71M w/ PMH of Jehovah's witness (refusal of blood products), atrial fib on Coumadin (INR followed by PCP), chronic combined CHF (EF 45-50%), mild right heart failure, mod pulm HTN, HTN, and dyslipidemia who presented to [facility] on 01/18/2016 with a fall in his bathtub, unable to get up for 3 days. Found to have acute renal failure, mild rhabdomyolysis, troponin of 0.36,septic shock with lactic acidosis, lower extremity cellulitis, Morganella bacteremia, coagulopathy with INR >10, and prolonged QTC. Developed coffee ground emesis 01/25/16 with acute GIB, progressive anemia/thrombocytopenia and hypernatremia. 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   Subjective   Not conversing much, but responds when asked if any pain. Response is "no".  Inpatient Medications    Scheduled Meds: . atorvastatin  40 mg Oral q1800  . cefTRIAXone (ROCEPHIN)  IV  2 g Intravenous Q24H  . lidocaine  1 application Urethral Once  . pantoprazole  40 mg Oral BID AC  . phytonadione  5 mg Oral Daily  . sodium chloride flush  10 mL Intravenous Q8H  . sucralfate  1 g Oral TID WC & HS   Continuous Infusions:   PRN Meds: acetaminophen **OR**  acetaminophen, Glycerin (Adult), senna-docusate   Vital Signs    Vitals:   01/30/16 0500 01/30/16 0515 01/30/16 0600 01/30/16 0700  BP: (!) 162/140 (!) 93/56 98/73 104/67  Pulse: 85 63 99 (!) 112  Resp: 16 12 12  (!) 21  Temp:      TempSrc:      SpO2: 100% 100% 99% 100%  Weight: 285 lb 11.5 oz (129.6 kg)     Height:        Intake/Output Summary (Last 24 hours) at 01/30/16 0754 Last data filed at 01/30/16 0600  Gross per 24 hour  Intake              655 ml  Output             2450 ml  Net            -1795 ml   Filed Weights   01/05/2016 0322 01/29/16 0430 01/30/16 0500  Weight: 288 lb 12.8 oz (131 kg) 289 lb 14.5 oz (131.5 kg) 285 lb 11.5 oz (129.6 kg)    Physical Exam   GEN:  in no acute distress, anasarca  HEENT: Grossly normal.  Neck: Supple, no JVD, carotid bruits, or masses. Cardiac: irregularly irregular, tachy rate no murmurs, rubs, or gallops. No clubbing, cyanosis, edema.  Radials/DP/PT 2+ and equal bilaterally.  Respiratory:  Respirations regular and unlabored, clear to auscultation bilaterally. GI: Soft, nontender, distended, BS + x 4. MS: anasarca of upper and lower extremities.  Skin: warm and dry, no rash. Neuro:  Strength and sensation are intact. Psych: AAOx3.  Normal affect.  Labs    CBC  Recent Labs  01/29/16 0435 01/30/16 0500  WBC 9.6 9.6  HGB 9.9* 9.8*  HCT 29.5* 29.8*  MCV 95.5 95.8  PLT 110* 133*   Basic Metabolic Panel  Recent Labs  12/20/2015 0348 01/29/16 0435 01/30/16 0500  NA 147* 150* 149*  K 3.3* 3.4* 3.4*  CL 114* 117* 115*  CO2 26 25 28   GLUCOSE 122* 116* 125*  BUN 96* 92* 81*  CREATININE 2.78* 2.49* 2.33*  CALCIUM 8.5* 8.6* 8.6*  MG 2.1  --   --    Liver Function Tests  Recent Labs  12/20/2015 0348 01/29/16 0435  AST 21 22  ALT 17 17  ALKPHOS 49 49  BILITOT 2.3* 2.7*  PROT 6.2* 6.2*  ALBUMIN 2.5* 2.4*   No results for input(s): LIPASE, AMYLASE in the last 72 hours. Cardiac Enzymes No results for input(s):  CKTOTAL, CKMB, CKMBINDEX, TROPONINI in the last 72 hours. BNP Invalid input(s): POCBNP D-Dimer No results for input(s): DDIMER in the last 72 hours. Hemoglobin A1C No results for input(s): HGBA1C in the last 72 hours. Fasting Lipid Panel No results for input(s): CHOL, HDL, LDLCALC, TRIG, CHOLHDL, LDLDIRECT in the last 72 hours. Thyroid Function Tests  Recent Labs  12/20/2015 0348  TSH 0.709    Telemetry    Atrial fibrillation w/ RVR - Personally Reviewed   Radiology    Dg Abd Portable 1v  Result Date: 01/29/2016 CLINICAL DATA:  Abdominal distention for "a very long time" per pt. Pt would not give me any specifics on the length of time of the distention. EXAM: PORTABLE ABDOMEN - 1 VIEW COMPARISON:  01/25/2016 FINDINGS: There is increased bowel gas with less bowel distention than noted on the prior study. Rectal tube projects in the central pelvis. Previously seen nasogastric tube has been removed. IMPRESSION: 1. There is bowel distention consistent with a diffuse colonic adynamic ileus. This appears mildly decreased in severity when compared to the prior exam. Electronically Signed   By: Amie Portlandavid  Ormond M.D.   On: 01/29/2016 14:40     Patient Profile     77M w/ PMH of Jehovah's witness (refusal of blood products), atrial fib on Coumadin (INR followed by PCP), chronic combined CHF (EF 45-50%), mild right heart failure, mod pulm HTN, HTN, and dyslipidemia who [facility] with a fall in his bathtub, unable to get up for 3 days. Found to have acute renal failure, mild rhabdomyolysis, troponin of 0.36,septic shock with lactic acidosis, lower extremity cellulitis, Morganella bacteremia, coagulopathy with INR >10, and prolonged QTC. Developed coffee ground emesis 01/25/16 with acute GIB, progressive anemia/thrombocytopenia and hypernatremia. 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  Assessment & Plan     1. Elevated troponin  - Peaked at 0.36 - likely demand ischemia in the setting of multiple ongoing medical issues. Can revisit issue of ischemic testing as an OP given many issues that need to resolved prior to this.  Not on ASA given GIB and continued coagulopathy.  2. Multifactorial shock with GIB  - in the setting of intravascular volume depletion, sepsis and GI bleeding, being managed by IM.  - EGD on 10/25 showed a large hiatal hernia with non-bleeding erosive gastropathy and non-bleeding duodenal ulcers.  -Hgb over the last several days, now at 9.8.   3. Acute on  chronic diastolic CHF - echo this admission shows preserved EF of 55-60% with no regional WMA. Has severe asymmetric LVH with trace TR, mild MR, severe RAE and RVE with severe pulmonary pressure of 75 mmHg with a small pericardial effusion.   - some of third spacing islikely due to hypoalbuminemia and finding of cirrhosis on imaging. IVF have been stopped. High sodium indicates intravascular depletion. - remains grossly volume overloaded.  - BP is soft but stable. Renal function improving.-2.4 L out yesterday. Continue IV lasix and monitor BP, electrolytes an renal function.   4. Atrial fibrillation with RBBB (unknown if paroxysmal or chronic)  - This patients CHA2DS2-VASc Score and unadjusted Ischemic Stroke Rate (% per year) is equal to 4.8 % stroke rate/year from a score of 4 (CHF, HTN, Age (2)). Was on Warfarin prior to admission. IM treating with Vitamin K given recurrent rise in INR off Coumadin.Not likely to be a candidate for resumption anytime soon given presumed variceal bleed and patient's refusal of PRN blood products.  - Plan for rate control strategy. Unable to use amiodarone as he is off of Coumadin and we want to avoid chemical conversion. BP improved since yesterday but still a bit soft. Can consider adding either low dose BB or CCB. No Digoxin with AKI.   5. AKI with hyopkalemia/hypernatremia -  creatinine 3.32 on admission, gradual improvement, now at 2.33  6. Cirrhosis by CT  - could be contributing to thrombocytopenia, persistent coagulopathy off Coumadin, and anasarca.   7. Hypothermia  - Per IM   8. Hypokalemia: K is 3.4. Supplement.   9. ? Large Volume Ascites: noted on CT of abdomen. Plain film shows bowel distention consistent with a diffuse colonic adynamic ileus. This appears mildly decreased in severity when compared to the prior exam. GI recommends [place].   Signed, Robbie Lis, PA-C  01/30/2016, 7:54 AM

## 2016-01-30 NOTE — Progress Notes (Signed)
Physical Therapy Treatment Patient Details Name: Vincent HatchetJohn W Gibson MRN: [id number] DOB: 1940/05/03 Today's Date: 01/30/2016    History of Present Illness Pt is a 75 y.o. male with medical history significant of hypertension, hyperlipidemia, atrial fibrillation, systolic heart failure, EF of 40-98%45-50%, moderate pulmonary hypertension and mild right heart failure admitted for acute kidney injury after remaining in his bathtub for 3-4 days due to inability to get out.    PT Comments    Pt OOB in recliner via OT.  Attempted sit to stand twice however pt unable to clear hips off lower surface level recliner and too weak to complete upright stance.  Attempted to use [employer].  Positioned pad under pt however Sky Lift inoperativeable.  Notified RN and Licensed conveyancerunit secretary.   Follow Up Recommendations  SNF     Equipment Recommendations       Recommendations for Other Services       Precautions / Restrictions Precautions Precautions: Fall Restrictions Weight Bearing Restrictions: No Other Position/Activity Restrictions: Pt limited by edema in all extremities    Mobility  Bed Mobility         Supine to sit: Max assist     General bed mobility comments: OOB in recliner via OT  Transfers Overall transfer level: Needs assistance Equipment used: Rolling walker (2 wheeled) Transfers: Sit to/from [employer] CorporationStand;Stand Pivot Transfers Sit to Stand: Max assist;Total assist;+2 physical assistance;+2 safety/equipment (lower level recliner) Stand pivot transfers: Min assist;+2 safety/equipment       General transfer comment: attempted sit to stand twice however pt unable to clear hips off surface and unable to put forth enough strength to complete.  Attempted to use Sky Lift however inoperativeable.  Notified Nursing Staff.    Ambulation/Gait             General Gait Details: non amb at this time   Information systems managertairs            Wheelchair Mobility    Modified Rankin (Stroke Patients Only)        Balance                                    Cognition Arousal/Alertness: Awake/alert Behavior During Therapy: WFL for tasks assessed/performed Overall Cognitive Status: Within Functional Limits for tasks assessed                      Exercises Other Exercises Other Exercises: AROM, 2 sets of 5 bil UEs    General Comments        Pertinent Vitals/Pain Pain Assessment: No/denies pain Faces Pain Scale: Hurts whole lot Pain Location: all over Pain Descriptors / Indicators: Aching;Tightness Pain Intervention(s): Limited activity within patient's tolerance;Monitored during session;Repositioned    Home Living Family/patient expects to be discharged to:: Private residence Living Arrangements: Alone Available Help at Discharge: Friend(s);Available PRN/intermittently                Prior Function Level of Independence: Independent with assistive device(s)      Comments: Pt uses rollator in community at all times.     PT Goals (current goals can now be found in the care plan section) Progress towards PT goals: Progressing toward goals    Frequency    Min 3X/week      PT Plan Current plan remains appropriate    Co-evaluation             End of  Session Equipment Utilized During Treatment: Oxygen (5 lts) Activity Tolerance: Patient limited by fatigue Patient left: in chair;with call bell/phone within reach     Time: 1138-1224 PT Time Calculation (min) (ACUTE ONLY): 46 min  Charges:  $Therapeutic Activity: 38-52 mins                    G Codes:      Felecia Shelling  PTA [facility]  Acute  Rehab Pager      [phone]

## 2016-01-31 DIAGNOSIS — I5041 Acute combined systolic (congestive) and diastolic (congestive) heart failure: Secondary | ICD-10-CM

## 2016-01-31 DIAGNOSIS — R188 Other ascites: Secondary | ICD-10-CM

## 2016-01-31 LAB — BASIC METABOLIC PANEL
Anion gap: 6 (ref 5–15)
BUN: 75 mg/dL — AB (ref 6–20)
CALCIUM: 8.7 mg/dL — AB (ref 8.9–10.3)
CO2: 29 mmol/L (ref 22–32)
CREATININE: 1.95 mg/dL — AB (ref 0.61–1.24)
Chloride: 114 mmol/L — ABNORMAL HIGH (ref 101–111)
GFR calc Af Amer: 37 mL/min — ABNORMAL LOW (ref >60)
GFR, EST NON AFRICAN AMERICAN: 32 mL/min — AB (ref >60)
GLUCOSE: 121 mg/dL — AB (ref 65–99)
POTASSIUM: 3.3 mmol/L — AB (ref 3.5–5.1)
SODIUM: 149 mmol/L — AB (ref 135–145)

## 2016-01-31 LAB — GLUCOSE, CAPILLARY
GLUCOSE-CAPILLARY: 234 mg/dL — AB (ref 65–99)
GLUCOSE-CAPILLARY: 108 mg/dL — AB (ref 65–99)
GLUCOSE-CAPILLARY: 110 mg/dL — AB (ref 65–99)
Glucose-Capillary: 100 mg/dL — ABNORMAL HIGH (ref 65–99)
Glucose-Capillary: 105 mg/dL — ABNORMAL HIGH (ref 65–99)
Glucose-Capillary: 156 mg/dL — ABNORMAL HIGH (ref 65–99)

## 2016-01-31 LAB — CBC
HEMATOCRIT: 30.3 % — AB (ref 39.0–52.0)
Hemoglobin: 10 g/dL — ABNORMAL LOW (ref 13.0–17.0)
MCH: 31.6 pg (ref 26.0–34.0)
MCHC: 33 g/dL (ref 30.0–36.0)
MCV: 95.9 fL (ref 78.0–100.0)
Platelets: 159 10*3/uL (ref 150–400)
RBC: 3.16 MIL/uL — ABNORMAL LOW (ref 4.22–5.81)
RDW: 17.3 % — AB (ref 11.5–15.5)
WBC: 9.7 10*3/uL (ref 4.0–10.5)

## 2016-01-31 LAB — TROPONIN I
TROPONIN I: 0.13 ng/mL — AB (ref <0.03)
Troponin I: 0.12 ng/mL (ref <0.03)

## 2016-01-31 LAB — FIBRINOGEN: Fibrinogen: 419 mg/dL (ref 210–475)

## 2016-01-31 LAB — PROTIME-INR
INR: 4.53 — AB
PROTHROMBIN TIME: 44.2 s — AB (ref 11.4–15.2)

## 2016-01-31 LAB — ABO/RH: ABO/RH(D): B POS

## 2016-01-31 MED ORDER — POTASSIUM CHLORIDE CRYS ER 20 MEQ PO TBCR
40.0000 meq | EXTENDED_RELEASE_TABLET | Freq: Two times a day (BID) | ORAL | Status: AC
  Administered 2016-01-31 (×2): 40 meq via ORAL
  Filled 2016-01-31 (×2): qty 2

## 2016-01-31 MED ORDER — SODIUM CHLORIDE 0.9 % IV SOLN
Freq: Once | INTRAVENOUS | Status: AC
  Administered 2016-01-31: 11:00:00 via INTRAVENOUS

## 2016-01-31 MED ORDER — PHYTONADIONE 5 MG PO TABS
5.0000 mg | ORAL_TABLET | Freq: Every day | ORAL | Status: DC
  Administered 2016-02-01: 5 mg via ORAL
  Filled 2016-01-31: qty 1

## 2016-01-31 MED ORDER — METOCLOPRAMIDE HCL 5 MG/ML IJ SOLN
5.0000 mg | Freq: Four times a day (QID) | INTRAMUSCULAR | Status: DC | PRN
  Administered 2016-01-31 – 2016-02-01 (×3): 5 mg via INTRAVENOUS
  Filled 2016-01-31 (×3): qty 2

## 2016-01-31 MED ORDER — FUROSEMIDE 10 MG/ML IJ SOLN: 20.0000 mg | Freq: Once | INTRAMUSCULAR | Status: DC

## 2016-01-31 MED ORDER — VITAMIN K1 10 MG/ML IJ SOLN
10.0000 mg | Freq: Once | INTRAMUSCULAR | Status: AC
  Administered 2016-01-31: 10 mg via INTRAVENOUS
  Filled 2016-01-31: qty 1

## 2016-01-31 NOTE — Progress Notes (Signed)
Patient has church members at bedside statiing patient is not suppose to have plasma. Explained to patient that he signed a consent stating that he would receive plasma which is a blood product. Patient states he is not sure why he agreed, states he must have been confused. Plasma stopped per patient request and  Dr. Rhona Leavenshiu of situation.

## 2016-01-31 NOTE — Progress Notes (Signed)
Patient Name: Vincent Gibson Date of Encounter: 01/31/2016  Hospital Problem List     Principal Problem:   Sepsis [facility]) Active Problems:   AKI (acute kidney injury) (HCC)   Urinary retention   Prolonged Q-T interval on ECG   Atrial fibrillation (HCC)   Chronic combined systolic and diastolic CHF (congestive heart failure) (HCC)   Cellulitis and abscess of right lower extremity   Pressure injury of skin   Rhabdomyolysis   GI bleeding   Coagulopathy (HCC)   Lactic acidosis   Elevated troponin   Shock (HCC)   Hypoalbuminemia   RBBB   Bacteremia   Acute respiratory failure (HCC)   Abdominal distention   Right heart failure   Ascites    Patient Profile     This is a 75 y.o. male has a past medical history significant for A-fib, chronic congestive heart failure with EF 45-50% (12/2015), hypertension, dyslipidemia and mild right heart failure with moderate pulmonary hypertension, presents with fall in his bathtub. Found to have acute renal failure and mild rhabomyolysis. Went into septic shock and had acute GIB as well. Now off warfarin. Troponin mildly elevated at 0.25, 0.36. BNP was 2155 on admission.    Subjective   Breathing OK.  Looks uncomfortable but not in distress.    Inpatient Medications    . atorvastatin  40 mg Oral q1800  . cefTRIAXone (ROCEPHIN)  IV  2 g Intravenous Q24H  . furosemide  40 mg Intravenous BID  . lidocaine  1 application Urethral Once  . pantoprazole  40 mg Oral BID AC  . phytonadione  5 mg Oral Daily  . sodium chloride flush  10 mL Intravenous Q8H  . sucralfate  1 g Oral TID WC & HS    Vital Signs    Vitals:   01/31/16 0400 01/31/16 0500 01/31/16 0600 01/31/16 0700  BP: 108/74 101/62 96/63 112/68  Pulse: (!) 101 (!) 38 85 (!) 107  Resp: 11 18 14 16   Temp: 97.5 F (36.4 C)     TempSrc: Oral     SpO2: 94% 100% 98% 99%  Weight:      Height:        Intake/Output Summary (Last 24 hours) at 01/31/16 0735 Last data filed at  01/31/16 0648  Gross per 24 hour  Intake              180 ml  Output             3350 ml  Net            -3170 ml   Filed Weights   01/16/2016 0322 01/29/16 0430 01/30/16 0500  Weight: 288 lb 12.8 oz (131 kg) 289 lb 14.5 oz (131.5 kg) 285 lb 11.5 oz (129.6 kg)    Physical Exam    GEN: Well nourished, well developed, in  no acute distress.  Neck: Supple, no JVD, carotid bruits, or masses. Cardiac: RRR, no rubs, or gallops. No clubbing, cyanosis, severe diffuse edema.  Radials/DP/PT 2+ and equal bilaterally.  Respiratory:  Respirations decreased breath sounds regular and unlabored, clear to auscultation bilaterally. GI: Soft, nontender,  distended, BS + x 4. Neuro:  Strength and sensation are intact.   Labs    CBC  Recent Labs  01/30/16 0500 01/31/16 0401  WBC 9.6 9.7  HGB 9.8* 10.0*  HCT 29.8* 30.3*  MCV 95.8 95.9  PLT 133* 159   Basic Metabolic Panel  Recent Labs  01/30/16 0500 01/31/16  0401  NA 149* 149*  K 3.4* 3.3*  CL 115* 114*  CO2 28 29  GLUCOSE 125* 121*  BUN 81* 75*  CREATININE 2.33* 1.95*  CALCIUM 8.6* 8.7*   Liver Function Tests  Recent Labs  01/29/16 0435  AST 22  ALT 17  ALKPHOS 49  BILITOT 2.7*  PROT 6.2*  ALBUMIN 2.4*   No results for input(s): LIPASE, AMYLASE in the last 72 hours. Cardiac Enzymes  Recent Labs  01/30/16 2201 01/31/16 0401  TROPONINI 0.14* 0.13*   BNP Invalid input(s): POCBNP D-Dimer No results for input(s): DDIMER in the last 72 hours. Hemoglobin A1C No results for input(s): HGBA1C in the last 72 hours. Fasting Lipid Panel No results for input(s): CHOL, HDL, LDLCALC, TRIG, CHOLHDL, LDLDIRECT in the last 72 hours. Thyroid Function Tests No results for input(s): TSH, T4TOTAL, T3FREE, THYROIDAB in the last 72 hours.  Invalid input(s): FREET3  Telemetry    Atrial fib with RVR  ECG    NA  Radiology    Ct Abdomen Pelvis Wo Contrast  Result Date: 01/24/2016 CLINICAL DATA:  75 year old male inpatient  with a history of atrial fibrillation on Coumadin, CHF, admitted after being found down for 3 days in the bathtub, treated for sepsis and acute renal failure, now with acute respiratory failure, ileus and abdominal distention. Left lung opacities on chest radiograph. EXAM: CT CHEST, ABDOMEN AND PELVIS WITHOUT CONTRAST TECHNIQUE: Multidetector CT imaging of the chest, abdomen and pelvis was performed following the standard protocol without IV contrast. COMPARISON:  01/24/2016 abdominal radiographs and 01/21/2016 chest radiographs. 07/02/2014 CT abdomen/pelvis. FINDINGS: Examination is significantly limited by patient body habitus, streak artifact from the patient's upper extremities, motion artifact and lack of IV contrast. CT CHEST FINDINGS Cardiovascular: Mild cardiomegaly. No significant pericardial fluid/thickening. Mildly atherosclerotic nonaneurysmal thoracic aorta. Dilated main pulmonary artery (3.7 cm diameter). Mediastinum/Nodes: No discrete thyroid nodules. Unremarkable esophagus. No pathologically enlarged axillary, mediastinal or gross hilar lymph nodes, noting limited sensitivity for the detection of hilar adenopathy on this noncontrast study. Lungs/Pleura: No pneumothorax. Small layering right pleural effusion. No left pleural effusion. There is layering debris in the tracheal lumen at the level of the thoracic inlet. There is a mosaic attenuation throughout both lungs. There are patchy bandlike areas of consolidation with associated volume loss in the bilateral lower lobes, favor atelectasis. Thin parenchymal bands in the right middle lobe and lingula are most consistent with areas of postinfectious/ postinflammatory scarring. No lung masses or significant pulmonary nodules in the aerated portions of the lungs. Musculoskeletal: No aggressive appearing focal osseous lesions. Mild-to-moderate thoracic spondylosis. Anasarca. CT ABDOMEN PELVIS FINDINGS Hepatobiliary: There is relative hypertrophy of the  left liver lobe and the liver surface is diffusely irregular, consistent with cirrhosis. No gross liver mass. Normal gallbladder with no radiopaque cholelithiasis. No biliary ductal dilatation. Pancreas: Normal, with no mass or duct dilation. Spleen: Normal size. No mass. Adrenals/Urinary Tract: No discrete adrenal nodules. No hydronephrosis. No renal stones. Simple 2.3 cm posterior upper right renal cyst. Simple 3.1 cm lateral interpolar left renal cyst. No additional contour deforming renal masses. Bladder is nearly collapsed by indwelling Foley catheter, with the suggestion of diffuse bladder wall thickening. Stomach/Bowel: Grossly normal stomach. Normal caliber small bowel with no small bowel wall thickening. Faintly visualized normal appendix. There is mild dilatation throughout the large bowel with mild stool and scattered fluid levels in the large bowel. No large bowel wall thickening. Vascular/Lymphatic: Mildly atherosclerotic nonaneurysmal abdominal aorta. No gross pathologically enlarged lymph nodes  in the abdomen or pelvis. Reproductive: Markedly enlarged prostate, not appreciably changed. Other: No pneumoperitoneum.  Large volume simple ascites. Musculoskeletal: No aggressive appearing focal osseous lesions. Moderate lumbar spondylosis. Anasarca. IMPRESSION: 1. Cirrhosis. No gross liver mass on this limited noncontrast CT study. Large volume ascites. Normal size spleen. 2. Mild diffuse large bowel dilatation with colonic fluid levels, consistent with mild colonic ileus. 3. Anasarca. 4. Cardiomegaly.  Small layering right pleural effusion. 5. Prominently dilated main pulmonary artery, likely indicating pulmonary arterial hypertension. Mosaic attenuation throughout the lungs could be due to mosaic perfusion from pulmonary vascular disease versus air trapping from small airways disease. 6. Patchy bandlike areas of consolidation with associated volume loss in the lower lungs, favor atelectasis. 7. Markedly  enlarged prostate. 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 Bladder nearly collapsed by indwelling Foley catheter. No hydronephrosis. 8. Aortic atherosclerosis. Electronically Signed   By: Delbert Phenix M.D.   On: 01/24/2016 18:30   Ct Chest Wo Contrast  Result Date: 01/24/2016 CLINICAL DATA:  75 year old male inpatient with a history of atrial fibrillation on Coumadin, CHF, admitted after being found down for 3 days in the bathtub, treated for sepsis and acute renal failure, now with acute respiratory failure, ileus and abdominal distention. Left lung opacities on chest radiograph. EXAM: CT CHEST, ABDOMEN AND PELVIS WITHOUT CONTRAST TECHNIQUE: Multidetector CT imaging of the chest, abdomen and pelvis was performed following the standard protocol without IV contrast. COMPARISON:  01/24/2016 abdominal radiographs and 02/05/2016 chest radiographs. 07/02/2014 CT abdomen/pelvis. FINDINGS: Examination is significantly limited by patient body habitus, streak artifact from the patient's upper extremities, motion artifact and lack of IV contrast. CT CHEST FINDINGS Cardiovascular: Mild cardiomegaly. No significant pericardial fluid/thickening. Mildly atherosclerotic nonaneurysmal thoracic aorta. Dilated main pulmonary artery (3.7 cm diameter). Mediastinum/Nodes: No discrete thyroid nodules. Unremarkable esophagus. No pathologically enlarged axillary, mediastinal or gross hilar lymph nodes, noting limited sensitivity for the detection of hilar adenopathy on this noncontrast study. Lungs/Pleura: No pneumothorax. Small layering right pleural effusion. No left pleural effusion. There is layering debris in the tracheal lumen at the level of the thoracic inlet. There is a mosaic attenuation throughout both lungs. There are patchy bandlike areas of consolidation with associated volume loss in the bilateral lower lobes, favor atelectasis. Thin parenchymal bands in the  right middle lobe and lingula are most consistent with areas of postinfectious/ postinflammatory scarring. No lung masses or significant pulmonary nodules in the aerated portions of the lungs. Musculoskeletal: No aggressive appearing focal osseous lesions. Mild-to-moderate thoracic spondylosis. Anasarca. CT ABDOMEN PELVIS FINDINGS Hepatobiliary: There is relative hypertrophy of the left liver lobe and the liver surface is diffusely irregular, consistent with cirrhosis. No gross liver mass. Normal gallbladder with no radiopaque cholelithiasis. No biliary ductal dilatation. Pancreas: Normal, with no mass or duct dilation. Spleen: Normal size. No mass. Adrenals/Urinary Tract: No discrete adrenal nodules. No hydronephrosis. No renal stones. Simple 2.3 cm posterior upper right renal cyst. Simple 3.1 cm lateral interpolar left renal cyst. No additional contour deforming renal masses. Bladder is nearly collapsed by indwelling Foley catheter, with the suggestion of diffuse bladder wall thickening. Stomach/Bowel: Grossly normal stomach. Normal caliber small bowel with no small bowel wall thickening. Faintly visualized normal appendix. There is mild dilatation throughout the large bowel with mild stool and scattered fluid levels in the large bowel. No large bowel wall thickening. Vascular/Lymphatic: Mildly atherosclerotic nonaneurysmal abdominal aorta. No gross pathologically enlarged lymph nodes in the abdomen or pelvis. Reproductive: Markedly enlarged  prostate, not appreciably changed. Other: No pneumoperitoneum.  Large volume simple ascites. Musculoskeletal: No aggressive appearing focal osseous lesions. Moderate lumbar spondylosis. Anasarca. IMPRESSION: 1. Cirrhosis. No gross liver mass on this limited noncontrast CT study. Large volume ascites. Normal size spleen. 2. Mild diffuse large bowel dilatation with colonic fluid levels, consistent with mild colonic ileus. 3. Anasarca. 4. Cardiomegaly.  Small layering right  pleural effusion. 5. Prominently dilated main pulmonary artery, likely indicating pulmonary arterial hypertension. Mosaic attenuation throughout the lungs could be due to mosaic perfusion from pulmonary vascular disease versus air trapping from small airways disease. 6. Patchy bandlike areas of consolidation with associated volume loss in the lower lungs, favor atelectasis. 7. Markedly enlarged prostate. 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 Bladder nearly collapsed by indwelling Foley catheter. No hydronephrosis. 8. Aortic atherosclerosis. Electronically Signed   By: Delbert Phenix M.D.   On: 01/24/2016 18:30   US Abdomen Limited  Result Date: 01/30/2016 CLINICAL DATA:  Evaluate for ascites. EXAM: LIMITED ABDOMEN ULTRASOUND FOR ASCITES TECHNIQUE: Limited ultrasound survey for ascites was performed in all four abdominal quadrants. COMPARISON:  CT 01/24/2016 FINDINGS: Large amount of ascites in all 4 quadrants of the abdomen. Fluid appears to be simple. IMPRESSION: Large amount of ascites. Electronically Signed   By: Richarda Overlie M.D.   On: 01/30/2016 15:49   Dg Chest Port 1 View  Result Date: 01/30/2016 CLINICAL DATA:  Shortness of breath, chest congestion. Fluid retention. History of CHF and hypertension EXAM: PORTABLE CHEST 1 VIEW COMPARISON:  01/25/2016 FINDINGS: Mild cardiomegaly with vascular congestion. Improving interstitial prominence within the lungs, likely improving edema. No confluent opacities or effusions. IMPRESSION: Cardiomegaly with vascular congestion. Improving interstitial edema. Electronically Signed   By: Charlett Nose M.D.   On: 01/30/2016 08:54   Dg Chest Port 1 View  Result Date: 01/25/2016 CLINICAL DATA:  PICC line placement.  Nasogastric tube placement. EXAM: PORTABLE CHEST 1 VIEW COMPARISON:  01/25/2016 FINDINGS: Nasogastric tube tip is in the stomach body. Right-sided PICC line tip projects over the SVC. Leftward shift  of cardiac and mediastinal structures. Bilateral interstitial accentuation. Enlarged main pulmonary artery. Left costophrenic angle excluded. IMPRESSION: 1. Nasogastric tube tip in the stomach body, PICC line tip in the SVC. 2. Potential mild cardiomegaly, with some leftward shift of the heart possibly due to left-sided atelectasis. 3. Prominent main pulmonary artery, potentially with some left perihilar atelectasis. 4. Interstitial accentuation could be from atypical pneumonia or mild interstitial edema. Electronically Signed   By: Gaylyn Rong M.D.   On: 01/25/2016 18:38   Dg Chest Port 1 View  Result Date: 01/25/2016 CLINICAL DATA:  Respiratory distress,  hypotension EXAM: PORTABLE CHEST 1 VIEW COMPARISON:  10/20/ 17 FINDINGS: Cardiomegaly is noted. There is NG tube coiled within stomach with tip in proximal stomach. Elevation of the right hemidiaphragm again noted. Persistent streaky left base retrocardiac atelectasis or infiltrate. IMPRESSION: There is NG tube coiled within stomach with tip in proximal stomach. Elevation of the right hemidiaphragm again noted. Persistent streaky left base retrocardiac atelectasis or infiltrate. Electronically Signed   By: Natasha Mead M.D.   On: 01/25/2016 10:23   Dg Abd Acute W/chest  Result Date: 08-Feb-2016 CLINICAL DATA:  75 year old male with abdominal distention. Initial encounter. EXAM: DG ABDOMEN ACUTE W/ 1V CHEST COMPARISON:  CT Abdomen and Pelvis 06/24/2014 and earlier. FINDINGS: Chronic cardiomegaly appears not significantly changed since 2011. However, there is abnormal increased left hilar and retrocardiac density. No pneumothorax  or pleural effusion. Crowding of lung markings elsewhere. No edema suspected. Large body habitus. No pneumoperitoneum. Supine and left-side-down lateral decubitus views of the abdomen are provided. Mild to moderate gaseous distension of the stomach and throughout the large bowel. Several gas-filled but nondilated small bowel  loops are noted. Paucity of gas in the rectum. Furthermore there is a hazy gray opacity throughout the abdomen. This can be seen with ascites. No acute or suspicious osseous lesion identified. IMPRESSION: 1. Abnormal increased left hilar and retrocardiac density in the chest. PA and lateral chest radiographs would be helpful when possible. Failing that, Chest CT (IV contrast preferred) would be recommended. 2. Wallace Cullens hazy opacity throughout the abdomen suggesting Ascites. 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 3. Gas distended stomach and large bowel. Paucity of gas in the distal sigmoid colon and rectum. Small bowel loops nondilated at this time. Differential considerations include ileus and distal colon obstruction. Note that the prostate was severely enlarged on the 2016 CT Abdomen and Pelvis. Electronically Signed   By: Odessa Fleming M.D.   On: 02/02/2016 12:03   Dg Abd Portable 1v  Result Date: 01/29/2016 CLINICAL DATA:  Abdominal distention for "a very long time" per pt. Pt would not give me any specifics on the length of time of the distention. EXAM: PORTABLE ABDOMEN - 1 VIEW COMPARISON:  01/25/2016 FINDINGS: There is increased bowel gas with less bowel distention than noted on the prior study. Rectal tube projects in the central pelvis. Previously seen nasogastric tube has been removed. IMPRESSION: 1. There is bowel distention consistent with a diffuse colonic adynamic ileus. This appears mildly decreased in severity when compared to the prior exam. Electronically Signed   By: Amie Portland M.D.   On: 01/29/2016 14:40   Dg Abd Portable 1v  Result Date: 01/25/2016 CLINICAL DATA:  Check nasogastric catheter placement EXAM: PORTABLE ABDOMEN - 1 VIEW COMPARISON:  01/24/2016 FINDINGS: Scattered large and small bowel gas is noted. The degree of gaseous distension of the colon is stable. A nasogastric catheter is now seen within the stomach. No free air is seen. IMPRESSION:  New nasogastric catheter within the stomach.  Stable colonic ileus. Electronically Signed   By: Alcide Clever M.D.   On: 01/25/2016 10:23   Dg Abd Portable 1v  Result Date: 01/24/2016 CLINICAL DATA:  Ileus EXAM: PORTABLE ABDOMEN - 1 VIEW COMPARISON:  01/25/2016 FINDINGS: Scattered large and small bowel gas is noted. Gas distension of the colon is again identified and stable from the prior exam. The overall appearance is similar to that seen on the previous day. No free air is noted. Foley catheter is noted. IMPRESSION: Changes consistent with a colonic ileus. Correlation with physical exam is recommended. No significant change from the previous day is seen. Electronically Signed   By: Alcide Clever M.D.   On: 01/24/2016 07:16    Assessment & Plan    SHOCK:  BP is improved.  Per CCM, and [facility]  ACUTE SYSTOLIC HF:  On IV Lasix.   Creat is improved.    Minus 4 liters since admit.  Yesterday negative 3 liters.   Continue IV diuresis.  He has anasarca and is to have paracentesis when his INR is down.   ATRIAL FIB:  Rate controlled.  No anticoagulation with GI bleeding.    ELEVATED TROPONIN:    Mild elevation in the face of shock ARF.  Not a diagnostic trend.  No invasive evaluation is indicated.  PROLONGED QTC:    Follow  Signed, Rollene RotundaJames Alexandrina Fiorini, MD  01/31/2016, 7:35 AM

## 2016-01-31 NOTE — Plan of Care (Signed)
Problem: Health Behavior/Discharge Planning: Goal: Ability to manage health-related needs will improve Outcome: Progressing Case management involved to assist in managing health needs, but patient not independent at present.  Problem: Skin Integrity: Goal: Risk for impaired skin integrity will decrease Outcome: Not Progressing Poor skin integrity due to significant third spacing and impaired labs.

## 2016-01-31 NOTE — Progress Notes (Signed)
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 He stated he was too weak to get up and had not taken any po nor urinated or defecated during that time. On arrival he was alert and oriented, dry mucous membranes and had anasarca with leg swelling R>L with evidence of cellulitis on the right leg. Abdomen was tympanitic and distended. Rectal temperature was 96.65F, glucose 26mg /dl, hypotensive and tachycardic saturating 90% on room air, 99% with 2L O2. Labs were significant for creatinine 3.32, BUN 86, bicarbonate 21, CK 528, troponin 0.36, BNP 2,155. Abdomen with chest x-ray showed abnormal increased left hilar and retrocardiac density in the chest, findings suggestive of ascites and colonic ileus. INR on arrival was supratherapeutic at 6, jumped to 10 10/21. Oral vitamin K given during the day. CT imaging showed cirrhosis (no known history of this) and large volume ascites.   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 NG tube placed, boluses, octreotide and protonix gtt started, IV vitamin K was given. BP remained low despite NS, likely third spacing IV fluid. Pt is DNR and a Jehovah's witness, refusing blood transfusions. Levophed started. After discussion with patient, verbal consent for "blood substitutes" including albumin and Kcentra was obtained, and these were given. INR has normalized and no further signs of bleeding. Levophed was weaned off 10/23.  Assessment & Plan:   Principal Problem:   Sepsis (HCC) Active Problems:   AKI (acute kidney injury) (HCC)   Urinary retention   Prolonged Q-T interval on ECG   Atrial fibrillation (HCC)   Chronic combined  systolic and diastolic CHF (congestive heart failure) (HCC)   Cellulitis and abscess of right lower extremity   Pressure injury of skin   Rhabdomyolysis   GI bleeding   Coagulopathy (HCC)   Lactic acidosis   Elevated troponin   Shock (HCC)   Hypoalbuminemia   RBBB   Bacteremia   Acute respiratory failure (HCC)   Abdominal distention   Right heart failure   Ascites   Hypovolemic shock due to hematemesis with acute blood loss anemia:   -Suspect auto anticoagulation secondary to liver disease -Gastroenterology is following -Hemoglobin thus far stable. Will repeat CBC in the morning -Have ordered IV vitamin K (see below) -Cont PPI -Will repeat CBC in AM. Follow INR closely -Of note, pt's INR had responded to Kcentra  Severe sepsis from Morganella bacteremia/RLE cellulitis:  -Lactic acid has normalized -Patient is noted to have 1/2 morbanella species and blood cultures sensitive to Rocephin.  -For the time being, patient is continued on Rocephin  Acute renal failure/anasarca ?hepatorenal syndrome:  -Presenting creatinine of 3.32, baseline creatinine is uncertain -Suspect worsened renal disease secondary to presenting rhabdomyolysis versus acute heart failure -Cr continues to improve, currently 1.95 -Repeat basic metabolic panel in the morning  Chronic systolic heart failure and right heart failure:  -Patient noted to have EF of 45-50% with mild LVH -Cardiology is following, appreciate input -Patient is continued on IV Lasix per cardiology recommendations -Still volume overloaded  Atrial fibrillation:  -Heart rate mainly controlled, at times into the low 100s, although patient does appear uncomfortable -Patient remains off beta-blockade at this time secondary to soft blood pressures -Continue per cardiology -  Anticoagulation Kocher indicated secondary to concerns of acute blood loss anemia as well as supratherapeutic INR  Acute respiratory failure:  -Patient remains  on 5 L nasal cannula -Recent chest x-ray demonstrated cardiomegaly with vascular congestion and improving interstitial edema -Continue to wean O2 as tolerated -Would benefit from paracentesis (see below)  Colonic ileus:  -Procedure noted on CT abdomen pelvis -Patient is tolerating a diet -Persistent adynamic ileus is noted on follow-up abdominal x-ray that does appear less severe  Ascites: -Large ascites has been noted on multiple imaging studies -Patient supratherapeutic INR precludes attempt at paracentesis -Discussed case with interventional radiology. Target INR is around 2.5 before paracentesis can be attempted -See note from this morning: Patient was alert and oriented with full insight to his medical care. Option for FFP was discussed with patient multiple times this morning. Patient is aware that FFP is derived from blood products and was ultimately willing to proceed. Later in the afternoon, patient's church members arrived to visit. Shortly thereafter, patient declined FFP. -Will repeat INR in the morning  Mild rhabdomyolysis:  -Most recent CK within normal limits -Appears to have resolved  Severe deconditioning:  -Ultimately, plans for skilled nursing facility when discharged per therapy recommendations  Prolonged QTc interval:  -Presenting QTC of 537. -We'll continue with telemetry monitoring  Urinary retention:  -Patient noted to have enlarged prostate on CT scan of abdomen and pelvis -For now, patient is continued with Foley catheter for urinary retention as well as for close monitoring of I's and O's  Coagulopathy -INR continues to increase areas currently is 4.5 despite daily vitamin K -No signs of active bleeding at this time -Discussed with pharmacy, plan to transition to 10 mg IV vitamin K today -Repeat INR in the morning -See above: Patient initially agreed to FFP and was well aware that FFP is derived from blood products. Patient later declined -INR  previously noted to have improved with Kcentra x 1 -Discussed case with hematology on call (Dr. Shirline Frees). Hematology agrees with vitamin K administration. Also recommendation to check fibrinogen level which has been ordered and is pending.  DVT prophylaxis: SCDs Code Status: DO NOT RESUSCITATE Family Communication: Patient in room, family not at bedside Disposition Plan: Uncertain at this time  Consultants:   Gastroenterology  Cardiology  Pulmonary critical care  Discussed case with hematology (Dr. Shirline Frees)  Procedures:   Foley placement by Dr. [facility] on 2016/02/03  Right lower extremity Doppler ultrasound 2016/02/03 negative for DVT  Right upper extremity Doppler ultrasound on 2016/02/03 negative for SVT or DVT  Patient developed hematemesis on 01/25/2016, initiated Levothroid until 01/26/2016  EGD by gastroenterology on 01/29/2016  Antimicrobials: Anti-infectives    Start     Dose/Rate Route Frequency Ordered Stop   01/26/16 1000  cefTRIAXone (ROCEPHIN) 2 g in dextrose 5 % 50 mL IVPB     2 g 100 mL/hr over 30 Minutes Intravenous Every 24 hours 01/26/16 0849     01/25/16 1600  vancomycin (VANCOCIN) 1,500 mg in sodium chloride 0.9 % 500 mL IVPB  Status:  Discontinued     1,500 mg 250 mL/hr over 120 Minutes Intravenous Every 48 hours February 03, 2016 1450 01/24/16 1048   01/24/16 1600  vancomycin (VANCOCIN) 1,500 mg in sodium chloride 0.9 % 500 mL IVPB  Status:  Discontinued     1,500 mg 250 mL/hr over 120 Minutes Intravenous Every 48 hours 01/24/16 1048 01/26/16 0816   02/03/16 2200  piperacillin-tazobactam (ZOSYN) IVPB 2.25 g  Status:  Discontinued  2.25 g 100 mL/hr over 30 Minutes Intravenous Every 6 hours 01/17/2016 1450 01/26/16 0849   01/19/2016 1445  vancomycin (VANCOCIN) 1,500 mg in sodium chloride 0.9 % 500 mL IVPB  Status:  Discontinued     1,500 mg 250 mL/hr over 120 Minutes Intravenous STAT 01/11/2016 1440 01/24/16 1048   01/10/2016 1430   piperacillin-tazobactam (ZOSYN) IVPB 3.375 g     3.375 g 100 mL/hr over 30 Minutes Intravenous  Once 01/30/2016 1420 02/01/2016 1508   01/15/2016 1430  vancomycin (VANCOCIN) IVPB 1000 mg/200 mL premix  Status:  Discontinued     1,000 mg 200 mL/hr over 60 Minutes Intravenous  Once 01/27/2016 1420 01/20/2016 1608      Subjective: Complains of abdominal distention and discomfort  Objective: Vitals:   01/31/16 1400 01/31/16 1500 01/31/16 1600 01/31/16 1700  BP: 101/62 (!) 92/52 97/61 107/62  Pulse: 97 100 84 (!) 101  Resp: 15 15 12 14   Temp: 97.4 F (36.3 C)     TempSrc: Oral     SpO2: 98% 92% 98% 92%  Weight:      Height:        Intake/Output Summary (Last 24 hours) at 01/31/16 1800 Last data filed at 01/31/16 1700  Gross per 24 hour  Intake              204 ml  Output             2800 ml  Net            -2596 ml   Filed Weights   01/29/16 0430 01/30/16 0500 01/31/16 0752  Weight: 131.5 kg (289 lb 14.5 oz) 129.6 kg (285 lb 11.5 oz) 125.3 kg (276 lb 3.8 oz)    Examination:  General exam:Patient awake, appears uncomfortable, conversant Respiratory system: Normal chest rise, no active wheezing Cardiovascular system: Regular rate, S1-S2 Gastrointestinal system: Distended, generally tender, distant bowel sounds Central nervous system: No seizures, sensation intact Extremities: Perfused, no clubbing Skin: No notable skin lesions seen, normal skin turgor Psychiatry: Normal mood, no visual hallucinations  Data Reviewed: I have personally reviewed following labs and imaging studies  CBC:  Recent Labs Lab 01/27/16 0600 01/11/2016 0348 01/29/16 0435 01/30/16 0500 01/31/16 0401  WBC 14.7* 11.7* 9.6 9.6 9.7  NEUTROABS 13.2*  --   --   --   --   HGB 9.4* 10.0* 9.9* 9.8* 10.0*  HCT 27.9* 30.1* 29.5* 29.8* 30.3*  MCV 93.3 95.3 95.5 95.8 95.9  PLT 95* 103* 110* 133* 159   Basic Metabolic Panel:  Recent Labs Lab 01/26/16 1500 01/27/16 0600 02/01/2016 0348 01/29/16 0435  01/30/16 0500 01/31/16 0401  NA 147* 147* 147* 150* 149* 149*  K 3.3* 3.4* 3.3* 3.4* 3.4* 3.3*  CL 112* 110 114* 117* 115* 114*  CO2 25 25 26 25 28 29   GLUCOSE 148* 129* 122* 116* 125* 121*  BUN 96* 97* 96* 92* 81* 75*  CREATININE 3.01* 2.95* 2.78* 2.49* 2.33* 1.95*  CALCIUM 8.6* 8.5* 8.5* 8.6* 8.6* 8.7*  MG 2.2  --  2.1  --   --   --    GFR: Estimated Creatinine Clearance: 44.1 mL/min (by C-G formula based on SCr of 1.95 mg/dL (H)). Liver Function Tests:  Recent Labs Lab 01/26/16 0300 01/26/16 1500 01/27/16 0600 01/24/2016 0348 01/29/16 0435  AST 21 21 20 21 22   ALT 20 20 18 17 17   ALKPHOS 55 51 50 49 49  BILITOT 2.6* 2.4* 2.2* 2.3* 2.7*  PROT 6.4*  6.0* 6.1* 6.2* 6.2*  ALBUMIN 2.6* 2.4* 2.5* 2.5* 2.4*   No results for input(s): LIPASE, AMYLASE in the last 168 hours. No results for input(s): AMMONIA in the last 168 hours. Coagulation Profile:  Recent Labs Lab 01/27/16 0600 01/21/2016 0348 01/29/16 0435 01/30/16 0500 01/31/16 0401  INR 2.23 2.80 3.70 4.45* 4.53*   Cardiac Enzymes:  Recent Labs Lab 01/25/16 0313 01/26/16 1500 01/30/16 2201 01/31/16 0401 01/31/16 1034  CKTOTAL 225 50  --   --   --   TROPONINI  --   --  0.14* 0.13* 0.12*   BNP (last 3 results) No results for input(s): PROBNP in the last 8760 hours. HbA1C: No results for input(s): HGBA1C in the last 72 hours. CBG:  Recent Labs Lab 01/30/16 2338 01/31/16 0403 01/31/16 0803 01/31/16 1216 01/31/16 1643  GLUCAP 111* 100* 234* 105* 156*   Lipid Profile: No results for input(s): CHOL, HDL, LDLCALC, TRIG, CHOLHDL, LDLDIRECT in the last 72 hours. Thyroid Function Tests: No results for input(s): TSH, T4TOTAL, FREET4, T3FREE, THYROIDAB in the last 72 hours. Anemia Panel: No results for input(s): VITAMINB12, FOLATE, FERRITIN, TIBC, IRON, RETICCTPCT in the last 72 hours. Sepsis Labs:  Recent Labs Lab 01/26/16 0300  LATICACIDVEN 1.0    Recent Results (from the past 240 hour(s))   Culture, blood (routine x 2)     Status: None   Collection Time: Feb 14, 2016 12:48 PM  Result Value Ref Range Status   Specimen Description BLOOD LEFT ANTECUBITAL  Final   Special Requests BOTTLES DRAWN AEROBIC AND ANAEROBIC 5 CC EA  Final   Culture   Final    NO GROWTH 5 DAYS Performed at [facility]    Report Status 01/29/2016 FINAL  Final  Culture, blood (routine x 2)     Status: Abnormal   Collection Time: February 14, 2016 12:48 PM  Result Value Ref Range Status   Specimen Description BLOOD LEFT ANTECUBITAL  Final   Special Requests BOTTLES DRAWN AEROBIC AND ANAEROBIC 5 CC EA  Final   Culture  Setup Time   Final    GRAM NEGATIVE RODS ANAEROBIC BOTTLE ONLY CRITICAL RESULT CALLED TO, READ BACK BY AND VERIFIED WITH: Haze Boyden, PHARM AT 0813 ON [id number] BY Lucienne Capers Performed at [facility]    Culture [facility] MORGANII (A)  Final   Report Status 01/26/2016 FINAL  Final   Organism ID, Bacteria MORGANELLA MORGANII  Final      Susceptibility   Morganella morganii - MIC*    AMPICILLIN >=32 RESISTANT Resistant     CEFAZOLIN >=64 RESISTANT Resistant     CEFEPIME <=1 SENSITIVE Sensitive     CEFTAZIDIME <=1 SENSITIVE Sensitive     CEFTRIAXONE <=1 SENSITIVE Sensitive     CIPROFLOXACIN <=0.25 SENSITIVE Sensitive     GENTAMICIN <=1 SENSITIVE Sensitive     IMIPENEM 4 SENSITIVE Sensitive     TRIMETH/SULFA <=20 SENSITIVE Sensitive     AMPICILLIN/SULBACTAM >=32 RESISTANT Resistant     PIP/TAZO <=4 SENSITIVE Sensitive     * MORGANELLA MORGANII  Blood Culture ID Panel (Reflexed)     Status: None   Collection Time: Feb 14, 2016 12:48 PM  Result Value Ref Range Status   Enterococcus species NOT DETECTED NOT DETECTED Final   Listeria monocytogenes NOT DETECTED NOT DETECTED Final   Staphylococcus species NOT DETECTED NOT DETECTED Final   Staphylococcus aureus NOT DETECTED NOT DETECTED Final   Streptococcus species NOT DETECTED NOT DETECTED Final   Streptococcus agalactiae NOT  DETECTED NOT DETECTED  Final   Streptococcus pneumoniae NOT DETECTED NOT DETECTED Final   Streptococcus pyogenes NOT DETECTED NOT DETECTED Final   Acinetobacter baumannii NOT DETECTED NOT DETECTED Final   Enterobacteriaceae species NOT DETECTED NOT DETECTED Final   Enterobacter cloacae complex NOT DETECTED NOT DETECTED Final   Escherichia coli NOT DETECTED NOT DETECTED Final   Klebsiella oxytoca NOT DETECTED NOT DETECTED Final   Klebsiella pneumoniae NOT DETECTED NOT DETECTED Final   Proteus species NOT DETECTED NOT DETECTED Final   Serratia marcescens NOT DETECTED NOT DETECTED Final   Haemophilus influenzae NOT DETECTED NOT DETECTED Final   Neisseria meningitidis NOT DETECTED NOT DETECTED Final   Pseudomonas aeruginosa NOT DETECTED NOT DETECTED Final   Candida albicans NOT DETECTED NOT DETECTED Final   Candida glabrata NOT DETECTED NOT DETECTED Final   Candida krusei NOT DETECTED NOT DETECTED Final   Candida parapsilosis NOT DETECTED NOT DETECTED Final   Candida tropicalis NOT DETECTED NOT DETECTED Final    Comment: Performed at [facility]  Culture, Urine     Status: None   Collection Time: 01/11/2016  5:16 PM  Result Value Ref Range Status   Specimen Description URINE, RANDOM  Final   Special Requests NONE  Final   Culture NO GROWTH Performed at [facility]   Final   Report Status 01/24/2016 FINAL  Final  MRSA PCR Screening     Status: None   Collection Time: 01/05/2016  5:21 PM  Result Value Ref Range Status   MRSA by PCR NEGATIVE NEGATIVE Final    Comment:        The GeneXpert MRSA Assay (FDA approved for NASAL specimens only), is one component of a comprehensive MRSA colonization surveillance program. It is not intended to diagnose MRSA infection nor to guide or monitor treatment for MRSA infections.      Radiology Studies: US Abdomen Limited  Result Date: 01/30/2016 CLINICAL DATA:  Evaluate for ascites. EXAM: LIMITED ABDOMEN ULTRASOUND FOR ASCITES  TECHNIQUE: Limited ultrasound survey for ascites was performed in all four abdominal quadrants. COMPARISON:  CT 01/24/2016 FINDINGS: Large amount of ascites in all 4 quadrants of the abdomen. Fluid appears to be simple. IMPRESSION: Large amount of ascites. Electronically Signed   By: Richarda Overlie M.D.   On: 01/30/2016 15:49   Dg Chest Port 1 View  Result Date: 01/30/2016 CLINICAL DATA:  Shortness of breath, chest congestion. Fluid retention. History of CHF and hypertension EXAM: PORTABLE CHEST 1 VIEW COMPARISON:  01/25/2016 FINDINGS: Mild cardiomegaly with vascular congestion. Improving interstitial prominence within the lungs, likely improving edema. No confluent opacities or effusions. IMPRESSION: Cardiomegaly with vascular congestion. Improving interstitial edema. Electronically Signed   By: Charlett Nose M.D.   On: 01/30/2016 08:54    Scheduled Meds: . atorvastatin  40 mg Oral q1800  . cefTRIAXone (ROCEPHIN)  IV  2 g Intravenous Q24H  . furosemide  20 mg Intravenous Once  . furosemide  40 mg Intravenous BID  . lidocaine  1 application Urethral Once  . pantoprazole  40 mg Oral BID AC  . [START ON 02/01/2016] phytonadione  5 mg Oral Daily  . potassium chloride  40 mEq Oral BID  . sodium chloride flush  10 mL Intravenous Q8H  . sucralfate  1 g Oral TID WC & HS   Continuous Infusions:     LOS: 8 days   CHIU, Scheryl Marten, MD [facility] Pager [phone]  If 7PM-7AM, please contact night-coverage [email or web address] Password [email or web address] 01/31/2016, 6:00 PM

## 2016-01-31 NOTE — Treatment Plan (Signed)
Patient seen. Full note will follow. Patient remains grossly volume overloaded with distended abd. INR up to 4.5 today with large ascites on US. D/w pharmacy. Have ordered 10mg  IV vit K. Patient alert and oriented, has insight to care. Patient aware that paracentesis cannot be done unless INR is much lower. FFP discussed with patient and he is aware it is from blood product. Despite known religous beliefs, patient agrees to FFP and states, "Just get my blood thicker." Will order FFP.

## 2016-01-31 NOTE — Progress Notes (Signed)
CRITICAL VALUE ALERT  Critical value received: INR 4.53 Date of notification:  01/31/16  Time of notification:  0524  Critical value read back:Yes.    Nurse who received alert:  JRM  MD notified (1st page):  M.Lynch (mid-level)  Time of first page:  [phone]

## 2016-01-31 NOTE — Progress Notes (Signed)
Subjective: Abdominal tightness with some shortness of breath.  Objective: Vital signs in last 24 hours: Temp:  [97.1 F (36.2 C)-98.4 F (36.9 C)] 97.1 F (36.2 C) (10/28 0800) Pulse Rate:  [38-115] 85 (10/28 1000) Resp:  [10-19] 14 (10/28 1000) BP: (83-120)/(53-79) 115/56 (10/28 1000) SpO2:  [85 %-100 %] 100 % (10/28 1000) Weight:  [125.3 kg (276 lb 3.8 oz)] 125.3 kg (276 lb 3.8 oz) (10/28 0752) Weight change:  Last BM Date: 01/30/16  PE: GEN:  Generalized anasarca, awake, NAD ABD:  Large ascites, tight; non tender.  Lab Results: CBC    Component Value Date/Time   WBC 9.7 01/31/2016 0401   RBC 3.16 (L) 01/31/2016 0401   HGB 10.0 (L) 01/31/2016 0401   HCT 30.3 (L) 01/31/2016 0401   PLT 159 01/31/2016 0401   MCV 95.9 01/31/2016 0401   MCH 31.6 01/31/2016 0401   MCHC 33.0 01/31/2016 0401   RDW 17.3 (H) 01/31/2016 0401   LYMPHSABS 0.3 (L) 01/27/2016 0600   MONOABS 1.2 (H) 01/27/2016 0600   EOSABS 0.0 01/27/2016 0600   BASOSABS 0.0 01/27/2016 0600   CMP     Component Value Date/Time   NA 149 (H) 01/31/2016 0401   K 3.3 (L) 01/31/2016 0401   CL 114 (H) 01/31/2016 0401   CO2 29 01/31/2016 0401   GLUCOSE 121 (H) 01/31/2016 0401   BUN 75 (H) 01/31/2016 0401   CREATININE 1.95 (H) 01/31/2016 0401   CALCIUM 8.7 (L) 01/31/2016 0401   PROT 6.2 (L) 01/29/2016 0435   ALBUMIN 2.4 (L) 01/29/2016 0435   AST 22 01/29/2016 0435   ALT 17 01/29/2016 0435   ALKPHOS 49 01/29/2016 0435   BILITOT 2.7 (H) 01/29/2016 0435   GFRNONAA 32 (L) 01/31/2016 0401   GFRAA 37 (L) 01/31/2016 0401   INR 4.5  Assessment:  1.  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 2.  Ascites, symptomatic. 3.  Coagulopathy with thrombocytopenia as well, likely from #1 above. 4.  Gastrointestinal bleeding, resolved. 5.  Multiple cardiac comorbidities.  Plan:  1.  Diagnostic (cell count with differential, culture, albumin, total  protein, cytology) and therapeutic (5-7 liters, with 50 grams of albumin given) paracentesis, once radiology feels comfortable with INR level (patient agrees to accept FFP). 2.  Continue antibiotics (GI bleed in setting of ascites and suspected cirrhosis). 3.  Full liquid diet for now. 4.  [facility] will follow.   Vincent Gibson,Vincent Gibson 01/31/2016, 1:09 PM   Pager [phone] If no answer or after 5 PM call [phone]

## 2016-02-01 ENCOUNTER — Inpatient Hospital Stay (HOSPITAL_COMMUNITY): Payer: Medicare Other

## 2016-02-01 DIAGNOSIS — R1312 Dysphagia, oropharyngeal phase: Secondary | ICD-10-CM

## 2016-02-01 DIAGNOSIS — M6282 Rhabdomyolysis: Secondary | ICD-10-CM

## 2016-02-01 DIAGNOSIS — R601 Generalized edema: Secondary | ICD-10-CM

## 2016-02-01 LAB — CBC
HCT: 29.7 % — ABNORMAL LOW (ref 39.0–52.0)
HEMOGLOBIN: 9.8 g/dL — AB (ref 13.0–17.0)
MCH: 31.8 pg (ref 26.0–34.0)
MCHC: 33 g/dL (ref 30.0–36.0)
MCV: 96.4 fL (ref 78.0–100.0)
PLATELETS: 181 10*3/uL (ref 150–400)
RBC: 3.08 MIL/uL — AB (ref 4.22–5.81)
RDW: 17.6 % — ABNORMAL HIGH (ref 11.5–15.5)
WBC: 11.6 10*3/uL — AB (ref 4.0–10.5)

## 2016-02-01 LAB — BASIC METABOLIC PANEL
ANION GAP: 7 (ref 5–15)
BUN: 66 mg/dL — ABNORMAL HIGH (ref 6–20)
CALCIUM: 8.6 mg/dL — AB (ref 8.9–10.3)
CO2: 29 mmol/L (ref 22–32)
CREATININE: 1.92 mg/dL — AB (ref 0.61–1.24)
Chloride: 113 mmol/L — ABNORMAL HIGH (ref 101–111)
GFR, EST AFRICAN AMERICAN: 38 mL/min — AB (ref >60)
GFR, EST NON AFRICAN AMERICAN: 32 mL/min — AB (ref >60)
Glucose, Bld: 151 mg/dL — ABNORMAL HIGH (ref 65–99)
Potassium: 3.4 mmol/L — ABNORMAL LOW (ref 3.5–5.1)
SODIUM: 149 mmol/L — AB (ref 135–145)

## 2016-02-01 LAB — BODY FLUID CELL COUNT WITH DIFFERENTIAL
LYMPHS FL: 1 %
MONOCYTE-MACROPHAGE-SEROUS FLUID: 32 % — AB (ref 50–90)
Neutrophil Count, Fluid: 67 % — ABNORMAL HIGH (ref 0–25)
WBC FLUID: 449 uL (ref 0–1000)

## 2016-02-01 LAB — GLUCOSE, CAPILLARY
GLUCOSE-CAPILLARY: 116 mg/dL — AB (ref 65–99)
GLUCOSE-CAPILLARY: 106 mg/dL — AB (ref 65–99)
GLUCOSE-CAPILLARY: 126 mg/dL — AB (ref 65–99)
Glucose-Capillary: 124 mg/dL — ABNORMAL HIGH (ref 65–99)
Glucose-Capillary: 94 mg/dL (ref 65–99)

## 2016-02-01 LAB — PROTIME-INR
INR: 1.63
PROTHROMBIN TIME: 19.5 s — AB (ref 11.4–15.2)

## 2016-02-01 LAB — GRAM STAIN

## 2016-02-01 LAB — ALBUMIN, FLUID (OTHER): Albumin, Fluid: 1.2 g/dL

## 2016-02-01 LAB — PROTEIN, BODY FLUID

## 2016-02-01 MED ORDER — ALBUMIN HUMAN 25 % IV SOLN
50.0000 g | Freq: Once | INTRAVENOUS | Status: AC
  Administered 2016-02-01: 50 g via INTRAVENOUS
  Filled 2016-02-01: qty 200

## 2016-02-01 MED ORDER — FUROSEMIDE 10 MG/ML IJ SOLN
60.0000 mg | Freq: Two times a day (BID) | INTRAMUSCULAR | Status: DC
  Administered 2016-02-01 – 2016-02-04 (×8): 60 mg via INTRAVENOUS
  Filled 2016-02-01 (×9): qty 6

## 2016-02-01 NOTE — Procedures (Signed)
Ultrasound-guided diagnostic and therapeutic paracentesis performed yielding 7 liters (maximum ordered by GI team) of slightly hazy, amber colored fluid. No immediate complications. A portion of the fluid was sent to the lab for preordered studies. The patient will receive IV albumin infusion post procedure.

## 2016-02-01 NOTE — Progress Notes (Signed)
Subjective: Abdomen feels tight; causing some subjective breathing troubles. No bleeding.  Objective: Vital signs in last 24 hours: Temp:  [97.4 F (36.3 C)-98.4 F (36.9 C)] 97.5 F (36.4 C) (10/29 0800) Pulse Rate:  [28-112] 103 (10/29 0900) Resp:  [9-20] 20 (10/29 0900) BP: (92-118)/(51-77) 106/51 (10/29 0900) SpO2:  [87 %-100 %] 100 % (10/29 0900) Weight:  [129.6 kg (285 lb 11.5 oz)] 129.6 kg (285 lb 11.5 oz) (10/29 1000) Weight change:  Last BM Date: 01/30/16  PE: GEN:  Overweight, anasarca ABD:  Moderately severe ascites, tense, generalized mild tenderness EXT:  Edematous hands and feet  Lab Results: CBC    Component Value Date/Time   WBC 11.6 (H) 02/01/2016 0455   RBC 3.08 (L) 02/01/2016 0455   HGB 9.8 (L) 02/01/2016 0455   HCT 29.7 (L) 02/01/2016 0455   PLT 181 02/01/2016 0455   MCV 96.4 02/01/2016 0455   MCH 31.8 02/01/2016 0455   MCHC 33.0 02/01/2016 0455   RDW 17.6 (H) 02/01/2016 0455   LYMPHSABS 0.3 (L) 01/27/2016 0600   MONOABS 1.2 (H) 01/27/2016 0600   EOSABS 0.0 01/27/2016 0600   BASOSABS 0.0 01/27/2016 0600   CMP     Component Value Date/Time   NA 149 (H) 02/01/2016 0455   K 3.4 (L) 02/01/2016 0455   CL 113 (H) 02/01/2016 0455   CO2 29 02/01/2016 0455   GLUCOSE 151 (H) 02/01/2016 0455   BUN 66 (H) 02/01/2016 0455   CREATININE 1.92 (H) 02/01/2016 0455   CALCIUM 8.6 (L) 02/01/2016 0455   PROT 6.2 (L) 01/29/2016 0435   ALBUMIN 2.4 (L) 01/29/2016 0435   AST 22 01/29/2016 0435   ALT 17 01/29/2016 0435   ALKPHOS 49 01/29/2016 0435   BILITOT 2.7 (H) 01/29/2016 0435   GFRNONAA 32 (L) 02/01/2016 0455   GFRAA 38 (L) 02/01/2016 0455   INR 1.6  Assessment:  1.  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 2.  Ascites, symptomatic. 3.  Coagulopathy with thrombocytopenia as well, likely from #1 above.  INR has improved with high-dose vitamin K (patient apparently refused, or at least  did not receive FFP, per my chart review). 4.  Gastrointestinal bleeding, resolved. 5.  Renal insufficiency, improving. 6.  Multiple cardiac comorbidities.  Plan:  1.  Paracentesis apparently planned for today.  Would do this study both for diagnostic (cell count with differential, culture, albumin, total protein, cytology) and therapeutic (5-7 liters, with 50 grams of albumin given) purposes. 2.  Continue antibiotics, needs total of 10-days of therapy for GI bleeding in setting of ascites; can transition to oral ciprofloxacin to complete the regimen if necessary. 3.  [facility] will follow.  Freddy JakschOUTLAW,Melody Savidge M 02/01/2016, 10:27 AM   Pager [phone] If no answer or after 5 PM call [phone]

## 2016-02-01 NOTE — Progress Notes (Signed)
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 He stated he was too weak to get up and had not taken any po nor urinated or defecated during that time. On arrival he was alert and oriented, dry mucous membranes and had anasarca with leg swelling R>L with evidence of cellulitis on the right leg. Abdomen was tympanitic and distended. Rectal temperature was 96.39F, glucose 26mg /dl, hypotensive and tachycardic saturating 90% on room air, 99% with 2L O2. Labs were significant for creatinine 3.32, BUN 86, bicarbonate 21, CK 528, troponin 0.36, BNP 2,155. Abdomen with chest x-ray showed abnormal increased left hilar and retrocardiac density in the chest, findings suggestive of ascites and colonic ileus. INR on arrival was supratherapeutic at 6, jumped to 10 10/21. Oral vitamin K given during the day. CT imaging showed cirrhosis (no known history of this) and large volume ascites.   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 NG tube placed, boluses, octreotide and protonix gtt started, IV vitamin K was given. BP remained low despite NS, likely third spacing IV fluid. Pt is DNR and a Jehovah's witness, refusing blood transfusions. Levophed started. After discussion with patient, verbal consent for "blood substitutes" including albumin and Kcentra was obtained, and these were given. INR has normalized and no further signs of bleeding. Levophed was weaned off 10/23.  Assessment & Plan:   Principal Problem:   Sepsis (HCC) Active Problems:   AKI (acute kidney injury) (HCC)   Urinary retention   Prolonged Q-T interval on ECG   Atrial fibrillation (HCC)   Chronic combined  systolic and diastolic CHF (congestive heart failure) (HCC)   Cellulitis and abscess of right lower extremity   Pressure injury of skin   Rhabdomyolysis   GI bleeding   Coagulopathy (HCC)   Lactic acidosis   Elevated troponin   Shock (HCC)   Hypoalbuminemia   RBBB   Bacteremia   Acute respiratory failure (HCC)   Abdominal distention   Right heart failure   Ascites   Hypovolemic shock due to hematemesis with acute blood loss anemia:   -Suspected anticoagulation due to liver disease -Gastric urology following. Recommendations noted -Hemoglobin remained stable. Labs reviewed -A she continued on PPI  Severe sepsis from Morganella bacteremia/RLE cellulitis:  -Lactic acid had returned to normal -Patient is noted to have 1/2 morbanella species and blood cultures sensitive to Rocephin.  -GI recommendations for total 10 days of antibiotics, can transition to by mouth ciprofloxacin to complete regimen  Acute renal failure/anasarca ?hepatorenal syndrome:  -Presenting creatinine of 3.32, baseline creatinine is uncertain -Likely renal failure secondary to rhabdomyolysis versus acute heart failure -Creatinine improving with diuresis, creatinine currently 1.92 -Repeat comprehensive metabolic panel in morning  Chronic systolic heart failure and right heart failure:  -Patient noted to have EF of 45-50% with mild LVH -Cardiology following, continue diuresis -Remains Lyme overloaded  Atrial fibrillation:  -Heart rate overall stable -Cardiology following -Beta blocker remains on hold secondary to soft blood pressures -Anticoagulation remains on hold  Acute respiratory failure:  -Currently on 5 L nasal cannula -Recent chest x-ray with vascular congestion. Improving interstitial edema. -Continue to wean O2  Colonic ileus:  -She is to tolerate diet -Recent CT pelvis  with adynamic ileus, again noted on follow-up x-ray of the abdomen albeit with less severe  findings  Ascites: -Large ascites noted on multiple recent imaging studies -Patient had issues with supratherapeutic INR which precluded paracentesis -INR successfully reversed with higher dose of IV vitamin K and patient is now status post paracentesis yielding 7 L of ascitic fluid. -Follow-up on fluid analysis  Mild rhabdomyolysis:  -CK levels have returned to normal limits -Rhabdomyolysis appears to resolved  Severe deconditioning:  -Therapy recommendations for skilled nursing facility at time of discharge  Prolonged QTc interval:  -Presenting QTC of 537. -Plan to continue telemetry  Urinary retention:  -Enlarged prostate noted on CT scan abdomen pelvis -Continue Foley catheter to assess volume management while on diuretics  Coagulopathy -INR continued had steadily risen despite daily vitamin K -Patient was given high-dose IV vitamin K which successfully reversed patient's coagulopathy -Had discussed case with hematologist on call (Dr. Shirline Frees), who agrees with vitamin K supplementation as needed. -Fibrinogen reviewed and is within normal limits.  DVT prophylaxis: SCDs Code Status: DO NOT RESUSCITATE Family Communication: Patient in room, family not at bedside Disposition Plan: Uncertain at this time  Consultants:   Gastroenterology  Cardiology  Pulmonary critical care  Discussed case with hematology (Dr. Shirline Frees)  Procedures:   Foley placement by Dr. [facility]Diarm[facility] on 01/18/2016  Right lower extremity Doppler ultrasound 01/10/2016 negative for DVT  Right upper extremity Doppler ultrasound on 02/01/2016 negative for SVT or DVT  Patient developed hematemesis on 01/25/2016, initiated Levothroid until 01/26/2016  EGD by gastroenterology on 02/02/2016  Antimicrobials: Anti-infectives    Start     Dose/Rate Route Frequency Ordered Stop   01/26/16 1000  cefTRIAXone (ROCEPHIN) 2 g in dextrose 5 % 50 mL IVPB     2 g 100 mL/hr over 30 Minutes  Intravenous Every 24 hours 01/26/16 0849     01/25/16 1600  vancomycin (VANCOCIN) 1,500 mg in sodium chloride 0.9 % 500 mL IVPB  Status:  Discontinued     1,500 mg 250 mL/hr over 120 Minutes Intravenous Every 48 hours 01/26/2016 1450 01/24/16 1048   01/24/16 1600  vancomycin (VANCOCIN) 1,500 mg in sodium chloride 0.9 % 500 mL IVPB  Status:  Discontinued     1,500 mg 250 mL/hr over 120 Minutes Intravenous Every 48 hours 01/24/16 1048 01/26/16 0816   01/04/2016 2200  piperacillin-tazobactam (ZOSYN) IVPB 2.25 g  Status:  Discontinued     2.25 g 100 mL/hr over 30 Minutes Intravenous Every 6 hours 01/17/2016 1450 01/26/16 0849   01/22/2016 1445  vancomycin (VANCOCIN) 1,500 mg in sodium chloride 0.9 % 500 mL IVPB  Status:  Discontinued     1,500 mg 250 mL/hr over 120 Minutes Intravenous STAT 01/18/2016 1440 01/24/16 1048   01/22/2016 1430  piperacillin-tazobactam (ZOSYN) IVPB 3.375 g     3.375 g 100 mL/hr over 30 Minutes Intravenous  Once 01/22/2016 1420 01/13/2016 1508   02/02/2016 1430  vancomycin (VANCOCIN) IVPB 1000 mg/200 mL premix  Status:  Discontinued     1,000 mg 200 mL/hr over 60 Minutes Intravenous  Once 01/22/2016 1420 01/09/2016 1608      Subjective: Still complains of abdominal discomfort and tightness  Objective: Vitals:   02/01/16 1329 02/01/16 1333 02/01/16 1336 02/01/16 1340  BP: 126/61 (!) 102/59 (!) 101/48 (!) 116/52  Pulse: (!) 44 (!) 44  78  Resp: 12 14 14 12   Temp:      TempSrc:      SpO2: 93% 100%  100%  Weight:      Height:        Intake/Output Summary (Last 24 hours) at 02/01/16 1542 Last data filed at 02/01/16 1016  Gross per 24 hour  Intake              300 ml  Output             2025 ml  Net            -1725 ml   Filed Weights   01/30/16 0500 01/31/16 0752 02/01/16 1000  Weight: 129.6 kg (285 lb 11.5 oz) 125.3 kg (276 lb 3.8 oz) 129.6 kg (285 lb 11.5 oz)    Examination:  General exam:Lying in bed, conversant, appears somewhat uncomfortable Respiratory system:  Normal respiratory effort, no wheezing Cardiovascular system: Regular rhythm, S1-S2 Gastrointestinal system: Distended, generally tender, distant bowel sounds Central nervous system: CN II through XII grossly intact, no tremors noted Extremities: No cyanosis, no joint deformities Skin: Normal skin turgor, no rashes Psychiatry: Normal affect, no auditory hallucinations  Data Reviewed: I have personally reviewed following labs and imaging studies  CBC:  Recent Labs Lab 01/27/16 0600 01/22/2016 0348 01/29/16 0435 01/30/16 0500 01/31/16 0401 02/01/16 0455  WBC 14.7* 11.7* 9.6 9.6 9.7 11.6*  NEUTROABS 13.2*  --   --   --   --   --   HGB 9.4* 10.0* 9.9* 9.8* 10.0* 9.8*  HCT 27.9* 30.1* 29.5* 29.8* 30.3* 29.7*  MCV 93.3 95.3 95.5 95.8 95.9 96.4  PLT 95* 103* 110* 133* 159 181   Basic Metabolic Panel:  Recent Labs Lab 01/26/16 1500  01/16/2016 0348 01/29/16 0435 01/30/16 0500 01/31/16 0401 02/01/16 0455  NA 147*  < > 147* 150* 149* 149* 149*  K 3.3*  < > 3.3* 3.4* 3.4* 3.3* 3.4*  CL 112*  < > 114* 117* 115* 114* 113*  CO2 25  < > 26 25 28 29 29   GLUCOSE 148*  < > 122* 116* 125* 121* 151*  BUN 96*  < > 96* 92* 81* 75* 66*  CREATININE 3.01*  < > 2.78* 2.49* 2.33* 1.95* 1.92*  CALCIUM 8.6*  < > 8.5* 8.6* 8.6* 8.7* 8.6*  MG 2.2  --  2.1  --   --   --   --   < > = values in this interval not displayed. GFR: Estimated Creatinine Clearance: 45.6 mL/min (by C-G formula based on SCr of 1.92 mg/dL (H)). Liver Function Tests:  Recent Labs Lab 01/26/16 0300 01/26/16 1500 01/27/16 0600 01/05/2016 0348 01/29/16 0435  AST 21 21 20 21 22   ALT 20 20 18 17 17   ALKPHOS 55 51 50 49 49  BILITOT 2.6* 2.4* 2.2* 2.3* 2.7*  PROT 6.4* 6.0* 6.1* 6.2* 6.2*  ALBUMIN 2.6* 2.4* 2.5* 2.5* 2.4*   No results for input(s): LIPASE, AMYLASE in the last 168 hours. No results for input(s): AMMONIA in the last 168 hours. Coagulation Profile:  Recent Labs Lab 01/24/2016 0348 01/29/16 0435  01/30/16 0500 01/31/16 0401 02/01/16 0455  INR 2.80 3.70 4.45* 4.53* 1.63   Cardiac Enzymes:  Recent Labs Lab 01/26/16 1500 01/30/16 2201 01/31/16 0401 01/31/16 1034  CKTOTAL 50  --   --   --   TROPONINI  --  0.14* 0.13* 0.12*   BNP (last 3 results) No results for input(s): PROBNP in the last 8760 hours. HbA1C: No results for input(s): HGBA1C in the last 72 hours. CBG:  Recent Labs Lab 01/31/16 2058 01/31/16 2305 02/01/16 0431  02/01/16 0807 02/01/16 1207  GLUCAP 108* 110* 116* 106* 126*   Lipid Profile: No results for input(s): CHOL, HDL, LDLCALC, TRIG, CHOLHDL, LDLDIRECT in the last 72 hours. Thyroid Function Tests: No results for input(s): TSH, T4TOTAL, FREET4, T3FREE, THYROIDAB in the last 72 hours. Anemia Panel: No results for input(s): VITAMINB12, FOLATE, FERRITIN, TIBC, IRON, RETICCTPCT in the last 72 hours. Sepsis Labs:  Recent Labs Lab 01/26/16 0300  LATICACIDVEN 1.0    Recent Results (from the past 240 hour(s))  Culture, blood (routine x 2)     Status: None   Collection Time: 02/01/2016 12:48 PM  Result Value Ref Range Status   Specimen Description BLOOD LEFT ANTECUBITAL  Final   Special Requests BOTTLES DRAWN AEROBIC AND ANAEROBIC 5 CC EA  Final   Culture   Final    NO GROWTH 5 DAYS [facility]    Report Status 06-02-2015 FINAL  Final  Culture, blood (routine x 2)     Status: Abnormal   Collection Time: 02/02/2016 12:48 PM  Result Value Ref Range Status   Specimen Description BLOOD LEFT ANTECUBITAL  Final   Special Requests BOTTLES DRAWN AEROBIC AND ANAEROBIC 5 CC EA  Final   Culture  Setup Time   Final    GRAM NEGATIVE RODS ANAEROBIC BOTTLE ONLY CRITICAL RESULT CALLED TO, READ BACK BY AND VERIFIED WITH: Haze BoydenN. GLOGOVAC, PHARM AT 0813 ON [id number] BY Lucienne CapersS. YARBROUGH [facility]    Culture [facility]MORGANELLA MORGANII (A)  Final   Report Status 01/26/2016 FINAL  Final   Organism ID, Bacteria MORGANELLA MORGANII  Final       Susceptibility   Morganella morganii - MIC*    AMPICILLIN >=32 RESISTANT Resistant     CEFAZOLIN >=64 RESISTANT Resistant     CEFEPIME <=1 SENSITIVE Sensitive     CEFTAZIDIME <=1 SENSITIVE Sensitive     CEFTRIAXONE <=1 SENSITIVE Sensitive     CIPROFLOXACIN <=0.25 SENSITIVE Sensitive     GENTAMICIN <=1 SENSITIVE Sensitive     IMIPENEM 4 SENSITIVE Sensitive     TRIMETH/SULFA <=20 SENSITIVE Sensitive     AMPICILLIN/SULBACTAM >=32 RESISTANT Resistant     PIP/TAZO <=4 SENSITIVE Sensitive     * MORGANELLA MORGANII  Blood Culture ID Panel (Reflexed)     Status: None   Collection Time: 01/29/2016 12:48 PM  Result Value Ref Range Status   Enterococcus species NOT DETECTED NOT DETECTED Final   Listeria monocytogenes NOT DETECTED NOT DETECTED Final   Staphylococcus species NOT DETECTED NOT DETECTED Final   Staphylococcus aureus NOT DETECTED NOT DETECTED Final   Streptococcus species NOT DETECTED NOT DETECTED Final   Streptococcus agalactiae NOT DETECTED NOT DETECTED Final   Streptococcus pneumoniae NOT DETECTED NOT DETECTED Final   Streptococcus pyogenes NOT DETECTED NOT DETECTED Final   Acinetobacter baumannii NOT DETECTED NOT DETECTED Final   Enterobacteriaceae species NOT DETECTED NOT DETECTED Final   Enterobacter cloacae complex NOT DETECTED NOT DETECTED Final   Escherichia coli NOT DETECTED NOT DETECTED Final   Klebsiella oxytoca NOT DETECTED NOT DETECTED Final   Klebsiella pneumoniae NOT DETECTED NOT DETECTED Final   Proteus species NOT DETECTED NOT DETECTED Final   Serratia marcescens NOT DETECTED NOT DETECTED Final   Haemophilus influenzae NOT DETECTED NOT DETECTED Final   Neisseria meningitidis NOT DETECTED NOT DETECTED Final   Pseudomonas aeruginosa NOT DETECTED NOT DETECTED Final   Candida albicans NOT DETECTED NOT DETECTED Final   Candida glabrata NOT DETECTED NOT DETECTED Final  Candida krusei NOT DETECTED NOT DETECTED Final   Candida parapsilosis NOT DETECTED NOT  DETECTED Final   Candida tropicalis NOT DETECTED NOT DETECTED Final    Comment: Performed at [facility]  Culture, Urine     Status: None   Collection Time: 01/17/2016  5:16 PM  Result Value Ref Range Status   Specimen Description URINE, RANDOM  Final   Special Requests NONE  Final   Culture NO GROWTH Performed at [facility]   Final   Report Status 01/24/2016 FINAL  Final  MRSA PCR Screening     Status: None   Collection Time: 01/19/2016  5:21 PM  Result Value Ref Range Status   MRSA by PCR NEGATIVE NEGATIVE Final    Comment:        The GeneXpert MRSA Assay (FDA approved for NASAL specimens only), is one component of a comprehensive MRSA colonization surveillance program. It is not intended to diagnose MRSA infection nor to guide or monitor treatment for MRSA infections.      Radiology Studies: US Abdomen Limited  Result Date: 01/30/2016 CLINICAL DATA:  Evaluate for ascites. EXAM: LIMITED ABDOMEN ULTRASOUND FOR ASCITES TECHNIQUE: Limited ultrasound survey for ascites was performed in all four abdominal quadrants. COMPARISON:  CT 01/24/2016 FINDINGS: Large amount of ascites in all 4 quadrants of the abdomen. Fluid appears to be simple. IMPRESSION: Large amount of ascites. Electronically Signed   By: Richarda Overlie M.D.   On: 01/30/2016 15:49   US Paracentesis  Result Date: 02/01/2016 INDICATION: Cirrhosis by imaging, CHF, renal insufficiency, ascites; request made for diagnostic and therapeutic paracentesis up to 7 liters. EXAM: ULTRASOUND GUIDED DIAGNOSTIC AND THERAPEUTIC PARACENTESIS MEDICATIONS: None. COMPLICATIONS: None immediate. PROCEDURE: Informed written consent was obtained from the patient after a discussion of the risks, benefits and alternatives to treatment. A timeout was performed prior to the initiation of the procedure. Initial ultrasound scanning demonstrates a large amount of ascites within the left lower abdominal quadrant. The left lower abdomen was  prepped and draped in the usual sterile fashion. 1% lidocaine was used for local anesthesia. Following this, a Yueh catheter was introduced. An ultrasound image was saved for documentation purposes. The paracentesis was performed. The catheter was removed and a dressing was applied. The patient tolerated the procedure well without immediate post procedural complication. FINDINGS: A total of approximately 7 liters of slightly hazy, amber fluid was removed. Samples were sent to the laboratory as requested by the clinical team. IMPRESSION: Successful ultrasound-guided diagnostic and therapeutic paracentesis yielding 7 liters of peritoneal fluid. Read by: Jeananne Rama, PA-C Electronically Signed   By: Jolaine Click M.D.   On: 02/01/2016 14:08    Scheduled Meds: . atorvastatin  40 mg Oral q1800  . cefTRIAXone (ROCEPHIN)  IV  2 g Intravenous Q24H  . furosemide  60 mg Intravenous BID  . lidocaine  1 application Urethral Once  . pantoprazole  40 mg Oral BID AC  . phytonadione  5 mg Oral Daily  . sodium chloride flush  10 mL Intravenous Q8H  . sucralfate  1 g Oral TID WC & HS   Continuous Infusions:     LOS: 9 days   Kadynce Bonds, Scheryl Marten, MD [facility] Pager [phone]  If 7PM-7AM, please contact night-coverage [email or web address] Password [email or web address] 02/01/2016, 3:42 PM

## 2016-02-01 NOTE — Treatment Plan (Signed)
Patient seen and examined. Full note will follow. Briefly, patient had refused FFP shortly after starting and after meeting with church members. Given high dose IV vitamin K. Follow up INR this AM 1.6. Discussed with IR who will plan paracentesis today. Monitor closely for hypotension.

## 2016-02-01 NOTE — Progress Notes (Signed)
Patient Name: Vincent HatchetJohn W Gibson Date of Encounter: 02/01/2016  Hospital Problem List     Principal Problem:   Sepsis [facility](HCC) Active Problems:   AKI (acute kidney injury) (HCC)   Urinary retention   Prolonged Q-T interval on ECG   Atrial fibrillation (HCC)   Chronic combined systolic and diastolic CHF (congestive heart failure) (HCC)   Cellulitis and abscess of right lower extremity   Pressure injury of skin   Rhabdomyolysis   GI bleeding   Coagulopathy (HCC)   Lactic acidosis   Elevated troponin   Shock (HCC)   Hypoalbuminemia   RBBB   Bacteremia   Acute respiratory failure (HCC)   Abdominal distention   Right heart failure   Ascites    Patient Profile     This is a 75 y.o. male has a past medical history significant for A-fib, chronic congestive heart failure with EF 45-50% (12/2015), hypertension, dyslipidemia and mild right heart failure with moderate pulmonary hypertension, presents with fall in his bathtub. Found to have acute renal failure and mild rhabomyolysis. Went into septic shock and had acute GIB as well. Now off warfarin. Troponin mildly elevated at 0.25, 0.36. BNP was 2155 on admission.    Subjective   Breathing OK and denies pain.  Looks uncomfortable but not in distress.    Inpatient Medications    . atorvastatin  40 mg Oral q1800  . cefTRIAXone (ROCEPHIN)  IV  2 g Intravenous Q24H  . furosemide  20 mg Intravenous Once  . furosemide  40 mg Intravenous BID  . lidocaine  1 application Urethral Once  . pantoprazole  40 mg Oral BID AC  . phytonadione  5 mg Oral Daily  . sodium chloride flush  10 mL Intravenous Q8H  . sucralfate  1 g Oral TID WC & HS    Vital Signs    Vitals:   01/31/16 2100 01/31/16 2200 02/01/16 0500 02/01/16 0700  BP: 107/67 118/63  105/66  Pulse: (!) 28 (!) 104  64  Resp: 20 14  18   Temp:   97.4 F (36.3 C)   TempSrc:   Oral   SpO2: 93% (!) 88%  100%  Weight:      Height:        Intake/Output Summary (Last 24 hours) at  02/01/16 0812 Last data filed at 01/31/16 2200  Gross per 24 hour  Intake              374 ml  Output             1775 ml  Net            -1401 ml   Filed Weights   01/29/16 0430 01/30/16 0500 01/31/16 0752  Weight: 289 lb 14.5 oz (131.5 kg) 285 lb 11.5 oz (129.6 kg) 276 lb 3.8 oz (125.3 kg)    Physical Exam    GEN: Well nourished, well developed, in  no acute distress.  Neck: Supple, no JVD, carotid bruits, or masses. Cardiac: RRR, no rubs, or gallops. No clubbing, cyanosis, severe diffuse edema.  Radials/DP/PT 2+ and equal bilaterally.  Respiratory:  Respirations decreased breath sounds regular and unlabored, clear to auscultation bilaterally. GI: Firm nontender,  distended, BS + x 4. Neuro:  Strength and sensation are intact.   Labs    CBC  Recent Labs  01/31/16 0401 02/01/16 0455  WBC 9.7 11.6*  HGB 10.0* 9.8*  HCT 30.3* 29.7*  MCV 95.9 96.4  PLT 159 181  Basic Metabolic Panel  Recent Labs  01/31/16 0401 02/01/16 0455  NA 149* 149*  K 3.3* 3.4*  CL 114* 113*  CO2 29 29  GLUCOSE 121* 151*  BUN 75* 66*  CREATININE 1.95* 1.92*  CALCIUM 8.7* 8.6*   Liver Function Tests No results for input(s): AST, ALT, ALKPHOS, BILITOT, PROT, ALBUMIN in the last 72 hours. No results for input(s): LIPASE, AMYLASE in the last 72 hours. Cardiac Enzymes  Recent Labs  01/30/16 2201 01/31/16 0401 01/31/16 1034  TROPONINI 0.14* 0.13* 0.12*   BNP Invalid input(s): POCBNP D-Dimer No results for input(s): DDIMER in the last 72 hours. Hemoglobin A1C No results for input(s): HGBA1C in the last 72 hours. Fasting Lipid Panel No results for input(s): CHOL, HDL, LDLCALC, TRIG, CHOLHDL, LDLDIRECT in the last 72 hours. Thyroid Function Tests No results for input(s): TSH, T4TOTAL, T3FREE, THYROIDAB in the last 72 hours.  Invalid input(s): FREET3  Telemetry    Atrial fib with RVR  ECG    NA  Radiology    Ct Abdomen Pelvis Wo Contrast  Result Date:  01/24/2016 CLINICAL DATA:  75 year old male inpatient with a history of atrial fibrillation on Coumadin, CHF, admitted after being found down for 3 days in the bathtub, treated for sepsis and acute renal failure, now with acute respiratory failure, ileus and abdominal distention. Left lung opacities on chest radiograph. EXAM: CT CHEST, ABDOMEN AND PELVIS WITHOUT CONTRAST TECHNIQUE: Multidetector CT imaging of the chest, abdomen and pelvis was performed following the standard protocol without IV contrast. COMPARISON:  01/24/2016 abdominal radiographs and 01/07/2016 chest radiographs. 07/02/2014 CT abdomen/pelvis. FINDINGS: Examination is significantly limited by patient body habitus, streak artifact from the patient's upper extremities, motion artifact and lack of IV contrast. CT CHEST FINDINGS Cardiovascular: Mild cardiomegaly. No significant pericardial fluid/thickening. Mildly atherosclerotic nonaneurysmal thoracic aorta. Dilated main pulmonary artery (3.7 cm diameter). Mediastinum/Nodes: No discrete thyroid nodules. Unremarkable esophagus. No pathologically enlarged axillary, mediastinal or gross hilar lymph nodes, noting limited sensitivity for the detection of hilar adenopathy on this noncontrast study. Lungs/Pleura: No pneumothorax. Small layering right pleural effusion. No left pleural effusion. There is layering debris in the tracheal lumen at the level of the thoracic inlet. There is a mosaic attenuation throughout both lungs. There are patchy bandlike areas of consolidation with associated volume loss in the bilateral lower lobes, favor atelectasis. Thin parenchymal bands in the right middle lobe and lingula are most consistent with areas of postinfectious/ postinflammatory scarring. No lung masses or significant pulmonary nodules in the aerated portions of the lungs. Musculoskeletal: No aggressive appearing focal osseous lesions. Mild-to-moderate thoracic spondylosis. Anasarca. CT ABDOMEN PELVIS FINDINGS  Hepatobiliary: There is relative hypertrophy of the left liver lobe and the liver surface is diffusely irregular, consistent with cirrhosis. No gross liver mass. Normal gallbladder with no radiopaque cholelithiasis. No biliary ductal dilatation. Pancreas: Normal, with no mass or duct dilation. Spleen: Normal size. No mass. Adrenals/Urinary Tract: No discrete adrenal nodules. No hydronephrosis. No renal stones. Simple 2.3 cm posterior upper right renal cyst. Simple 3.1 cm lateral interpolar left renal cyst. No additional contour deforming renal masses. Bladder is nearly collapsed by indwelling Foley catheter, with the suggestion of diffuse bladder wall thickening. Stomach/Bowel: Grossly normal stomach. Normal caliber small bowel with no small bowel wall thickening. Faintly visualized normal appendix. There is mild dilatation throughout the large bowel with mild stool and scattered fluid levels in the large bowel. No large bowel wall thickening. Vascular/Lymphatic: Mildly atherosclerotic nonaneurysmal abdominal aorta. No gross pathologically enlarged  lymph nodes in the abdomen or pelvis. Reproductive: Markedly enlarged prostate, not appreciably changed. Other: No pneumoperitoneum.  Large volume simple ascites. Musculoskeletal: No aggressive appearing focal osseous lesions. Moderate lumbar spondylosis. Anasarca. IMPRESSION: 1. Cirrhosis. No gross liver mass on this limited noncontrast CT study. Large volume ascites. Normal size spleen. 2. Mild diffuse large bowel dilatation with colonic fluid levels, consistent with mild colonic ileus. 3. Anasarca. 4. Cardiomegaly.  Small layering right pleural effusion. 5. Prominently dilated main pulmonary artery, likely indicating pulmonary arterial hypertension. Mosaic attenuation throughout the lungs could be due to mosaic perfusion from pulmonary vascular disease versus air trapping from small airways disease. 6. Patchy bandlike areas of consolidation with associated volume loss  in the lower lungs, favor atelectasis. 7. Markedly enlarged prostate. 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 Bladder nearly collapsed by indwelling Foley catheter. No hydronephrosis. 8. Aortic atherosclerosis. Electronically Signed   By: Delbert Phenix M.D.   On: 01/24/2016 18:30   Ct Chest Wo Contrast  Result Date: 01/24/2016 CLINICAL DATA:  75 year old male inpatient with a history of atrial fibrillation on Coumadin, CHF, admitted after being found down for 3 days in the bathtub, treated for sepsis and acute renal failure, now with acute respiratory failure, ileus and abdominal distention. Left lung opacities on chest radiograph. EXAM: CT CHEST, ABDOMEN AND PELVIS WITHOUT CONTRAST TECHNIQUE: Multidetector CT imaging of the chest, abdomen and pelvis was performed following the standard protocol without IV contrast. COMPARISON:  01/24/2016 abdominal radiographs and 01/28/2016 chest radiographs. 07/02/2014 CT abdomen/pelvis. FINDINGS: Examination is significantly limited by patient body habitus, streak artifact from the patient's upper extremities, motion artifact and lack of IV contrast. CT CHEST FINDINGS Cardiovascular: Mild cardiomegaly. No significant pericardial fluid/thickening. Mildly atherosclerotic nonaneurysmal thoracic aorta. Dilated main pulmonary artery (3.7 cm diameter). Mediastinum/Nodes: No discrete thyroid nodules. Unremarkable esophagus. No pathologically enlarged axillary, mediastinal or gross hilar lymph nodes, noting limited sensitivity for the detection of hilar adenopathy on this noncontrast study. Lungs/Pleura: No pneumothorax. Small layering right pleural effusion. No left pleural effusion. There is layering debris in the tracheal lumen at the level of the thoracic inlet. There is a mosaic attenuation throughout both lungs. There are patchy bandlike areas of consolidation with associated volume loss in the bilateral lower lobes,  favor atelectasis. Thin parenchymal bands in the right middle lobe and lingula are most consistent with areas of postinfectious/ postinflammatory scarring. No lung masses or significant pulmonary nodules in the aerated portions of the lungs. Musculoskeletal: No aggressive appearing focal osseous lesions. Mild-to-moderate thoracic spondylosis. Anasarca. CT ABDOMEN PELVIS FINDINGS Hepatobiliary: There is relative hypertrophy of the left liver lobe and the liver surface is diffusely irregular, consistent with cirrhosis. No gross liver mass. Normal gallbladder with no radiopaque cholelithiasis. No biliary ductal dilatation. Pancreas: Normal, with no mass or duct dilation. Spleen: Normal size. No mass. Adrenals/Urinary Tract: No discrete adrenal nodules. No hydronephrosis. No renal stones. Simple 2.3 cm posterior upper right renal cyst. Simple 3.1 cm lateral interpolar left renal cyst. No additional contour deforming renal masses. Bladder is nearly collapsed by indwelling Foley catheter, with the suggestion of diffuse bladder wall thickening. Stomach/Bowel: Grossly normal stomach. Normal caliber small bowel with no small bowel wall thickening. Faintly visualized normal appendix. There is mild dilatation throughout the large bowel with mild stool and scattered fluid levels in the large bowel. No large bowel wall thickening. Vascular/Lymphatic: Mildly atherosclerotic nonaneurysmal abdominal aorta. No gross pathologically enlarged lymph nodes in the abdomen or pelvis. Reproductive:  Markedly enlarged prostate, not appreciably changed. Other: No pneumoperitoneum.  Large volume simple ascites. Musculoskeletal: No aggressive appearing focal osseous lesions. Moderate lumbar spondylosis. Anasarca. IMPRESSION: 1. Cirrhosis. No gross liver mass on this limited noncontrast CT study. Large volume ascites. Normal size spleen. 2. Mild diffuse large bowel dilatation with colonic fluid levels, consistent with mild colonic ileus. 3.  Anasarca. 4. Cardiomegaly.  Small layering right pleural effusion. 5. Prominently dilated main pulmonary artery, likely indicating pulmonary arterial hypertension. Mosaic attenuation throughout the lungs could be due to mosaic perfusion from pulmonary vascular disease versus air trapping from small airways disease. 6. Patchy bandlike areas of consolidation with associated volume loss in the lower lungs, favor atelectasis. 7. Markedly enlarged prostate. 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 Bladder nearly collapsed by indwelling Foley catheter. No hydronephrosis. 8. Aortic atherosclerosis. Electronically Signed   By: Delbert Phenix M.D.   On: 01/24/2016 18:30   US Abdomen Limited  Result Date: 01/30/2016 CLINICAL DATA:  Evaluate for ascites. EXAM: LIMITED ABDOMEN ULTRASOUND FOR ASCITES TECHNIQUE: Limited ultrasound survey for ascites was performed in all four abdominal quadrants. COMPARISON:  CT 01/24/2016 FINDINGS: Large amount of ascites in all 4 quadrants of the abdomen. Fluid appears to be simple. IMPRESSION: Large amount of ascites. Electronically Signed   By: Richarda Overlie M.D.   On: 01/30/2016 15:49   Dg Chest Port 1 View  Result Date: 01/30/2016 CLINICAL DATA:  Shortness of breath, chest congestion. Fluid retention. History of CHF and hypertension EXAM: PORTABLE CHEST 1 VIEW COMPARISON:  01/25/2016 FINDINGS: Mild cardiomegaly with vascular congestion. Improving interstitial prominence within the lungs, likely improving edema. No confluent opacities or effusions. IMPRESSION: Cardiomegaly with vascular congestion. Improving interstitial edema. Electronically Signed   By: Charlett Nose M.D.   On: 01/30/2016 08:54   Dg Chest Port 1 View  Result Date: 01/25/2016 CLINICAL DATA:  PICC line placement.  Nasogastric tube placement. EXAM: PORTABLE CHEST 1 VIEW COMPARISON:  01/25/2016 FINDINGS: Nasogastric tube tip is in the stomach body. Right-sided  PICC line tip projects over the SVC. Leftward shift of cardiac and mediastinal structures. Bilateral interstitial accentuation. Enlarged main pulmonary artery. Left costophrenic angle excluded. IMPRESSION: 1. Nasogastric tube tip in the stomach body, PICC line tip in the SVC. 2. Potential mild cardiomegaly, with some leftward shift of the heart possibly due to left-sided atelectasis. 3. Prominent main pulmonary artery, potentially with some left perihilar atelectasis. 4. Interstitial accentuation could be from atypical pneumonia or mild interstitial edema. Electronically Signed   By: Gaylyn Rong M.D.   On: 01/25/2016 18:38   Dg Chest Port 1 View  Result Date: 01/25/2016 CLINICAL DATA:  Respiratory distress,  hypotension EXAM: PORTABLE CHEST 1 VIEW COMPARISON:  10/20/ 17 FINDINGS: Cardiomegaly is noted. There is NG tube coiled within stomach with tip in proximal stomach. Elevation of the right hemidiaphragm again noted. Persistent streaky left base retrocardiac atelectasis or infiltrate. IMPRESSION: There is NG tube coiled within stomach with tip in proximal stomach. Elevation of the right hemidiaphragm again noted. Persistent streaky left base retrocardiac atelectasis or infiltrate. Electronically Signed   By: Natasha Mead M.D.   On: 01/25/2016 10:23   Dg Abd Acute W/chest  Result Date: 01/08/2016 CLINICAL DATA:  75 year old male with abdominal distention. Initial encounter. EXAM: DG ABDOMEN ACUTE W/ 1V CHEST COMPARISON:  CT Abdomen and Pelvis 06/24/2014 and earlier. FINDINGS: Chronic cardiomegaly appears not significantly changed since 2011. However, there is abnormal increased left hilar and retrocardiac density.  No pneumothorax or pleural effusion. Crowding of lung markings elsewhere. No edema suspected. Large body habitus. No pneumoperitoneum. Supine and left-side-down lateral decubitus views of the abdomen are provided. Mild to moderate gaseous distension of the stomach and throughout the large  bowel. Several gas-filled but nondilated small bowel loops are noted. Paucity of gas in the rectum. Furthermore there is a hazy gray opacity throughout the abdomen. This can be seen with ascites. No acute or suspicious osseous lesion identified. IMPRESSION: 1. Abnormal increased left hilar and retrocardiac density in the chest. PA and lateral chest radiographs would be helpful when possible. Failing that, Chest CT (IV contrast preferred) would be recommended. 2. Wallace CullensGray hazy opacity throughout the abdomen suggesting Ascites. 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 3. Gas distended stomach and large bowel. Paucity of gas in the distal sigmoid colon and rectum. Small bowel loops nondilated at this time. Differential considerations include ileus and distal colon obstruction. Note that the prostate was severely enlarged on the 2016 CT Abdomen and Pelvis. Electronically Signed   By: Odessa FlemingH  Hall M.D.   On: 01/19/2016 12:03   Dg Abd Portable 1v  Result Date: 01/29/2016 CLINICAL DATA:  Abdominal distention for "a very long time" per pt. Pt would not give me any specifics on the length of time of the distention. EXAM: PORTABLE ABDOMEN - 1 VIEW COMPARISON:  01/25/2016 FINDINGS: There is increased bowel gas with less bowel distention than noted on the prior study. Rectal tube projects in the central pelvis. Previously seen nasogastric tube has been removed. IMPRESSION: 1. There is bowel distention consistent with a diffuse colonic adynamic ileus. This appears mildly decreased in severity when compared to the prior exam. Electronically Signed   By: Amie Portlandavid  Ormond M.D.   On: 01/29/2016 14:40   Dg Abd Portable 1v  Result Date: 01/25/2016 CLINICAL DATA:  Check nasogastric catheter placement EXAM: PORTABLE ABDOMEN - 1 VIEW COMPARISON:  01/24/2016 FINDINGS: Scattered large and small bowel gas is noted. The degree of gaseous distension of the colon is stable. A nasogastric catheter is now seen  within the stomach. No free air is seen. IMPRESSION: New nasogastric catheter within the stomach.  Stable colonic ileus. Electronically Signed   By: Alcide CleverMark  Lukens M.D.   On: 01/25/2016 10:23   Dg Abd Portable 1v  Result Date: 01/24/2016 CLINICAL DATA:  Ileus EXAM: PORTABLE ABDOMEN - 1 VIEW COMPARISON:  01/13/2016 FINDINGS: Scattered large and small bowel gas is noted. Gas distension of the colon is again identified and stable from the prior exam. The overall appearance is similar to that seen on the previous day. No free air is noted. Foley catheter is noted. IMPRESSION: Changes consistent with a colonic ileus. Correlation with physical exam is recommended. No significant change from the previous day is seen. Electronically Signed   By: Alcide CleverMark  Lukens M.D.   On: 01/24/2016 07:16    Assessment & Plan    SHOCK:  BP is stable.  Per CCM, and [facility]  ACUTE SYSTOLIC HF:  On IV Lasix.   Creat is stable.    Minus 5.5 liters since admit.  Yesterday negative 1.4 liters.   Continue IV diuresis.  He has anasarca.   INR is down.  Hopefully he will have a paracentesis today.  I did increase his diuresis.    ATRIAL FIB:  Rate controlled.  No anticoagulation with GI bleeding.    ELEVATED TROPONIN:    Mild elevation in the face of shock  ARF.  Not a diagnostic trend.  No invasive evaluation is indicated.   PROLONGED QTC:    Follow  Signed, Rollene Rotunda, MD  02/01/2016, 8:12 AM

## 2016-02-02 LAB — COMPREHENSIVE METABOLIC PANEL
ALBUMIN: 2.3 g/dL — AB (ref 3.5–5.0)
ALK PHOS: 62 U/L (ref 38–126)
ALT: 13 U/L — ABNORMAL LOW (ref 17–63)
ANION GAP: 7 (ref 5–15)
AST: 29 U/L (ref 15–41)
BILIRUBIN TOTAL: 2.4 mg/dL — AB (ref 0.3–1.2)
BUN: 57 mg/dL — ABNORMAL HIGH (ref 6–20)
CALCIUM: 8.3 mg/dL — AB (ref 8.9–10.3)
CO2: 31 mmol/L (ref 22–32)
Chloride: 113 mmol/L — ABNORMAL HIGH (ref 101–111)
Creatinine, Ser: 1.71 mg/dL — ABNORMAL HIGH (ref 0.61–1.24)
GFR calc non Af Amer: 37 mL/min — ABNORMAL LOW (ref >60)
GFR, EST AFRICAN AMERICAN: 43 mL/min — AB (ref >60)
GLUCOSE: 132 mg/dL — AB (ref 65–99)
POTASSIUM: 2.6 mmol/L — AB (ref 3.5–5.1)
SODIUM: 151 mmol/L — AB (ref 135–145)
TOTAL PROTEIN: 5.7 g/dL — AB (ref 6.5–8.1)

## 2016-02-02 LAB — CBC
HEMATOCRIT: 28.3 % — AB (ref 39.0–52.0)
HEMOGLOBIN: 9.6 g/dL — AB (ref 13.0–17.0)
MCH: 32.4 pg (ref 26.0–34.0)
MCHC: 33.9 g/dL (ref 30.0–36.0)
MCV: 95.6 fL (ref 78.0–100.0)
Platelets: 165 10*3/uL (ref 150–400)
RBC: 2.96 MIL/uL — AB (ref 4.22–5.81)
RDW: 17.7 % — ABNORMAL HIGH (ref 11.5–15.5)
WBC: 11.8 10*3/uL — ABNORMAL HIGH (ref 4.0–10.5)

## 2016-02-02 LAB — TYPE AND SCREEN
ABO/RH(D): B POS
ANTIBODY SCREEN: NEGATIVE

## 2016-02-02 LAB — PROTIME-INR
INR: 1.58
PROTHROMBIN TIME: 19 s — AB (ref 11.4–15.2)

## 2016-02-02 MED ORDER — POTASSIUM CHLORIDE 10 MEQ/100ML IV SOLN
10.0000 meq | INTRAVENOUS | Status: AC
  Administered 2016-02-02 (×5): 10 meq via INTRAVENOUS
  Filled 2016-02-02 (×3): qty 100

## 2016-02-02 MED ORDER — POTASSIUM CHLORIDE 10 MEQ/100ML IV SOLN
INTRAVENOUS | Status: AC
  Filled 2016-02-02: qty 100

## 2016-02-02 NOTE — Progress Notes (Signed)
Physical Therapy Treatment Patient Details Name: Vincent HatchetJohn W Gibson MRN: [id number] DOB: 11/20/1940 Today's Date: 02/02/2016    History of Present Illness Pt is a 75 y.o. male with medical history significant of hypertension, hyperlipidemia, atrial fibrillation, systolic heart failure, EF of 11-91%45-50%, moderate pulmonary hypertension and mild right heart failure admitted for acute kidney injury after remaining in his bathtub for 3-4 days due to inability to get out.    PT Comments    Vitals prior to session:  HR 98, BP 124/56, O2 100% on 3 lts, RR 12 Pt more alert and conversive.  Pt pushed call light to get help to reposition when we arrived.  + 2 assist pt tolerated sitting EOB x 7 min at Supervision level.  Vitals while EOB: HR 136, BP 107/42, RA 99%(trial), RR 19.  Attempted sit to stand however pt unable to lift buttocks off bed enough to functionally WB and support self.  +3 [facility] then assisted back to supine.  Positioned with multiple pillows to elevate B UE's and B LE's.  Reapplied SDC's.  Pt tolerated session however limited due to generalized weakness and low K+.   Follow Up Recommendations  SNF     Equipment Recommendations       Recommendations for Other Services       Precautions / Restrictions Precautions Precautions: Fall Restrictions Weight Bearing Restrictions: No Other Position/Activity Restrictions: Pt limited by edema in all extremities, low K+    Mobility  Bed Mobility Overal bed mobility: Needs Assistance;+2 for physical assistance Bed Mobility: Rolling;Sidelying to Sit;Supine to Sit;Sit to Supine;Sit to Sidelying Rolling: Max assist;+2 for physical assistance Sidelying to sit: Max assist;Total assist;+2 for physical assistance Supine to sit: Max assist;Total assist;+2 for physical assistance Sit to supine: Max assist;Total assist;+2 for physical assistance Sit to sidelying: +2 for physical assistance General bed mobility comments: + 2  assist for all bed mobility   Transfers Overall transfer level: Needs assistance    attempted sit to stand however pt too weak to clear buttocks off bed enough to WB thru B LE's.  So [facility] assist.             General transfer comment: EOB activity only due to generalized weakness and low K+  Ambulation/Gait                 Stairs            Wheelchair Mobility    Modified Rankin (Stroke Patients Only)       Balance                                    Cognition Arousal/Alertness: Awake/alert Behavior During Therapy: WFL for tasks assessed/performed Overall Cognitive Status: Within Functional Limits for tasks assessed                      Exercises      General Comments        Pertinent Vitals/Pain Pain Assessment: No/denies pain    Home Living                      Prior Function            PT Goals (current goals can now be found in the care plan section) Progress towards PT goals: Progressing toward goals    Frequency  Min 3X/week      PT Plan Current plan remains appropriate    Co-evaluation             End of Session Equipment Utilized During Treatment: Oxygen Activity Tolerance: Treatment limited secondary to medical complications (Comment) Patient left: in bed;with call bell/phone within reach     Time: 0955-1025 PT Time Calculation (min) (ACUTE ONLY): 30 min  Charges:  $Therapeutic Activity: 23-37 mins                    G Codes:      Felecia ShellingLori Giuseppina Quinones  PTA [facility]  Acute  Rehab Pager      [phone]

## 2016-02-02 NOTE — Progress Notes (Signed)
[facility] AD[facility] REPLACEMENT PROTOCOL FOR AM LAB REPLACEMENT ONLY  The patient does [facility] Protocol based on the criteria listed below:   1. Is GFR >/= 40 ml/min? Yes.    Patient's GFR today is 43 2. Is urine output >/= 0.5 ml/kg/hr for the last 6 hours? Yes.   Patient's UOP is 1.4 ml/kg/hr 3. Is BUN < 60 mg/dL? Yes.    Patient's BUN today is 57 4. Abnormal electrolyte(s): K 2.65. Ordered repletion with: protocol 6. If a panic level lab has been reported, has the CCM MD in charge been notified? No..   Physician:    Markus DaftWHELAN, Azam Gervasi A 02/02/2016 6:15 AM

## 2016-02-02 NOTE — Progress Notes (Signed)
[facility] PROGRESS NOTE Subjective breathing better since paracentesis no signs of active bleeding  Objective: Vital signs in last 24 hours: Temp:  [97.4 F (36.3 C)-98 F (36.7 C)] 98 F (36.7 C) (10/30 0800) Pulse Rate:  [44-110] 80 (10/30 0800) Resp:  [8-19] 14 (10/30 0800) BP: (91-126)/(38-73) 92/38 (10/30 0800) SpO2:  [91 %-100 %] 100 % (10/30 0800) Weight:  [113.9 kg (251 lb 1.7 oz)-129.6 kg (285 lb 11.5 oz)] 113.9 kg (251 lb 1.7 oz) (10/30 0500) Last BM Date: 02/01/16  Intake/Output from previous day: 10/29 0701 - 10/30 0700 In: 580 [P.O.:480; IV Piggyback:100] Out: 3875 [Urine:3875] Intake/Output this shift: No intake/output data recorded.  PE: General-- sleepy no distress  Abdomen-- less distended  Lab Results:  Recent Labs  01/31/16 0401 02/01/16 0455 02/02/16 0425  WBC 9.7 11.6* 11.8*  HGB 10.0* 9.8* 9.6*  HCT 30.3* 29.7* 28.3*  PLT 159 181 165   BMET  Recent Labs  01/31/16 0401 02/01/16 0455 02/02/16 0425  NA 149* 149* 151*  K 3.3* 3.4* 2.6*  CL 114* 113* 113*  CO2 29 29 31   CREATININE 1.95* 1.92* 1.71*   LFT  Recent Labs  02/02/16 0425  PROT 5.7*  AST 29  ALT 13*  ALKPHOS 62  BILITOT 2.4*   PT/INR  Recent Labs  01/31/16 0401 02/01/16 0455 02/02/16 0425  LABPROT 44.2* 19.5* 19.0*  INR 4.53* 1.63 1.58   PANCREAS No results for input(s): LIPASE in the last 72 hours.       Studies/Results: [place] Paracentesis  Result Date: 02/01/2016 INDICATION: Cirrhosis by imaging, CHF, renal insufficiency, ascites; request made for diagnostic and therapeutic paracentesis up to 7 liters. EXAM: ULTRASOUND GUIDED DIAGNOSTIC AND THERAPEUTIC PARACENTESIS MEDICATIONS: None. COMPLICATIONS: None immediate. PROCEDURE: Informed written consent was obtained from the patient after a discussion of the risks, benefits and alternatives to treatment. A timeout was performed prior to the initiation of the procedure. Initial ultrasound scanning  demonstrates a large amount of ascites within the left lower abdominal quadrant. The left lower abdomen was prepped and draped in the usual sterile fashion. 1% lidocaine was used for local anesthesia. Following this, a Yueh catheter was introduced. An ultrasound image was saved for documentation purposes. The paracentesis was performed. The catheter was removed and a dressing was applied. The patient tolerated the procedure well without immediate post procedural complication. FINDINGS: A total of approximately 7 liters of slightly hazy, amber fluid was removed. Samples were sent to the laboratory as requested by the clinical team. IMPRESSION: Successful ultrasound-guided diagnostic and therapeutic paracentesis yielding 7 liters of peritoneal fluid. Read by: Jeananne RamaKevin Allred, PA-C Electronically Signed   By: Jolaine ClickArthur  Hoss M.D.   On: 02/01/2016 14:08    Medications: I have reviewed the patient's current medications.  Assessment/Plan: 1. G.I. bleeding. Grossly stable possibly from esophagitis. 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 2. 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 This is based on imaging criteria. 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 Complicated by chronic cardiac failure etc. He finally responded to high-dose vitamin K and paracentesis was performed. 3. SBP. Culture on paracentesis fluid is pending. He had over 400 white cells nearly all policies consistent with SBP. It is unclear why he had this since he has been on Zosyn, vancomycin, and now Rocephin ever since his admission. Will continue the Rocephin pending cultures.   Arington JR,Donnae Michels L 02/02/2016, 9:31 AM  This note was created using voice recognition software. Minor errors may Have occurred unintentionally.  Pager:  [phone] If no answer or after hours call [phone]

## 2016-02-02 NOTE — Progress Notes (Signed)
CRITICAL VALUE ALERT  Critical value received:  Potassium 2.6  Date of notification: 10/30  Time of notification: 0522  Critical value read back:yes  Nurse who received alert:  Porfirio OarSam Hogue  MD notified (1st page): Kschor  Time of first page: [phone] MD notified (2nd page):

## 2016-02-02 NOTE — Progress Notes (Addendum)
Initial Nutrition Assessment  DOCUMENTATION CODES:   Not applicable  INTERVENTION:  - Continue FLD and advance diet as medically feasible. - Continue to encourage PO intakes. - RD will continue to monitor for additional nutrition-related needs.  NUTRITION DIAGNOSIS:   Inadequate oral intake related to acute illness, poor appetite as evidenced by per patient/family report.  GOAL:   Patient will meet greater than or equal to 90% of their needs  MONITOR:   PO intake, Diet advancement, Weight trends, Labs, I & O's  REASON FOR ASSESSMENT:   LOS  ASSESSMENT:   75 y.o. male with medical history significant of hypertension, hyperlipidemia, atrial fibrillation, systolic heart failure, EF of 32-44%45-50%, moderate pulmonary hypertension and mild right heart failure (ECHO on 9/17). He fell in the bathtub 3 days PTA and was not been able to get out. His church friend found him on day of admission. He had not urinated since the day he fell. Pt previously with NGT to suction which has since been removed and colonic ileus continues to resolve.   Pt seen for LOS (10 days). Pt currently on FLD and meal tray was at bedside, untouched, at time of visit a few minutes ago. Per chart review, diet changes as follows: 10/20 @ 1643: NPO 10/21 @ 1616: Dysphagia 3, thin liquids 10/22 @ 0129: NPO 10/25 @ 1344: CLD 10/26 @ 0942: FLD  Per flow sheet, pt consumed 25% of dinner on 10/28 and no other intakes documented since admission. Pt not interested in discussion with RD but does state that he eats when he wants and even PTA did not eat set meals.   Per chart review, pt underwent paracentesis yesterday with 7L removed at that time.   Medications reviewed; 50 mg albumin x1 dose yesterday, 60 mg IV Lasix BID, PRN IV Reglan, 40 mg oral Protonix BID, PRN Senokot, 1 g Carafate QID.  Labs reviewed; Na: 151 mmol/L, K: 2.6 mmol/L, Cl: 113 mmol/L, BUN: 57 mg/dL, creatinine: 0.101.71 mg/dL, Ca: 8.3 mg/dL, GFR: 43 mL/hr.    ADDENDUM: Unable to complete physical assessment. Visualized some degree of muscle wasting to face, shoulders, and clavicle area. Abdomen remains distended.    Diet Order:  Diet full liquid Room service appropriate? Yes; Fluid consistency: Thin  Skin:  Wound (see comment) (Stage 2 sacral and stage 1 neck pressure injuries)  Last BM:  10/30  Height:   Ht Readings from Last 1 Encounters:  01/29/2016 5\' 11"  (1.803 m)    Weight:   Wt Readings from Last 1 Encounters:  02/02/16 251 lb 1.7 oz (113.9 kg)    Ideal Body Weight:  78.18 kg  BMI:  Body mass index is 35.02 kg/m.  Estimated Nutritional Needs:   Kcal:  1600-1800  Protein:  90-100 grams  Fluid:  per MD/NP  EDUCATION NEEDS:   No education needs identified at this time    Trenton GammonJessica Kaiyu Mirabal, MS, RD, LDN Inpatient Clinical Dietitian Pager # [phone] After hours/weekend pager # [phone]

## 2016-02-02 NOTE — Progress Notes (Signed)
Date:  February 02, 2016 Chart reviewed for concurrent status and case management needs. Will continue to follow the patient for status change: hypokalemia with few pvc's, hypernatremia iv lfds and monitoring/had paracentesis 02/01/2016-7 liters obtained-hypotensive. Discharge Planning: following for needs Expected discharge date: [id number] Marcelle SmilingRhonda Davis, BSN, [id number], [place]   [phone]

## 2016-02-02 NOTE — Progress Notes (Signed)
PROGRESS NOTE    Vincent Gibson  ZOX:[id number] DOB: [birth year] DOA: 01/24/2016 PCP: Georgianne Fick, MD    Brief Narrative:  75 y.o.malewith a history of AFib on coumadin, chronic HFrEF (45-50%) and right heart failure, pulmonary HTN who was found down for 3 days in a bathtub. He stated he was too weak to get up and had not taken any po nor urinated or defecated during that time. On arrival he was alert and oriented, dry mucous membranes and had anasarca with leg swelling R>L with evidence of cellulitis on the right leg. Abdomen was tympanitic and distended. Rectal temperature was 96.36F, glucose 26mg /dl, hypotensive and tachycardic saturating 90% on room air, 99% with 2L O2. Labs were significant for creatinine 3.32, BUN 86, bicarbonate 21, CK 528, troponin 0.36, BNP 2,155. Abdomen with chest x-ray showed abnormal increased left hilar and retrocardiac density in the chest, findings suggestive of ascites and colonic ileus. INR on arrival was supratherapeutic at 6, jumped to 10 10/21. Oral vitamin K given during the day. CT imaging showed cirrhosis (no known history of this) and large volume ascites.   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 NG tube placed, boluses, octreotide and protonix gtt started, IV vitamin K was given. BP remained low despite NS, likely third spacing IV fluid. Pt is DNR and a Jehovah's witness, refusing blood transfusions. Levophed started. After discussion with patient, verbal consent for "blood substitutes" including albumin and Kcentra was obtained, and these were given. INR has normalized and no further signs of bleeding. Levophed was weaned off 10/23.  Assessment & Plan:   Principal Problem:   Sepsis (HCC) Active Problems:   AKI (acute kidney injury) (HCC)   Urinary retention   Prolonged Q-T interval on ECG   Atrial fibrillation (HCC)   Chronic combined  systolic and diastolic CHF (congestive heart failure) (HCC)   Cellulitis and abscess of right lower extremity   Pressure injury of skin   Rhabdomyolysis   GI bleeding   Coagulopathy (HCC)   Lactic acidosis   Elevated troponin   Shock (HCC)   Hypoalbuminemia   RBBB   Bacteremia   Acute respiratory failure (HCC)   Abdominal distension   Right heart failure   Ascites   Dysphagia, oropharyngeal phase   Hypovolemic shock due to hematemesis with acute blood loss anemia:   -Suspect ongoing anticoagulation secondary to liver disease -Hemoglobin stable today -Patient is continued on PPI -Gastroneurology following -Patient is status post EGD on Feb 20, 2016 with evidence of gastritis, healing duodenal ulcer, no varices  Severe sepsis from Morganella bacteremia/RLE cellulitis:  -Lactic acid had returned to normal limits -Patient is noted to have 1/2 morbanella species and blood cultures sensitive to Rocephin.  -We'll continue Rocephin for now (see below)  Acute renal failure/anasarca ?hepatorenal syndrome:  -Patient presented with a creatinine of 3.32, baseline creatinine is uncertain -Likely renal failure secondary to rhabdomyolysis versus acute heart failure -Labs reviewed. Creatinine now down to 1.71 -Follow-up on complaints of metabolic panel in the morning  Chronic systolic heart failure and right heart failure:  -Patient noted to have EF of 45-50% with mild LVH -Cardiology is following -Plan to continue to diuresis as tolerated -Patient still volume overloaded, albeit improving  Atrial fibrillation:  -Patient presently rate controlled -Cardiology following -Continue to hold beta blocker secondary to soft blood pressure -Anticoagulation on hold secondary to concerns of acute blood loss anemia  Acute respiratory failure:  -O2  requirements have improved to 2 L nasal cannula at present -Continue to wean O2 as tolerated -O2 requirements improved following large-volume  paracentesis  Colonic ileus:  -We'll continue diet as tolerated -Recent x-ray with adynamic ileus noted  Ascites, possible SBP: -Large ascites noted on multiple recent imaging studies -Patient underwent large-volume paracentesis yielding 7 L of ascitic fluid via ultrasound-guided paracentesis on 02/01/2016. -Patient clinically improved following paracentesis -Fluid analysis is suggestive of SBP. GI recommendations to continue Rocephin for now  Mild rhabdomyolysis:  -Resolved -CK levels have since returned to normal limits  Severe deconditioning:  -PT recommendations from today noted with recommendations for skilled nursing facility at time of discharge  Prolonged QTc interval:  -Presenting QTC of 537. -We will continue to monitor on telemetry for now, thus far stable  Urinary retention:  -Enlarged prostate noted on CT scan abdomen pelvis -Patient is continued on Foley catheter to monitor strict output  Coagulopathy -Coagulopathy successfully resolved with high-dose IV vitamin K -Will hold further vitamin K for the time being -Monitor INR closely. If INR continues to trend up, will likely need more vitamin K -Fibrinogen reviewed and is within normal limits.  Hypokalemia -Replaced -Will repeat competency metabolic panel morning  DVT prophylaxis: SCDs Code Status: DO NOT RESUSCITATE Family Communication: Patient in room, family not at bedside Disposition Plan: Uncertain at this time  Consultants:   Gastroenterology  Cardiology  Pulmonary critical care  Discussed case with hematology (Dr. Shirline Frees)  Procedures:   Foley placement by Dr. [facility]Diarm[facility] on 01/30/2016  Right lower extremity Doppler ultrasound 01/11/2016 negative for DVT  Right upper extremity Doppler ultrasound on 02/02/2016 negative for SVT or DVT  Patient developed hematemesis on 01/25/2016, initiated Levothroid until 01/26/2016  EGD by gastroenterology on  01/04/2016  Ultrasound-guided paracentesis by IR on 02/01/2016 yielding 7 L of ascitic fluid  Antimicrobials: Anti-infectives    Start     Dose/Rate Route Frequency Ordered Stop   01/26/16 1000  cefTRIAXone (ROCEPHIN) 2 g in dextrose 5 % 50 mL IVPB     2 g 100 mL/hr over 30 Minutes Intravenous Every 24 hours 01/26/16 0849     01/25/16 1600  vancomycin (VANCOCIN) 1,500 mg in sodium chloride 0.9 % 500 mL IVPB  Status:  Discontinued     1,500 mg 250 mL/hr over 120 Minutes Intravenous Every 48 hours 01/31/2016 1450 01/24/16 1048   01/24/16 1600  vancomycin (VANCOCIN) 1,500 mg in sodium chloride 0.9 % 500 mL IVPB  Status:  Discontinued     1,500 mg 250 mL/hr over 120 Minutes Intravenous Every 48 hours 01/24/16 1048 01/26/16 0816   01/06/2016 2200  piperacillin-tazobactam (ZOSYN) IVPB 2.25 g  Status:  Discontinued     2.25 g 100 mL/hr over 30 Minutes Intravenous Every 6 hours 01/18/2016 1450 01/26/16 0849   01/04/2016 1445  vancomycin (VANCOCIN) 1,500 mg in sodium chloride 0.9 % 500 mL IVPB  Status:  Discontinued     1,500 mg 250 mL/hr over 120 Minutes Intravenous STAT 01/21/2016 1440 01/24/16 1048   01/16/2016 1430  piperacillin-tazobactam (ZOSYN) IVPB 3.375 g     3.375 g 100 mL/hr over 30 Minutes Intravenous  Once 01/31/2016 1420 01/10/2016 1508   01/07/2016 1430  vancomycin (VANCOCIN) IVPB 1000 mg/200 mL premix  Status:  Discontinued     1,000 mg 200 mL/hr over 60 Minutes Intravenous  Once 01/22/2016 1420 01/05/2016 1608      Subjective: No complaints this morning. Patient reports less abdominal discomfort  Objective: Vitals:   02/02/16  1300 02/02/16 1400 02/02/16 1500 02/02/16 1600  BP: (!) 105/53 (!) 105/43 (!) 104/45 (!) 109/43  Pulse: 90 90 78 92  Resp: 18 (!) 9 10 13   Temp:      TempSrc:      SpO2: 100% 100% 100% 100%  Weight:      Height:        Intake/Output Summary (Last 24 hours) at 02/02/16 1707 Last data filed at 02/02/16 1500  Gross per 24 hour  Intake              840 ml   Output             4575 ml  Net            -3735 ml   Filed Weights   01/31/16 0752 02/01/16 1000 02/02/16 0500  Weight: 125.3 kg (276 lb 3.8 oz) 129.6 kg (285 lb 11.5 oz) 113.9 kg (251 lb 1.7 oz)    Examination:  General exam:Awake, lying in bed, no acute distress Respiratory system: Normal chest rise, distant breath sounds, no audible wheezing Cardiovascular system: Regular rate, S1-S2 Gastrointestinal system: Less distended today, positive bowel sounds Central nervous system: No tremors, sensation intact Extremities: No clubbing, perfused Skin: No pallor, no notable skin lesions seen Psychiatry: Normal mood, no visual hallucinations  Data Reviewed: I have personally reviewed following labs and imaging studies  CBC:  Recent Labs Lab 01/27/16 0600  01/29/16 0435 01/30/16 0500 01/31/16 0401 02/01/16 0455 02/02/16 0425  WBC 14.7*  < > 9.6 9.6 9.7 11.6* 11.8*  NEUTROABS 13.2*  --   --   --   --   --   --   HGB 9.4*  < > 9.9* 9.8* 10.0* 9.8* 9.6*  HCT 27.9*  < > 29.5* 29.8* 30.3* 29.7* 28.3*  MCV 93.3  < > 95.5 95.8 95.9 96.4 95.6  PLT 95*  < > 110* 133* 159 181 165  < > = values in this interval not displayed. Basic Metabolic Panel:  Recent Labs Lab 08-26-2015 0348 01/29/16 0435 01/30/16 0500 01/31/16 0401 02/01/16 0455 02/02/16 0425  NA 147* 150* 149* 149* 149* 151*  K 3.3* 3.4* 3.4* 3.3* 3.4* 2.6*  CL 114* 117* 115* 114* 113* 113*  CO2 26 25 28 29 29 31   GLUCOSE 122* 116* 125* 121* 151* 132*  BUN 96* 92* 81* 75* 66* 57*  CREATININE 2.78* 2.49* 2.33* 1.95* 1.92* 1.71*  CALCIUM 8.5* 8.6* 8.6* 8.7* 8.6* 8.3*  MG 2.1  --   --   --   --   --    GFR: Estimated Creatinine Clearance: 47.9 mL/min (by C-G formula based on SCr of 1.71 mg/dL (H)). Liver Function Tests:  Recent Labs Lab 01/27/16 0600 08-26-2015 0348 01/29/16 0435 02/02/16 0425  AST 20 21 22 29   ALT 18 17 17  13*  ALKPHOS 50 49 49 62  BILITOT 2.2* 2.3* 2.7* 2.4*  PROT 6.1* 6.2* 6.2* 5.7*   ALBUMIN 2.5* 2.5* 2.4* 2.3*   No results for input(s): LIPASE, AMYLASE in the last 168 hours. No results for input(s): AMMONIA in the last 168 hours. Coagulation Profile:  Recent Labs Lab 01/29/16 0435 01/30/16 0500 01/31/16 0401 02/01/16 0455 02/02/16 0425  INR 3.70 4.45* 4.53* 1.63 1.58   Cardiac Enzymes:  Recent Labs Lab 01/30/16 2201 01/31/16 0401 01/31/16 1034  TROPONINI 0.14* 0.13* 0.12*   BNP (last 3 results) No results for input(s): PROBNP in the last 8760 hours. HbA1C: No results for input(s): HGBA1C  in the last 72 hours. CBG:  Recent Labs Lab 02/01/16 0431 02/01/16 0807 02/01/16 1207 02/01/16 1652 02/01/16 1938  GLUCAP 116* 106* 126* 124* 94   Lipid Profile: No results for input(s): CHOL, HDL, LDLCALC, TRIG, CHOLHDL, LDLDIRECT in the last 72 hours. Thyroid Function Tests: No results for input(s): TSH, T4TOTAL, FREET4, T3FREE, THYROIDAB in the last 72 hours. Anemia Panel: No results for input(s): VITAMINB12, FOLATE, FERRITIN, TIBC, IRON, RETICCTPCT in the last 72 hours. Sepsis Labs: No results for input(s): PROCALCITON, LATICACIDVEN in the last 168 hours.  Recent Results (from the past 240 hour(s))  Culture, Urine     Status: None   Collection Time: 01/25/2016  5:16 PM  Result Value Ref Range Status   Specimen Description URINE, RANDOM  Final   Special Requests NONE  Final   Culture NO GROWTH Performed at [facility]   Final   Report Status 01/24/2016 FINAL  Final  MRSA PCR Screening     Status: None   Collection Time: 02/02/2016  5:21 PM  Result Value Ref Range Status   MRSA by PCR NEGATIVE NEGATIVE Final    Comment:        The GeneXpert MRSA Assay (FDA approved for NASAL specimens only), is one component of a comprehensive MRSA colonization surveillance program. It is not intended to diagnose MRSA infection nor to guide or monitor treatment for MRSA infections.   Gram stain     Status: None   Collection Time: 02/01/16 10:03 AM   Result Value Ref Range Status   Specimen Description PARACENTESIS  Final   Special Requests NONE  Final   Gram Stain   Final    MODERATE WBC PRESENT, PREDOMINANTLY PMN NO ORGANISMS SEEN Performed at [facility]    Report Status 02/01/2016 FINAL  Final     Radiology Studies: US Paracentesis  Result Date: 02/01/2016 INDICATION: Cirrhosis by imaging, CHF, renal insufficiency, ascites; request made for diagnostic and therapeutic paracentesis up to 7 liters. EXAM: ULTRASOUND GUIDED DIAGNOSTIC AND THERAPEUTIC PARACENTESIS MEDICATIONS: None. COMPLICATIONS: None immediate. PROCEDURE: Informed written consent was obtained from the patient after a discussion of the risks, benefits and alternatives to treatment. A timeout was performed prior to the initiation of the procedure. Initial ultrasound scanning demonstrates a large amount of ascites within the left lower abdominal quadrant. The left lower abdomen was prepped and draped in the usual sterile fashion. 1% lidocaine was used for local anesthesia. Following this, a Yueh catheter was introduced. An ultrasound image was saved for documentation purposes. The paracentesis was performed. The catheter was removed and a dressing was applied. The patient tolerated the procedure well without immediate post procedural complication. FINDINGS: A total of approximately 7 liters of slightly hazy, amber fluid was removed. Samples were sent to the laboratory as requested by the clinical team. IMPRESSION: Successful ultrasound-guided diagnostic and therapeutic paracentesis yielding 7 liters of peritoneal fluid. Read by: Jeananne Rama, PA-C Electronically Signed   By: Jolaine Click M.D.   On: 02/01/2016 14:08    Scheduled Meds: . atorvastatin  40 mg Oral q1800  . cefTRIAXone (ROCEPHIN)  IV  2 g Intravenous Q24H  . furosemide  60 mg Intravenous BID  . lidocaine  1 application Urethral Once  . pantoprazole  40 mg Oral BID AC  . sodium chloride flush  10 mL  Intravenous Q8H  . sucralfate  1 g Oral TID WC & HS   Continuous Infusions:     LOS: 10 days   Collyn Selk,  Scheryl MartenSTEPHEN K, MD [facility] Pager [phone]  If 7PM-7AM, please contact night-coverage [email or web address] Password [email or web address] 02/02/2016, 5:07 PM

## 2016-02-02 NOTE — Progress Notes (Signed)
    Subjective:  Denies CP or dyspnea   Objective:  Vitals:   02/02/16 0705 02/02/16 0800 02/02/16 0900 02/02/16 1149  BP: (!) 105/49 (!) 92/38 91/66   Pulse: 92 80 98   Resp: 10 14 11    Temp:  98 F (36.7 C)  98.1 F (36.7 C)  TempSrc:  Oral  Oral  SpO2: 100% 100% 100%   Weight:      Height:        Intake/Output from previous day:  Intake/Output Summary (Last 24 hours) at 02/02/16 1228 Last data filed at 02/02/16 1038  Gross per 24 hour  Intake              780 ml  Output             3775 ml  Net            -2995 ml    Physical Exam: Physical exam: Well-developed well-nourished in no acute distress.  Skin is warm and dry.  HEENT is normal.  Neck is supple. No thyromegaly.  Chest is clear to auscultation with normal expansion.  Cardiovascular exam is regular rate and rhythm.  Abdominal exam nontender or distended. No masses palpated. Extremities show no edema. neuro grossly intact    Lab Results: Basic Metabolic Panel:  Recent Labs  16/01/9609/29/17 0455 02/02/16 0425  NA 149* 151*  K 3.4* 2.6*  CL 113* 113*  CO2 29 31  GLUCOSE 151* 132*  BUN 66* 57*  CREATININE 1.92* 1.71*  CALCIUM 8.6* 8.3*   CBC:  Recent Labs  02/01/16 0455 02/02/16 0425  WBC 11.6* 11.8*  HGB 9.8* 9.6*  HCT 29.7* 28.3*  MCV 96.4 95.6  PLT 181 165   Cardiac Enzymes:  Recent Labs  01/30/16 2201 01/31/16 0401 01/31/16 1034  TROPONINI 0.14* 0.13* 0.12*     Assessment/Plan:  This is a 75 y.o.male has apast medical history significant for A-fib, chronic congestive heart failure with EF 45-50% (12/2015), hypertension, dyslipidemia and mild right heart failure with moderate pulmonary hypertension, presents with fall in his bathtub. Found to have acute renal failure and mild rhabomyolysis. Went into septic shock and had acute GIB as well. Now off warfarin. Troponin mildly elevated at 0.25, 0.36.   1 Atrial fibrillation-rate is controlled on no medications. Anticoagulation on  hold given recent GI bleed.  2 acute combined systolic/diastolic congestive heart failure-patient is markedly volume overloaded and there is probable component of third spacing. Continue Lasix as tolerated by renal function. BUN and creatinine improving with diuresis.  3 hypernatremia-would liberalize free H2O.  4 shock-resolved.   5 GI bleed-management per internal medicine and gastroenterology.    Olga MillersBrian Anjanette Gilkey 02/02/2016, 12:28 PM

## 2016-02-03 DIAGNOSIS — I482 Chronic atrial fibrillation: Secondary | ICD-10-CM

## 2016-02-03 LAB — COMPREHENSIVE METABOLIC PANEL
ALBUMIN: 2.4 g/dL — AB (ref 3.5–5.0)
ALK PHOS: 66 U/L (ref 38–126)
ALT: 14 U/L — AB (ref 17–63)
ANION GAP: 8 (ref 5–15)
AST: 35 U/L (ref 15–41)
BUN: 48 mg/dL — ABNORMAL HIGH (ref 6–20)
CALCIUM: 8.1 mg/dL — AB (ref 8.9–10.3)
CHLORIDE: 110 mmol/L (ref 101–111)
CO2: 33 mmol/L — AB (ref 22–32)
Creatinine, Ser: 1.63 mg/dL — ABNORMAL HIGH (ref 0.61–1.24)
GFR calc non Af Amer: 40 mL/min — ABNORMAL LOW (ref >60)
GFR, EST AFRICAN AMERICAN: 46 mL/min — AB (ref >60)
GLUCOSE: 128 mg/dL — AB (ref 65–99)
Potassium: 2 mmol/L — CL (ref 3.5–5.1)
SODIUM: 151 mmol/L — AB (ref 135–145)
Total Bilirubin: 2.4 mg/dL — ABNORMAL HIGH (ref 0.3–1.2)
Total Protein: 5.9 g/dL — ABNORMAL LOW (ref 6.5–8.1)

## 2016-02-03 LAB — GLUCOSE, CAPILLARY
GLUCOSE-CAPILLARY: 135 mg/dL — AB (ref 65–99)
GLUCOSE-CAPILLARY: 114 mg/dL — AB (ref 65–99)
Glucose-Capillary: 123 mg/dL — ABNORMAL HIGH (ref 65–99)

## 2016-02-03 LAB — PROTIME-INR
INR: 1.54
Prothrombin Time: 18.7 seconds — ABNORMAL HIGH (ref 11.4–15.2)

## 2016-02-03 LAB — CBC
HCT: 27.2 % — ABNORMAL LOW (ref 39.0–52.0)
HEMOGLOBIN: 9 g/dL — AB (ref 13.0–17.0)
MCH: 31.5 pg (ref 26.0–34.0)
MCHC: 33.1 g/dL (ref 30.0–36.0)
MCV: 95.1 fL (ref 78.0–100.0)
Platelets: 160 10*3/uL (ref 150–400)
RBC: 2.86 MIL/uL — AB (ref 4.22–5.81)
RDW: 17.3 % — ABNORMAL HIGH (ref 11.5–15.5)
WBC: 12.2 10*3/uL — ABNORMAL HIGH (ref 4.0–10.5)

## 2016-02-03 LAB — MAGNESIUM: Magnesium: 1.5 mg/dL — ABNORMAL LOW (ref 1.7–2.4)

## 2016-02-03 MED ORDER — MAGNESIUM SULFATE 4 GM/100ML IV SOLN
4.0000 g | Freq: Once | INTRAVENOUS | Status: AC
  Administered 2016-02-03: 4 g via INTRAVENOUS
  Filled 2016-02-03: qty 100

## 2016-02-03 MED ORDER — ALBUMIN HUMAN 25 % IV SOLN
25.0000 g | Freq: Once | INTRAVENOUS | Status: AC
  Administered 2016-02-03: 25 g via INTRAVENOUS
  Filled 2016-02-03: qty 50

## 2016-02-03 MED ORDER — ALBUMIN HUMAN 25 % IV SOLN
INTRAVENOUS | Status: AC
  Filled 2016-02-03: qty 50

## 2016-02-03 MED ORDER — ALBUMIN HUMAN 25 % IV SOLN
12.5000 g | Freq: Once | INTRAVENOUS | Status: AC
  Administered 2016-02-03: 12.5 g via INTRAVENOUS
  Filled 2016-02-03: qty 50

## 2016-02-03 MED ORDER — POTASSIUM CHLORIDE 10 MEQ/100ML IV SOLN
10.0000 meq | INTRAVENOUS | Status: AC
  Administered 2016-02-03 (×6): 10 meq via INTRAVENOUS
  Filled 2016-02-03 (×6): qty 100

## 2016-02-03 NOTE — Progress Notes (Signed)
[facility] PROGRESS NOTE Subjective patient moaning and groaning but denies any shortness of breath. No gross signs of bleeding.  Objective: Vital signs in last 24 hours: Temp:  [97.7 F (36.5 C)-98.5 F (36.9 C)] 97.7 F (36.5 C) (10/31 0410) Pulse Rate:  [44-126] 74 (10/31 0600) Resp:  [9-22] 17 (10/31 0600) BP: (83-118)/(24-66) 102/45 (10/31 0600) SpO2:  [99 %-100 %] 100 % (10/31 0600) Weight:  [113.9 kg (251 lb 1.7 oz)] 113.9 kg (251 lb 1.7 oz) (10/31 0410) Last BM Date: 02/01/16  Intake/Output from previous day: 10/30 0701 - 10/31 0700 In: 540 [P.O.:240; I.V.:50; IV Piggyback:250] Out: 5950 [Urine:5950] Intake/Output this shift: No intake/output data recorded.  PE:  Abdomen-- still slightly distended but nontender good bowel sounds.  Lab Results:  Recent Labs  02/01/16 0455 02/02/16 0425 02/03/16 0443  WBC 11.6* 11.8* 12.2*  HGB 9.8* 9.6* 9.0*  HCT 29.7* 28.3* 27.2*  PLT 181 165 160   BMET  Recent Labs  02/01/16 0455 02/02/16 0425 02/03/16 0443  NA 149* 151* 151*  K 3.4* 2.6* 2.0*  CL 113* 113* 110  CO2 29 31 33*  CREATININE 1.92* 1.71* 1.63*   LFT  Recent Labs  02/02/16 0425 02/03/16 0443  PROT 5.7* 5.9*  AST 29 35  ALT 13* 14*  ALKPHOS 62 66  BILITOT 2.4* 2.4*   PT/INR  Recent Labs  02/01/16 0455 02/02/16 0425 02/03/16 0443  LABPROT 19.5* 19.0* 18.7*  INR 1.63 1.58 1.54   PANCREAS No results for input(s): LIPASE in the last 72 hours.       Studies/Results: [place] Paracentesis  Result Date: 02/01/2016 INDICATION: Cirrhosis by imaging, CHF, renal insufficiency, ascites; request made for diagnostic and therapeutic paracentesis up to 7 liters. EXAM: ULTRASOUND GUIDED DIAGNOSTIC AND THERAPEUTIC PARACENTESIS MEDICATIONS: None. COMPLICATIONS: None immediate. PROCEDURE: Informed written consent was obtained from the patient after a discussion of the risks, benefits and alternatives to treatment. A timeout was performed  prior to the initiation of the procedure. Initial ultrasound scanning demonstrates a large amount of ascites within the left lower abdominal quadrant. The left lower abdomen was prepped and draped in the usual sterile fashion. 1% lidocaine was used for local anesthesia. Following this, a Yueh catheter was introduced. An ultrasound image was saved for documentation purposes. The paracentesis was performed. The catheter was removed and a dressing was applied. The patient tolerated the procedure well without immediate post procedural complication. FINDINGS: A total of approximately 7 liters of slightly hazy, amber fluid was removed. Samples were sent to the laboratory as requested by the clinical team. IMPRESSION: Successful ultrasound-guided diagnostic and therapeutic paracentesis yielding 7 liters of peritoneal fluid. Read by: Jeananne RamaKevin Allred, PA-C Electronically Signed   By: Jolaine ClickArthur  Hoss M.D.   On: 02/01/2016 14:08    Medications: I have reviewed the patient's current medications.  Assessment/Plan: 1. G.I. bleeding. Only significant finding esophagitis on EGD. Specifically no varices were seen. Appears to grossly be stable. Has received one dose Feraheme and if hemoglobin drops further I would go ahead and give him another dose. 2. 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 SBP. Based on 400 wbc's on paracentesis fluid mostly polys. Patient has been on antibiotics ever since he was admitted. Culture was still pending. He has been on full liquids because of his esophagitis. At this point I think we could go ahead and advanced to mechanically soft diet. We will try to follow him every other day.  Neiswender JR,Dalena Plantz L  02/03/2016, 8:10 AM  This note was created using voice recognition software. Minor errors may Have occurred unintentionally.  Pager: [phone] If no answer or after hours call [phone]

## 2016-02-03 NOTE — Progress Notes (Signed)
    Subjective:  Denies CP or dyspnea   Objective:  Vitals:   02/03/16 0528 02/03/16 0600 02/03/16 0700 02/03/16 0800  BP: (!) 100/40 (!) 102/45 (!) 91/38   Pulse: (!) 114 74 73   Resp: 15 17 16    Temp:    98.3 F (36.8 C)  TempSrc:    Oral  SpO2: 100% 100% 100%   Weight:      Height:        Intake/Output from previous day:  Intake/Output Summary (Last 24 hours) at 02/03/16 1149 Last data filed at 02/03/16 0500  Gross per 24 hour  Intake              230 ml  Output             5100 ml  Net            -4870 ml    Physical Exam: Physical exam: Well-developed chronically ill appearing in no acute distress.  Skin is warm and dry.  HEENT is normal.  Neck is supple.  Chest is clear to auscultation with normal expansion.  Cardiovascular exam irregular Abdominal exam positive ascites Extremities show 4+ edema. Diffuse anasarca neuro grossly intact    Lab Results: Basic Metabolic Panel:  Recent Labs  96/07/5408/30/17 0425 02/03/16 0443  NA 151* 151*  K 2.6* 2.0*  CL 113* 110  CO2 31 33*  GLUCOSE 132* 128*  BUN 57* 48*  CREATININE 1.71* 1.63*  CALCIUM 8.3* 8.1*  MG  --  1.5*   CBC:  Recent Labs  02/02/16 0425 02/03/16 0443  WBC 11.8* 12.2*  HGB 9.6* 9.0*  HCT 28.3* 27.2*  MCV 95.6 95.1  PLT 165 160    Assessment/Plan:  This is a 75 y.o.male has apast medical history significant for A-fib, chronic congestive heart failure with EF 45-50% (12/2015), hypertension, dyslipidemia and mild right heart failure with moderate pulmonary hypertension, presents with fall in his bathtub. Found to have acute renal failure and mild rhabomyolysis. Went into septic shock and had acute GIB as well. Now off warfarin. Troponin mildly elevated at 0.25, 0.36.   1 Atrial fibrillation-rate is controlled on no medications. Anticoagulation on hold given recent GI bleed.  2 acute combined systolic/diastolic congestive heart failure-patient is markedly volume overloaded (but  improving) and there is probable component of third spacing. Continue Lasix as tolerated by renal function. BUN and creatinine improving with diuresis.  3 hypernatremia-would liberalize free H2O.  4 shock-resolved.   5 GI bleed-management per internal medicine and gastroenterology.  6 Hypokalemia-supplement    Olga MillersBrian Julia Kulzer 02/03/2016, 11:49 AM

## 2016-02-03 NOTE — Progress Notes (Signed)
PROGRESS NOTE    EDWORD CU  ZOX:[id number] DOB: 1940-11-28 DOA: 01/12/2016 PCP: Georgianne Fick, MD    Brief Narrative:  75 y.o.malewith a history of AFib on coumadin, chronic HFrEF (45-50%) and right heart failure, pulmonary HTN who was found down for 3 days in a bathtub. He stated he was too weak to get up and had not taken any po nor urinated or defecated during that time. On arrival he was alert and oriented, dry mucous membranes and had anasarca with leg swelling R>L with evidence of cellulitis on the right leg. Abdomen was tympanitic and distended. Rectal temperature was 96.63F, glucose 26mg /dl, hypotensive and tachycardic saturating 90% on room air, 99% with 2L O2. Labs were significant for creatinine 3.32, BUN 86, bicarbonate 21, CK 528, troponin 0.36, BNP 2,155. Abdomen with chest x-ray showed abnormal increased left hilar and retrocardiac density in the chest, findings suggestive of ascites and colonic ileus. INR on arrival was supratherapeutic at 6, jumped to 10 10/21. Oral vitamin K given during the day. CT imaging showed cirrhosis (no known history of this) and large volume ascites.   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 NG tube placed, boluses, octreotide and protonix gtt started, IV vitamin K was given. BP remained low despite NS, likely third spacing IV fluid. Pt is DNR and a Jehovah's witness, refusing blood transfusions. Levophed started. After discussion with patient, verbal consent for "blood substitutes" including albumin and Kcentra was obtained, and these were given. INR has normalized and no further signs of bleeding. Levophed was weaned off 10/23.  Assessment & Plan:   Principal Problem:   Sepsis (HCC) Active Problems:   AKI (acute kidney injury) (HCC)   Urinary retention   Prolonged Q-T interval on ECG   Atrial fibrillation (HCC)   Chronic combined  systolic and diastolic CHF (congestive heart failure) (HCC)   Cellulitis and abscess of right lower extremity   Pressure injury of skin   Rhabdomyolysis   GI bleeding   Coagulopathy (HCC)   Lactic acidosis   Elevated troponin   Shock (HCC)   Hypoalbuminemia   RBBB   Bacteremia   Acute respiratory failure (HCC)   Abdominal distention   Right heart failure   Ascites   Dysphagia, oropharyngeal phase   Hypovolemic shock due to hematemesis with acute blood loss anemia:   -Likely secondary to liver disease -Hemoglobin overall stable, slightly decreased to 9.0. Will repeat CBC in AM -GI following. Patient is s/p EGD on 10/25 with findings of gastritis, healing duodenal ulcer, no varices.  -Patient on PPI  Severe sepsis from Morganella bacteremia/RLE cellulitis:  -Lactic acid had normalized -Patient found to have 1/2 morbanella species in blood cultures that are sensitive to Rocephin.  -Plan to continue rocephin. Discussed with GI  Acute renal failure/anasarca ?hepatorenal syndrome:  -Patient presented with a creatinine of 3.32, baseline creatinine is uncertain -Presenting renal failure possibly secondary to rhabdomyolysis vs acute heart failure -Cr continues to improve with diuresis, currently 1.63 -Repeat CMP in AM  Chronic systolic heart failure and right heart failure:  -Patient noted to have EF of 45-50% with mild LVH -Appreciate assistance by Cardiology. Recs noted. -Patient is continued on lasix -Pt is now s/p large volume paracentesis per below -remains edematous, however appears to be improving  Atrial fibrillation:  -Remains rate controlled this AM -Per cardiology -Patient remains off beta blocker secondary to borderline low blood pressure -Patient off anticoagulation secondary  to recent acute blood loss anemia  Acute respiratory failure:  -Patient now on [facility]2LNC -Much improved from prior -Cont to wean O2 as tolerated  Colonic ileus:  -Tolerating  diet -Continue to advance as tolerated -Improved  Ascites, possible SBP: -Large volume of ascites noted on recent imaging studies. -After correcting coagulopathy, patient ultimately underwent large-volume paracentesis on 10/29 yielding 7L ascitic fluid -Fluid analysis suggestive of SBP. Discussed case with GI who recommends continuing rocephin  Mild rhabdomyolysis:  -CK improved -Normalized  Severe deconditioning:  -PT consulted. Recommendations for SNF at time of discharge  Prolonged QTc interval:  -Presenting QTC of 537. -Medically stable at this time -Repeat electrolytes in AM and cont to correct as needed  Urinary retention:  -Noted on earlier CT abd -patient is continued with foley catheter  Coagulopathy -INR initially difficult to normalize -INR ultimately improved with high dose vit K -Coumadin remains on hold -Will repeat INR in AM  Hypokalemia -Low this AM -Replacement ordered this admission -Magnesium level ordered, found to be <2 - will replace  Hypomagnesemia  -Will replace -Continue to replace lytes as needed  DVT prophylaxis: SCDs Code Status: DO NOT RESUSCITATE Family Communication: Patient in room, family not at bedside Disposition Plan: Uncertain at this time  Consultants:   Gastroenterology  Cardiology  Pulmonary critical care  Discussed case with hematology (Dr. Shirline FreesMohammed)  Procedures:   Foley placement by Dr. McDiarmid [facility] on 06-11-15  Right lower extremity Doppler ultrasound 06-11-15 negative for DVT  Right upper extremity Doppler ultrasound on 06-11-15 negative for SVT or DVT  Patient developed hematemesis on 01/25/2016, initiated Levothroid until 01/26/2016  EGD by gastroenterology on 01/12/2016  Ultrasound-guided paracentesis by IR on 02/01/2016 yielding 7 L of ascitic fluid  Antimicrobials: Anti-infectives    Start     Dose/Rate Route Frequency Ordered Stop   01/26/16 1000  cefTRIAXone (ROCEPHIN) 2 g in  dextrose 5 % 50 mL IVPB     2 g 100 mL/hr over 30 Minutes Intravenous Every 24 hours 01/26/16 0849     01/25/16 1600  vancomycin (VANCOCIN) 1,500 mg in sodium chloride 0.9 % 500 mL IVPB  Status:  Discontinued     1,500 mg 250 mL/hr over 120 Minutes Intravenous Every 48 hours 2015/08/14 1450 01/24/16 1048   01/24/16 1600  vancomycin (VANCOCIN) 1,500 mg in sodium chloride 0.9 % 500 mL IVPB  Status:  Discontinued     1,500 mg 250 mL/hr over 120 Minutes Intravenous Every 48 hours 01/24/16 1048 01/26/16 0816   2015/08/14 2200  piperacillin-tazobactam (ZOSYN) IVPB 2.25 g  Status:  Discontinued     2.25 g 100 mL/hr over 30 Minutes Intravenous Every 6 hours 2015/08/14 1450 01/26/16 0849   2015/08/14 1445  vancomycin (VANCOCIN) 1,500 mg in sodium chloride 0.9 % 500 mL IVPB  Status:  Discontinued     1,500 mg 250 mL/hr over 120 Minutes Intravenous STAT 2015/08/14 1440 01/24/16 1048   2015/08/14 1430  piperacillin-tazobactam (ZOSYN) IVPB 3.375 g     3.375 g 100 mL/hr over 30 Minutes Intravenous  Once 2015/08/14 1420 2015/08/14 1508   2015/08/14 1430  vancomycin (VANCOCIN) IVPB 1000 mg/200 mL premix  Status:  Discontinued     1,000 mg 200 mL/hr over 60 Minutes Intravenous  Once 2015/08/14 1420 2015/08/14 1608      Subjective: Without complaints this AM  Objective: Vitals:   02/03/16 0528 02/03/16 0600 02/03/16 0700 02/03/16 0800  BP: (!) 100/40 (!) 102/45 (!) 91/38   Pulse: (!) 114 74 73  Resp: 15 17 16    Temp:    98.3 F (36.8 C)  TempSrc:    Oral  SpO2: 100% 100% 100%   Weight:      Height:        Intake/Output Summary (Last 24 hours) at 02/03/16 1402 Last data filed at 02/03/16 1210  Gross per 24 hour  Intake              160 ml  Output             6900 ml  Net            -6740 ml   Filed Weights   02/01/16 1000 02/02/16 0500 02/03/16 0410  Weight: 129.6 kg (285 lb 11.5 oz) 113.9 kg (251 lb 1.7 oz) 113.9 kg (251 lb 1.7 oz)    Examination:  General exam:Laying in bed, in nad,  conversant Respiratory system: Normal resp effort, no audible wheezing Cardiovascular system: regular rhythm, s1, s2 Gastrointestinal system: Obese, decreased bs, less distended Central nervous system: cn2-12 grossly intact, strength intact Extremities: No cyanosis, no joint deformities Skin: Normal skin turgor, no pallor Psychiatry: Normal affect, no auditory/visual hallucinations  Data Reviewed: I have personally reviewed following labs and imaging studies  CBC:  Recent Labs Lab 01/30/16 0500 01/31/16 0401 02/01/16 0455 02/02/16 0425 02/03/16 0443  WBC 9.6 9.7 11.6* 11.8* 12.2*  HGB 9.8* 10.0* 9.8* 9.6* 9.0*  HCT 29.8* 30.3* 29.7* 28.3* 27.2*  MCV 95.8 95.9 96.4 95.6 95.1  PLT 133* 159 181 165 160   Basic Metabolic Panel:  Recent Labs Lab 01/04/2016 0348  01/30/16 0500 01/31/16 0401 02/01/16 0455 02/02/16 0425 02/03/16 0443  NA 147*  < > 149* 149* 149* 151* 151*  K 3.3*  < > 3.4* 3.3* 3.4* 2.6* 2.0*  CL 114*  < > 115* 114* 113* 113* 110  CO2 26  < > 28 29 29 31  33*  GLUCOSE 122*  < > 125* 121* 151* 132* 128*  BUN 96*  < > 81* 75* 66* 57* 48*  CREATININE 2.78*  < > 2.33* 1.95* 1.92* 1.71* 1.63*  CALCIUM 8.5*  < > 8.6* 8.7* 8.6* 8.3* 8.1*  MG 2.1  --   --   --   --   --  1.5*  < > = values in this interval not displayed. GFR: Estimated Creatinine Clearance: 50.2 mL/min (by C-G formula based on SCr of 1.63 mg/dL (H)). Liver Function Tests:  Recent Labs Lab 01/06/2016 0348 01/29/16 0435 02/02/16 0425 02/03/16 0443  AST 21 22 29  35  ALT 17 17 13* 14*  ALKPHOS 49 49 62 66  BILITOT 2.3* 2.7* 2.4* 2.4*  PROT 6.2* 6.2* 5.7* 5.9*  ALBUMIN 2.5* 2.4* 2.3* 2.4*   No results for input(s): LIPASE, AMYLASE in the last 168 hours. No results for input(s): AMMONIA in the last 168 hours. Coagulation Profile:  Recent Labs Lab 01/30/16 0500 01/31/16 0401 02/01/16 0455 02/02/16 0425 02/03/16 0443  INR 4.45* 4.53* 1.63 1.58 1.54   Cardiac Enzymes:  Recent Labs Lab  01/30/16 2201 01/31/16 0401 01/31/16 1034  TROPONINI 0.14* 0.13* 0.12*   BNP (last 3 results) No results for input(s): PROBNP in the last 8760 hours. HbA1C: No results for input(s): HGBA1C in the last 72 hours. CBG:  Recent Labs Lab 02/01/16 1207 02/01/16 1652 02/01/16 1938 02/03/16 0806 02/03/16 1155  GLUCAP 126* 124* 94 135* 114*   Lipid Profile: No results for input(s): CHOL, HDL, LDLCALC, TRIG, CHOLHDL, LDLDIRECT in the  last 72 hours. Thyroid Function Tests: No results for input(s): TSH, T4TOTAL, FREET4, T3FREE, THYROIDAB in the last 72 hours. Anemia Panel: No results for input(s): VITAMINB12, FOLATE, FERRITIN, TIBC, IRON, RETICCTPCT in the last 72 hours. Sepsis Labs: No results for input(s): PROCALCITON, LATICACIDVEN in the last 168 hours.  Recent Results (from the past 240 hour(s))  Gram stain     Status: None   Collection Time: 02/01/16 10:03 AM  Result Value Ref Range Status   Specimen Description PARACENTESIS  Final   Special Requests NONE  Final   Gram Stain   Final    MODERATE WBC PRESENT, PREDOMINANTLY PMN NO ORGANISMS SEEN Performed at [facility]    Report Status 02/01/2016 FINAL  Final     Radiology Studies: No results found.  Scheduled Meds: . atorvastatin  40 mg Oral q1800  . cefTRIAXone (ROCEPHIN)  IV  2 g Intravenous Q24H  . furosemide  60 mg Intravenous BID  . lidocaine  1 application Urethral Once  . pantoprazole  40 mg Oral BID AC  . sodium chloride flush  10 mL Intravenous Q8H  . sucralfate  1 g Oral TID WC & HS   Continuous Infusions:     LOS: 11 days   Vincent Gibson, Scheryl Marten, MD [facility] Pager [phone]  If 7PM-7AM, please contact night-coverage [email or web address] Password [email or web address] 02/03/2016, 2:02 PM

## 2016-02-04 DIAGNOSIS — A400 Sepsis due to streptococcus, group A: Secondary | ICD-10-CM

## 2016-02-04 LAB — PREPARE FRESH FROZEN PLASMA
UNIT DIVISION: 0
Unit division: 0

## 2016-02-04 LAB — GLUCOSE, CAPILLARY: GLUCOSE-CAPILLARY: 104 mg/dL — AB (ref 65–99)

## 2016-02-04 LAB — COMPREHENSIVE METABOLIC PANEL
ALK PHOS: 68 U/L (ref 38–126)
ALT: 13 U/L — AB (ref 17–63)
AST: 42 U/L — AB (ref 15–41)
Albumin: 2 g/dL — ABNORMAL LOW (ref 3.5–5.0)
Anion gap: 5 (ref 5–15)
BILIRUBIN TOTAL: 2 mg/dL — AB (ref 0.3–1.2)
BUN: 40 mg/dL — AB (ref 6–20)
CALCIUM: 7.5 mg/dL — AB (ref 8.9–10.3)
CHLORIDE: 108 mmol/L (ref 101–111)
CO2: 34 mmol/L — ABNORMAL HIGH (ref 22–32)
CREATININE: 1.78 mg/dL — AB (ref 0.61–1.24)
GFR, EST AFRICAN AMERICAN: 41 mL/min — AB (ref >60)
GFR, EST NON AFRICAN AMERICAN: 36 mL/min — AB (ref >60)
Glucose, Bld: 125 mg/dL — ABNORMAL HIGH (ref 65–99)
Potassium: <2 mmol/L — CL (ref 3.5–5.1)
Sodium: 147 mmol/L — ABNORMAL HIGH (ref 135–145)
TOTAL PROTEIN: 5.5 g/dL — AB (ref 6.5–8.1)

## 2016-02-04 LAB — CBC
HCT: 26.3 % — ABNORMAL LOW (ref 39.0–52.0)
Hemoglobin: 8.8 g/dL — ABNORMAL LOW (ref 13.0–17.0)
MCH: 31.9 pg (ref 26.0–34.0)
MCHC: 33.5 g/dL (ref 30.0–36.0)
MCV: 95.3 fL (ref 78.0–100.0)
PLATELETS: 164 10*3/uL (ref 150–400)
RBC: 2.76 MIL/uL — ABNORMAL LOW (ref 4.22–5.81)
RDW: 17.5 % — AB (ref 11.5–15.5)
WBC: 12.2 10*3/uL — AB (ref 4.0–10.5)

## 2016-02-04 LAB — PROTIME-INR
INR: 1.64
PROTHROMBIN TIME: 19.6 s — AB (ref 11.4–15.2)

## 2016-02-04 LAB — MAGNESIUM: MAGNESIUM: 1.9 mg/dL (ref 1.7–2.4)

## 2016-02-04 MED ORDER — LIP MEDEX EX OINT
TOPICAL_OINTMENT | CUTANEOUS | Status: AC
  Administered 2016-02-04: 1
  Filled 2016-02-04: qty 7

## 2016-02-04 MED ORDER — POTASSIUM CHLORIDE CRYS ER 20 MEQ PO TBCR
60.0000 meq | EXTENDED_RELEASE_TABLET | Freq: Three times a day (TID) | ORAL | Status: DC
  Administered 2016-02-04 (×3): 60 meq via ORAL
  Filled 2016-02-04 (×3): qty 3

## 2016-02-04 MED ORDER — POTASSIUM CHLORIDE 10 MEQ/100ML IV SOLN
10.0000 meq | INTRAVENOUS | Status: AC
  Administered 2016-02-04 (×6): 10 meq via INTRAVENOUS
  Filled 2016-02-04 (×6): qty 100

## 2016-02-04 MED ORDER — POTASSIUM CHLORIDE CRYS ER 20 MEQ PO TBCR: 40.0000 meq | EXTENDED_RELEASE_TABLET | Freq: Three times a day (TID) | ORAL | Status: DC

## 2016-02-04 NOTE — Progress Notes (Signed)
OT Cancellation Note  Patient Details Name: Vincent HatchetJohn W Gibson MRN: [id number] DOB: 05-Apr-1941   Cancelled Treatment:    Reason Eval/Treat Not Completed: Medical issues which prohibited therapy.  Pt's K+ < 2.  Will check back another day.  Eshal Propps 02/04/2016, 1:14 PM  Marica OtterMaryellen Bron Snellings, OTR/L [phone] 02/04/2016

## 2016-02-04 NOTE — Progress Notes (Signed)
PT Cancellation Note  Patient Details Name: Vincent HatchetJohn W Gibson MRN: [id number] DOB: 12-09-1940   Cancelled Treatment:     consulted with LPT and per chart review poor lab values esp K+ level, HgB and low BP's .  Will hold PT for today.     Felecia ShellingLori Chetan Mehring  PTA [facility]  Acute  Rehab Pager      [phone]

## 2016-02-04 NOTE — Progress Notes (Signed)
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 He stated he was too weak to get up and had not taken any po nor urinated or defecated during that time. On arrival he was alert and oriented, dry mucous membranes and had anasarca with leg swelling R>L with evidence of cellulitis on the right leg. Abdomen was tympanitic and distended. Rectal temperature was 96.70F, glucose 26mg /dl, hypotensive and tachycardic saturating 90% on room air, 99% with 2L O2. Labs were significant for creatinine 3.32, BUN 86, bicarbonate 21, CK 528, troponin 0.36, BNP 2,155. Abdomen with chest x-ray showed abnormal increased left hilar and retrocardiac density in the chest, findings suggestive of ascites and colonic ileus. INR on arrival was supratherapeutic at 6, jumped to 10 10/21. Oral vitamin K given during the day. CT imaging showed cirrhosis (no known history of this) and large volume ascites.   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 NG tube placed, boluses, octreotide and protonix gtt started, IV vitamin K was given. BP remained low despite NS, likely third spacing IV fluid. Pt is DNR and a Jehovah's witness, refusing blood transfusions. Levophed started. After discussion with patient, verbal consent for "blood substitutes" including albumin and Kcentra was obtained, and these were given. INR has normalized and no further signs of bleeding. Levophed was weaned off 10/23.  Assessment & Plan:   Principal Problem:   Sepsis (HCC) Active Problems:   AKI (acute kidney injury) (HCC)   Urinary retention   Prolonged Q-T interval on ECG   Atrial fibrillation (HCC)   Chronic combined  systolic and diastolic CHF (congestive heart failure) (HCC)   Cellulitis and abscess of right lower extremity   Pressure injury of skin   Rhabdomyolysis   GI bleeding   Coagulopathy (HCC)   Lactic acidosis   Elevated troponin   Shock (HCC)   Hypoalbuminemia   RBBB   Bacteremia   Acute respiratory failure (HCC)   Abdominal distention   Right heart failure   Ascites   Dysphagia, oropharyngeal phase    Hypovolemic/septic shock , improving  hematemesis with acute blood loss anemia also improving Hemoglobin slowly trending down but no active bleeding -GI following. Patient is s/p EGD on 10/25 with findings of gastritis, healing duodenal ulcer, no varices.  -Patient on PPI Probable cirrhosis with coagulopathy also contributing, INR 1.6  Severe sepsis from Morganella bacteremia/RLE cellulitis:  -Lactic acid had normalized -Patient found to have 1/2 morbanella species in blood cultures that are sensitive to Rocephin.  Rocephin was started on 10/23, will continue for a total of 2 weeks  Atrial fibrillation-rate controlled, not a candidate for anticoagulation given GI bleed  Hypernatremia-improving  Acute renal failure/anasarca ?hepatorenal syndrome:  -Patient presented with a creatinine of 3.32, baseline creatinine is uncertain -Presenting renal failure possibly secondary to rhabdomyolysis vs acute heart failure -Cr continues to improve with diuresis, currently 1.78  Severe hypokalemia-replete aggressively and recheck, magnesium 1.9  Chronic systolic heart failure and right heart failure:  -Patient noted to have EF of 45-50% with mild LVH -Appreciate assistance by Cardiology. Recs noted. -Patient is continued on lasix -Pt is now s/p large volume paracentesis per below -remains edematous, however appears to be improving  Atrial fibrillation:  -Remains rate controlled this AM -Per cardiology -Patient remains off beta blocker secondary to borderline low blood  pressure -Patient off anticoagulation secondary to recent acute blood loss anemia  Acute respiratory failure:  -Patient now on [facility]2LNC -Much improved from prior -Cont to wean O2 as tolerated  Colonic ileus:  -Tolerating diet -Continue to advance as tolerated -Improved  Ascites, possible SBP: -Large volume of ascites noted on recent imaging studies. -After correcting coagulopathy, patient ultimately underwent large-volume paracentesis on 10/29 yielding 7L ascitic fluid -Fluid analysis suggestive of SBP. Discussed case with GI who recommends continuing rocephin  Mild rhabdomyolysis:  -CK improved -Normalized  Severe deconditioning:  -PT consulted. Recommendations for SNF at time of discharge  Prolonged QTc interval:  -Presenting QTC of 537. -Medically stable at this time -Repeat electrolytes in AM and cont to correct as needed  Urinary retention:  -Noted on earlier CT abd -patient is continued with foley catheter  Coagulopathy -INR initially difficult to normalize -INR ultimately improved with high dose vit K -Coumadin remains on hold       DVT prophylaxis: SCDs Code Status: DO NOT RESUSCITATE Family Communication: Patient in room, family not at bedside Disposition Plan: Mobilize, transfer to telemetry Today's  Consultants:   Gastroenterology  Cardiology  Pulmonary critical care  Discussed case with hematology (Dr. Shirline FreesMohammed)  Procedures:   Foley placement by Dr. McDiarmid Urology on 01/30/2016  Right lower extremity Doppler ultrasound 01/21/2016 negative for DVT  Right upper extremity Doppler ultrasound on 01/22/2016 negative for SVT or DVT  Patient developed hematemesis on 01/25/2016, initiated Levothroid until 01/26/2016  EGD by gastroenterology on 01/10/2016  Ultrasound-guided paracentesis by IR on 02/01/2016 yielding 7 L of ascitic fluid  Antimicrobials:  Vancomycin 10/20-10/23 Rocephin 10/23> Zosyn  10/20-10/23   Subjective: Frustrated due to delay in breakfast this morning, no active bleeding, breathing seems to be okay, denies any chest pain or shortness of breath  Objective: Vitals:   02/04/16 0307 02/04/16 0315 02/04/16 0400 02/04/16 0500  BP:   (!) 99/43 (!) 96/44  Pulse:   71 68  Resp:   11 16  Temp: 98.6 F (37 C)     TempSrc: Oral     SpO2:   99% 100%  Weight:  105 kg (231 lb 7.7 oz)    Height:        Intake/Output Summary (Last 24 hours) at 02/04/16 0815 Last data filed at 02/04/16 0650  Gross per 24 hour  Intake              260 ml  Output             2350 ml  Net            -2090 ml   Filed Weights   02/02/16 0500 02/03/16 0410 02/04/16 0315  Weight: 113.9 kg (251 lb 1.7 oz) 113.9 kg (251 lb 1.7 oz) 105 kg (231 lb 7.7 oz)    Examination:  General exam:Laying in bed, in nad, conversant Respiratory system: Normal resp effort, no audible wheezing Cardiovascular system: regular rhythm, s1, s2 Gastrointestinal system: Obese, decreased bs, less distended Central nervous system: cn2-12 grossly intact, strength intact Extremities: No cyanosis, no joint deformities Skin: Normal skin turgor, no pallor Psychiatry: Normal affect, no auditory/visual hallucinations  Data Reviewed: I have personally reviewed following labs and imaging studies  CBC:  Recent Labs Lab 01/31/16 0401 02/01/16 0455 02/02/16 0425 02/03/16 0443 02/04/16 0518  WBC 9.7 11.6* 11.8* 12.2* 12.2*  HGB 10.0*  9.8* 9.6* 9.0* 8.8*  HCT 30.3* 29.7* 28.3* 27.2* 26.3*  MCV 95.9 96.4 95.6 95.1 95.3  PLT 159 181 165 160 164   Basic Metabolic Panel:  Recent Labs Lab 01/31/16 0401 02/01/16 0455 02/02/16 0425 02/03/16 0443 02/04/16 0518  NA 149* 149* 151* 151* 147*  K 3.3* 3.4* 2.6* 2.0* <2.0*  CL 114* 113* 113* 110 108  CO2 29 29 31  33* 34*  GLUCOSE 121* 151* 132* 128* 125*  BUN 75* 66* 57* 48* 40*  CREATININE 1.95* 1.92* 1.71* 1.63* 1.78*  CALCIUM 8.7* 8.6* 8.3* 8.1* 7.5*  MG  --    --   --  1.5* 1.9   GFR: Estimated Creatinine Clearance: 44.2 mL/min (by C-G formula based on SCr of 1.78 mg/dL (H)). Liver Function Tests:  Recent Labs Lab 01/29/16 0435 02/02/16 0425 02/03/16 0443 02/04/16 0518  AST 22 29 35 42*  ALT 17 13* 14* 13*  ALKPHOS 49 62 66 68  BILITOT 2.7* 2.4* 2.4* 2.0*  PROT 6.2* 5.7* 5.9* 5.5*  ALBUMIN 2.4* 2.3* 2.4* 2.0*   No results for input(s): LIPASE, AMYLASE in the last 168 hours. No results for input(s): AMMONIA in the last 168 hours. Coagulation Profile:  Recent Labs Lab 01/31/16 0401 02/01/16 0455 02/02/16 0425 02/03/16 0443 02/04/16 0518  INR 4.53* 1.63 1.58 1.54 1.64   Cardiac Enzymes:  Recent Labs Lab 01/30/16 2201 01/31/16 0401 01/31/16 1034  TROPONINI 0.14* 0.13* 0.12*   BNP (last 3 results) No results for input(s): PROBNP in the last 8760 hours. HbA1C: No results for input(s): HGBA1C in the last 72 hours. CBG:  Recent Labs Lab 02/01/16 1652 02/01/16 1938 02/03/16 0806 02/03/16 1155 02/03/16 1630  GLUCAP 124* 94 135* 114* 123*   Lipid Profile: No results for input(s): CHOL, HDL, LDLCALC, TRIG, CHOLHDL, LDLDIRECT in the last 72 hours. Thyroid Function Tests: No results for input(s): TSH, T4TOTAL, FREET4, T3FREE, THYROIDAB in the last 72 hours. Anemia Panel: No results for input(s): VITAMINB12, FOLATE, FERRITIN, TIBC, IRON, RETICCTPCT in the last 72 hours. Sepsis Labs: No results for input(s): PROCALCITON, LATICACIDVEN in the last 168 hours.  Recent Results (from the past 240 hour(s))  Gram stain     Status: None   Collection Time: 02/01/16 10:03 AM  Result Value Ref Range Status   Specimen Description PARACENTESIS  Final   Special Requests NONE  Final   Gram Stain   Final    MODERATE WBC PRESENT, PREDOMINANTLY PMN NO ORGANISMS SEEN [facility]    Report Status 02/01/2016 FINAL  Final     Radiology Studies: No results found.  Scheduled Meds: . atorvastatin  40 mg Oral  q1800  . cefTRIAXone (ROCEPHIN)  IV  2 g Intravenous Q24H  . furosemide  60 mg Intravenous BID  . lidocaine  1 application Urethral Once  . pantoprazole  40 mg Oral BID AC  . potassium chloride  10 mEq Intravenous Q1 Hr x 6  . potassium chloride  60 mEq Oral TID  . sodium chloride flush  10 mL Intravenous Q8H  . sucralfate  1 g Oral TID WC & HS   Continuous Infusions:     LOS: 12 days   Richarda OverlieABROL,Ziair Penson, MD [facility] Pager [phone]  If 7PM-7AM, please contact night-coverage [email or web address] Password [email or web address] 02/04/2016, 8:15 AM

## 2016-02-04 NOTE — Progress Notes (Signed)
CSW assisting with d/c planning. SNF search as been initiate and bed offers provided. Pt will review SNF choices with his [employer] family. CSW cell # has been left with pt in case family has questions regarding facilities. CSW will remain available to assist with d/c planning needs.  Cori RazorJamie Eileen Croswell LCSW [phone]

## 2016-02-04 DEATH — deceased

## 2016-02-05 DIAGNOSIS — I509 Heart failure, unspecified: Secondary | ICD-10-CM

## 2016-02-05 DIAGNOSIS — I5031 Acute diastolic (congestive) heart failure: Secondary | ICD-10-CM

## 2016-02-05 LAB — COMPREHENSIVE METABOLIC PANEL
ALBUMIN: 1.9 g/dL — AB (ref 3.5–5.0)
ALT: 14 U/L — ABNORMAL LOW (ref 17–63)
ANION GAP: 6 (ref 5–15)
AST: 51 U/L — AB (ref 15–41)
Alkaline Phosphatase: 78 U/L (ref 38–126)
BUN: 43 mg/dL — AB (ref 6–20)
CHLORIDE: 106 mmol/L (ref 101–111)
CO2: 34 mmol/L — ABNORMAL HIGH (ref 22–32)
Calcium: 7.4 mg/dL — ABNORMAL LOW (ref 8.9–10.3)
Creatinine, Ser: 1.79 mg/dL — ABNORMAL HIGH (ref 0.61–1.24)
GFR calc Af Amer: 41 mL/min — ABNORMAL LOW (ref >60)
GFR, EST NON AFRICAN AMERICAN: 35 mL/min — AB (ref >60)
Glucose, Bld: 91 mg/dL (ref 65–99)
POTASSIUM: 2.2 mmol/L — AB (ref 3.5–5.1)
Sodium: 146 mmol/L — ABNORMAL HIGH (ref 135–145)
Total Bilirubin: 2.5 mg/dL — ABNORMAL HIGH (ref 0.3–1.2)
Total Protein: 5.6 g/dL — ABNORMAL LOW (ref 6.5–8.1)

## 2016-02-05 LAB — CBC
HEMATOCRIT: 23.5 % — AB (ref 39.0–52.0)
HEMOGLOBIN: 8 g/dL — AB (ref 13.0–17.0)
MCH: 32.1 pg (ref 26.0–34.0)
MCHC: 34 g/dL (ref 30.0–36.0)
MCV: 94.4 fL (ref 78.0–100.0)
Platelets: 171 10*3/uL (ref 150–400)
RBC: 2.49 MIL/uL — AB (ref 4.22–5.81)
RDW: 17.4 % — AB (ref 11.5–15.5)
WBC: 12.7 10*3/uL — AB (ref 4.0–10.5)

## 2016-02-05 LAB — C DIFFICILE QUICK SCREEN W PCR REFLEX
C DIFFICILE (CDIFF) INTERP: NOT DETECTED
C DIFFICILE (CDIFF) TOXIN: NEGATIVE
C Diff antigen: NEGATIVE

## 2016-02-05 LAB — MAGNESIUM: Magnesium: 1.8 mg/dL (ref 1.7–2.4)

## 2016-02-05 MED ORDER — LOPERAMIDE HCL 2 MG PO CAPS
2.0000 mg | ORAL_CAPSULE | Freq: Once | ORAL | Status: AC
  Administered 2016-02-05: 2 mg via ORAL
  Filled 2016-02-05: qty 1

## 2016-02-05 MED ORDER — SODIUM CHLORIDE 0.9 % IV SOLN
510.0000 mg | Freq: Once | INTRAVENOUS | Status: AC
  Administered 2016-02-05: 510 mg via INTRAVENOUS
  Filled 2016-02-05: qty 17

## 2016-02-05 MED ORDER — POTASSIUM CHLORIDE 20 MEQ/15ML (10%) PO SOLN
60.0000 meq | Freq: Four times a day (QID) | ORAL | Status: AC
  Administered 2016-02-05 (×3): 60 meq via ORAL
  Filled 2016-02-05 (×4): qty 45

## 2016-02-05 MED ORDER — POTASSIUM CHLORIDE CRYS ER 20 MEQ PO TBCR
60.0000 meq | EXTENDED_RELEASE_TABLET | Freq: Four times a day (QID) | ORAL | Status: DC
  Administered 2016-02-05: 60 meq via ORAL
  Filled 2016-02-05 (×2): qty 3

## 2016-02-05 NOTE — Progress Notes (Addendum)
02/05/16  [id number]  Notified Dr Susie CassetteAbrol of patients K+ 2.2 and patient had a large stool that appeared to have blood. Waiting response from MD.  [phone] MD responded and has placed orders per MD.

## 2016-02-05 NOTE — Progress Notes (Signed)
CRITICAL VALUE ALERT  Critical value received: Potassium 2.2  Date of notification:  02/05/16  Time of notification: 0814  Critical value read back:Yes.    Nurse who received alert:  Josephina ShihShanda Rohit Deloria, RN  MD notified (1st page): ABROL & pt. Primary nurse, Dekina  Time of first page:  0820

## 2016-02-05 NOTE — Progress Notes (Signed)
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 He stated he was too weak to get up and had not taken any po nor urinated or defecated during that time. On arrival he was alert and oriented, dry mucous membranes and had anasarca with leg swelling R>L with evidence of cellulitis on the right leg. Abdomen was tympanitic and distended. Rectal temperature was 96.78F, glucose 26mg /dl, hypotensive and tachycardic saturating 90% on room air, 99% with 2L O2. Labs were significant for creatinine 3.32, BUN 86, bicarbonate 21, CK 528, troponin 0.36, BNP 2,155. Abdomen with chest x-ray showed abnormal increased left hilar and retrocardiac density in the chest, findings suggestive of ascites and colonic ileus. INR on arrival was supratherapeutic at 6, jumped to 10 10/21. Oral vitamin K given during the day. CT imaging showed cirrhosis (no known history of this) and large volume ascites.   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 NG tube placed, boluses, octreotide and protonix gtt started, IV vitamin K was given. BP remained low despite NS, likely third spacing IV fluid. Pt is DNR and a Jehovah's witness, refusing blood transfusions. Levophed started. After discussion with patient, verbal consent for "blood substitutes" including albumin and Kcentra was obtained, and these were given. INR has normalized and no further signs of bleeding. Levophed was weaned off 10/23. 10/29 -patient underwent paracentesis with removal of 7 L of fluid   Assessment & Plan:   Principal Problem:   Sepsis (HCC) Active Problems:   AKI (acute kidney injury) (HCC)   Urinary retention   Prolonged  Q-T interval on ECG   Atrial fibrillation (HCC)   Chronic combined systolic and diastolic CHF (congestive heart failure) (HCC)   Cellulitis and abscess of right lower extremity   Pressure injury of skin   Rhabdomyolysis   GI bleeding   Coagulopathy (HCC)   Lactic acidosis   Elevated troponin   Shock (HCC)   Hypoalbuminemia   RBBB   Bacteremia   Acute respiratory failure (HCC)   Abdominal distention   Right heart failure   Ascites   Dysphagia, oropharyngeal phase    Hypovolemic/septic shock , patient hemodynamically unstable, tachycardic hypotensive today hematemesis with acute blood loss anemia  has been stable Hemoglobin slowly trending down but no active bleeding -GI following. Patient is s/p EGD on 10/25 with findings of gastritis, healing duodenal ulcer, no varices.  -Patient on PPI Probable cirrhosis with coagulopathy also contributing, INR 1.6 Patient noted to be in A. fib with RVR at rest, cardiology following and will be adjusting patient's diuretics Continue stepdown  Cirrhosis/nonalcoholic fatty liver disease/recurrent ascites Paracentesis 10/29, 7 L were removed on 10/29 Patient appears to be more distended today and may need recurrent paracentesis after administration of albumin, if blood pressure improves. Would not do large volume paracentesis in the setting of low blood pressure  Severe sepsis from Morganella bacteremia/RLE cellulitis:  Continues to be hemodynamically unstable with atrial fibrillation with rapid ventricular response -Patient found to have 1/2 morbanella species in blood cultures that are sensitive to Rocephin.  Rocephin was started on 10/23, will continue for a total of 2 weeks Dr. Dulce Sellarutlaw recommended to continue antibiotics in the setting of GI bleeding/ascites-can transition  to oral Cipro when ready to discharge from the hospital  Atrial fibrillation with rapid ventricular response associated with hypotension-rate uncontrolled, cardiology  managing  -Patient remains off beta blocker secondary to borderline low blood pressure -Patient off anticoagulation secondary to recent acute blood loss anemia  Anemia of chronic disease/acute blood loss anemia-hemoglobin slowly has trended down, now 8.0+ repeat Feraheme Thrombocytopenia has resolved  Hypernatremia-improving  Acute renal failure/chronic kidney disease stage III, baseline creatinine 1.54/anasarca ?hepatorenal syndrome:  -Patient presented with a creatinine of 3.32, baseline creatinine around 1.54 -Presenting renal failure possibly secondary to rhabdomyolysis vs acute heart failure -Cr continues to improve with diuresis, currently 1.78>1.79, most likely this is new baseline    Severe hypokalemia-continue to replete potassium orally  Chronic systolic heart failure and right heart failure:  -Patient noted to have EF of 45-50% with mild LVH -Appreciate assistance by Cardiology. Recs noted. -Patient is continued on lasix per cardiology -Pt is now s/p large volume paracentesis per below -remains edematous, however appears to be improving  Acute respiratory failure:  -Patient now on [facility]2LNC -Much improved from prior -Cont to wean O2 as tolerated  Colonic ileus:  -Tolerating diet -Continue to advance as tolerated -Improved  Ascites, possible SBP: -Large volume of ascites noted on recent imaging studies. -After correcting coagulopathy, patient ultimately underwent large-volume paracentesis on 10/29 yielding 7L ascitic fluid -Fluid analysis suggestive of SBP. Discussed case with GI who recommends continuing rocephin  Mild rhabdomyolysis:  -CK improved -Normalized  Severe deconditioning:  -PT consulted. Recommendations for SNF at time of discharge  Prolonged QTc interval:  -Presenting QTC of 537. -Medically stable at this time -Repeat electrolytes in AM and cont to correct as needed  Urinary retention:  -Noted on earlier CT abd -patient is continued with  foley catheter  Coagulopathy -INR initially difficult to normalize -INR ultimately improved with high dose vit K -Coumadin remains on hold       DVT prophylaxis: SCDs Code Status: DO NOT RESUSCITATE Family Communication: Patient in room, family not at bedside Disposition Plan:  Hemodynamically unstable to be transferred out of stepdown    Consultants:   Gastroenterology  Cardiology  Pulmonary critical care  Discussed case with hematology (Dr. Shirline FreesMohammed)  Procedures:   Foley placement by Dr. McDiarmid Urology on Aug 26, 2015  Right lower extremity Doppler ultrasound Aug 26, 2015 negative for DVT  Right upper extremity Doppler ultrasound on Aug 26, 2015 negative for SVT or DVT  Patient developed hematemesis on 01/25/2016, initiated Levothroid until 01/26/2016  EGD by gastroenterology on 01/06/2016  Ultrasound-guided paracentesis by IR on 02/01/2016 yielding 7 L of ascitic fluid  Antimicrobials:  Vancomycin 10/20-10/23 Rocephin 10/23> Zosyn 10/20-10/23   Subjective: Remained hypotensive with tachycardia, noted to have atrial fibrillation with rapid ventricular response, abdomen is more distended  Objective: Vitals:   02/05/16 0400 02/05/16 0401 02/05/16 0500 02/05/16 0800  BP: (!) 97/54   (!) 85/45  Pulse: (!) 57   (!) 104  Resp: (!) 21   14  Temp:  98.3 F (36.8 C)    TempSrc:  Oral    SpO2: 100%   100%  Weight:   105.8 kg (233 lb 4 oz)   Height:        Intake/Output Summary (Last 24 hours) at 02/05/16 0837 Last data filed at 02/05/16 0300  Gross per 24 hour  Intake              720 ml  Output             3876  ml  Net            -3156 ml   Filed Weights   02/03/16 0410 02/04/16 0315 02/05/16 0500  Weight: 113.9 kg (251 lb 1.7 oz) 105 kg (231 lb 7.7 oz) 105.8 kg (233 lb 4 oz)    Examination:  General exam:Laying in bed, in nad, conversant Respiratory system: Normal resp effort, no audible wheezing Cardiovascular system: regular rhythm, s1,  s2 Gastrointestinal system: Massive ascites, decreased bs,  Central nervous system: cn2-12 grossly intact, strength intact Extremities: No cyanosis, no joint deformities Skin: Normal skin turgor, no pallor Psychiatry: Normal affect, no auditory/visual hallucinations  Data Reviewed: I have personally reviewed following labs and imaging studies  CBC:  Recent Labs Lab 02/01/16 0455 02/02/16 0425 02/03/16 0443 02/04/16 0518 02/05/16 0532  WBC 11.6* 11.8* 12.2* 12.2* 12.7*  HGB 9.8* 9.6* 9.0* 8.8* 8.0*  HCT 29.7* 28.3* 27.2* 26.3* 23.5*  MCV 96.4 95.6 95.1 95.3 94.4  PLT 181 165 160 164 171   Basic Metabolic Panel:  Recent Labs Lab 02/01/16 0455 02/02/16 0425 02/03/16 0443 02/04/16 0518 02/05/16 0532  NA 149* 151* 151* 147* 146*  K 3.4* 2.6* 2.0* <2.0* 2.2*  CL 113* 113* 110 108 106  CO2 29 31 33* 34* 34*  GLUCOSE 151* 132* 128* 125* 91  BUN 66* 57* 48* 40* 43*  CREATININE 1.92* 1.71* 1.63* 1.78* 1.79*  CALCIUM 8.6* 8.3* 8.1* 7.5* 7.4*  MG  --   --  1.5* 1.9  --    GFR: Estimated Creatinine Clearance: 44.1 mL/min (by C-G formula based on SCr of 1.79 mg/dL (H)). Liver Function Tests:  Recent Labs Lab 02/02/16 0425 02/03/16 0443 02/04/16 0518 02/05/16 0532  AST 29 35 42* 51*  ALT 13* 14* 13* 14*  ALKPHOS 62 66 68 78  BILITOT 2.4* 2.4* 2.0* 2.5*  PROT 5.7* 5.9* 5.5* 5.6*  ALBUMIN 2.3* 2.4* 2.0* 1.9*   No results for input(s): LIPASE, AMYLASE in the last 168 hours. No results for input(s): AMMONIA in the last 168 hours. Coagulation Profile:  Recent Labs Lab 01/31/16 0401 02/01/16 0455 02/02/16 0425 02/03/16 0443 02/04/16 0518  INR 4.53* 1.63 1.58 1.54 1.64   Cardiac Enzymes:  Recent Labs Lab 01/30/16 2201 01/31/16 0401 01/31/16 1034  TROPONINI 0.14* 0.13* 0.12*   BNP (last 3 results) No results for input(s): PROBNP in the last 8760 hours. HbA1C: No results for input(s): HGBA1C in the last 72 hours. CBG:  Recent Labs Lab 02/01/16 1938  02/03/16 0806 02/03/16 1155 02/03/16 1630 02/04/16 1524  GLUCAP 94 135* 114* 123* 104*   Lipid Profile: No results for input(s): CHOL, HDL, LDLCALC, TRIG, CHOLHDL, LDLDIRECT in the last 72 hours. Thyroid Function Tests: No results for input(s): TSH, T4TOTAL, FREET4, T3FREE, THYROIDAB in the last 72 hours. Anemia Panel: No results for input(s): VITAMINB12, FOLATE, FERRITIN, TIBC, IRON, RETICCTPCT in the last 72 hours. Sepsis Labs: No results for input(s): PROCALCITON, LATICACIDVEN in the last 168 hours.  Recent Results (from the past 240 hour(s))  Gram stain     Status: None   Collection Time: 02/01/16 10:03 AM  Result Value Ref Range Status   Specimen Description PARACENTESIS  Final   Special Requests NONE  Final   Gram Stain   Final    MODERATE WBC PRESENT, PREDOMINANTLY PMN NO ORGANISMS SEEN Performed at [facility]    Report Status 02/01/2016 FINAL  Final  Culture, body fluid-bottle     Status: None (Preliminary result)   Collection  Time: 02/01/16 10:03 AM  Result Value Ref Range Status   Specimen Description PARACENTESIS  Final   Special Requests NONE  Final   Culture   Final    NO GROWTH 4 DAYS Performed at [facility]    Report Status PENDING  Incomplete     Radiology Studies: No results found.  Scheduled Meds: . atorvastatin  40 mg Oral q1800  . cefTRIAXone (ROCEPHIN)  IV  2 g Intravenous Q24H  . furosemide  60 mg Intravenous BID  . lidocaine  1 application Urethral Once  . pantoprazole  40 mg Oral BID AC  . potassium chloride  60 mEq Oral QID  . sodium chloride flush  10 mL Intravenous Q8H  . sucralfate  1 g Oral TID WC & HS   Continuous Infusions:     LOS: 13 days   Richarda Overlie, MD [facility] Pager [phone]  If 7PM-7AM, please contact night-coverage [email or web address] Password [facility] 02/05/2016, 8:37 AM

## 2016-02-05 NOTE — Progress Notes (Signed)
Patient Name: Vincent Gibson Date of Encounter: 02/05/2016  Primary Cardiologist: Dr. Rennis GoldenHilty [facility](New)  Hospital Problem List     Principal Problem:   Sepsis [facility](HCC) Active Problems:   AKI (acute kidney injury) (HCC)   Urinary retention   Prolonged Q-T interval on ECG   Atrial fibrillation (HCC)   Chronic combined systolic and diastolic CHF (congestive heart failure) (HCC)   Cellulitis and abscess of right lower extremity   Pressure injury of skin   Rhabdomyolysis   GI bleeding   Coagulopathy (HCC)   Lactic acidosis   Elevated troponin   Shock (HCC)   Hypoalbuminemia   RBBB   Bacteremia   Acute respiratory failure (HCC)   Abdominal distention   Right heart failure   Ascites   Dysphagia, oropharyngeal phase     Subjective   No chest pain. Breathing better. LE edema much better.   Inpatient Medications    Scheduled Meds: . atorvastatin  40 mg Oral q1800  . cefTRIAXone (ROCEPHIN)  IV  2 g Intravenous Q24H  . furosemide  60 mg Intravenous BID  . lidocaine  1 application Urethral Once  . pantoprazole  40 mg Oral BID AC  . potassium chloride  60 mEq Oral TID  . sodium chloride flush  10 mL Intravenous Q8H  . sucralfate  1 g Oral TID WC & HS   Continuous Infusions:   PRN Meds: acetaminophen **OR** acetaminophen, Glycerin (Adult), metoCLOPramide (REGLAN) injection, nitroGLYCERIN, senna-docusate   Vital Signs    Vitals:   02/05/16 0200 02/05/16 0400 02/05/16 0401 02/05/16 0500  BP: (!) 95/36 (!) 97/54    Pulse: 96 (!) 57    Resp: 17 (!) 21    Temp:   98.3 F (36.8 C)   TempSrc:   Oral   SpO2: 100% 100%    Weight:    233 lb 4 oz (105.8 kg)  Height:        Intake/Output Summary (Last 24 hours) at 02/05/16 0750 Last data filed at 02/05/16 0300  Gross per 24 hour  Intake              820 ml  Output             3876 ml  Net            -3056 ml   Filed Weights   02/03/16 0410 02/04/16 0315 02/05/16 0500  Weight: 251 lb 1.7 oz (113.9 kg) 231 lb 7.7 oz (105  kg) 233 lb 4 oz (105.8 kg)    Physical Exam   GEN: Well nourished, well developed, in no acute distress. Chronically ill appearing HEENT: Grossly normal.  Neck: Supple, no JVD, carotid bruits, or masses. Cardiac: irreg irreg, tachy, no murmurs, rubs, or gallops. No clubbing, cyanosis, edema.  Radials/DP/PT 2+ and equal bilaterally.  Respiratory:  Respirations regular and unlabored, clear to auscultation bilaterally. GI: Soft, nontender, ++distended, BS + x 4. MS: no deformity or atrophy. Skin: warm and dry, no rash. Neuro:  Strength and sensation are intact. Psych: AAOx3.  Normal affect.  Labs    CBC  Recent Labs  02/04/16 0518 02/05/16 0532  WBC 12.2* 12.7*  HGB 8.8* 8.0*  HCT 26.3* 23.5*  MCV 95.3 94.4  PLT 164 171   Basic Metabolic Panel  Recent Labs  02/03/16 0443 02/04/16 0518  NA 151* 147*  K 2.0* <2.0*  CL 110 108  CO2 33* 34*  GLUCOSE 128* 125*  BUN 48* 40*  CREATININE 1.63*  1.78*  CALCIUM 8.1* 7.5*  MG 1.5* 1.9   Liver Function Tests  Recent Labs  02/03/16 0443 02/04/16 0518  AST 35 42*  ALT 14* 13*  ALKPHOS 66 68  BILITOT 2.4* 2.0*  PROT 5.9* 5.5*  ALBUMIN 2.4* 2.0*    Telemetry    afib with RVR. HRs elevated overnight and  - Personally Reviewed  ECG    afib with RVR with RBBB, HR low 100s currently - Personally Reviewed  Radiology    No results found.  Cardiac Studies   2D ECHO: 01/27/2016 LV EF: 55% -   60% Study Conclusions - Left ventricle: The cavity size was normal. There was severe   asymmetric hypertrophy of the septum. Systolic function was   normal. The estimated ejection fraction was in the range of 55%   to 60%. Wall motion was normal; there were no regional wall   motion abnormalities. Doppler parameters are consistent with   restrictive physiology, indicative of decreased left ventricular   diastolic compliance and/or increased left atrial pressure. - Ventricular septum: The contour showed diastolic flattening  and   systolic flattening. - Aortic valve: There was trivial regurgitation. - Mitral valve: There was mild regurgitation. - Left atrium: The atrium was mildly dilated. - Right ventricle: The cavity size was severely dilated. Systolic   function was moderately reduced. - Right atrium: The atrium was severely dilated. - Atrial septum: The septum bowed from right to left, consistent   with increased right atrial pressure. - Pulmonary arteries: Systolic pressure was severely increased. PA   peak pressure: 75 mm Hg (S). - Line: A venous catheter was visualized with its tip in the right   atrium. - Pericardium, extracardiac: A small pericardial effusion was   identified. Impressions: - Normal LV systolic function; severe asymmetric LVH; restrictive   filling; trace TR; mild LAE; mild MR; severe RAE and RVE;   moderate RV dysfunction; mild TR with severely elevated pulmonary   pressure; small pericardial effusion.   Patient Profile     This is a 75 y.o.male has apast medical history significant for A-fib, chronic combined S/D CHF (EF 45-50% (12/2015)), hypertension, dyslipidemia and mild right heart failure with moderate pulmonary hypertension who presented after a fall in his bathtub. Found to have acute renal failure and mild rhabomyolysis. Went into septic shock and had acute GIB as well. Now off warfarin. Troponin mildly elevated at 0.25, 0.36.   Assessment & Plan    1 Persistent Atrial fibrillation-rate is mildly elevated today but BP too low to start AVN blocking agents. We may need to consider amiodarone or dig. Anticoagulation on hold given recent GI bleed.  2 Acute combined  S/D CHF with right sided CHF and pulm HTN- patient was markedly volume overloaded (but improving) and there is probable component of third spacing. He was been on Lasix IV 60mg  BID. Net neg 19.4L. BUN creat pending today. If renal function worse would switch him to PO lasix 60mg  BID.   3 hypernatremia-would  liberalize free H2O.  4 shock-resolved.   5 GI bleed-management per internal medicine and gastroenterology.  6 Hypokalemia- < 2.0. Given 6 runs of IV K yesterday as well as oral supplementation. BMET pending today  7 Abdominal distension- belly more distended than previously. Per IM  Signed, Cline CrockKathryn Thompson, PA-C  02/05/2016, 7:50 AM  As above, patient seen and examined. Patient denies dyspnea. He is chronically ill-appearing and remains volume overloaded. 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 His blood pressure is decreased today. I will hold further diuresis today and recheck renal function tomorrow. His rate is approximately 100 but his blood pressure will not allow additional medications at this point. We will follow. I would like to avoid digoxin given his baseline renal insufficiency. However like to avoid amiodarone given his liver disease. His long-term prognosis appears to be poor. He may benefit from a palliative care consult.  Olga Millers, MD

## 2016-02-05 NOTE — Progress Notes (Addendum)
Vincent HatchetJohn W Gibson 3:05 PM  Subjective: Patient without any new complaints and is looking forward to getting better so he can go to rehabilitation and he is not having abdominal pain and is eating some but did have a little diarrhea this morning but has no other complaints  Objective: Vital signs stable afebrile no acute distress abdomen is soft nontender probably some ascites hemoglobin slight decrease other labs stable C. difficile was negative  Assessment: Multiple medical problems  Plan: If a repeat paracentesis is done would make sure you check cell count and differential and albumin again as well as Gram stain and culture just to be sure and we will continue to follow at a distance and call us sooner if any specific question or problem and consider decreasing pump inhibitors to once a day  [facility] LtdMAGOD,Latroya Ng E  Pager [phone] After 5PM or if no answer call [phone]

## 2016-02-05 NOTE — Progress Notes (Signed)
Pt. Had 12 beat run of VT followed by a 22 beat run of VT a few seconds later. Paged [facility] MD. Orders received. Will continue to monitor.  Noralyn PickMargaret R Dillard Pascal, RN

## 2016-02-05 NOTE — Progress Notes (Signed)
Date:  February 05, 2016 Chart reviewed for concurrent status and case management needs. Will continue to follow the patient for status change: Discharge Planning: following for needs Expected discharge date: [id number] Marcelle SmilingRhonda Davis, BSN, [place], [place]   [phone]

## 2016-02-05 NOTE — Progress Notes (Signed)
PT Cancellation Note  Patient Details Name: Vincent HatchetJohn W Gibson MRN: [id number] DOB: 1940/10/02   Cancelled Treatment:     HgB trending down, K+ remains low below parameters for activity tolerance.  Will continue to follow per MD orders for mobility.    Felecia ShellingLori Tiyona Desouza  PTA [facility]  Acute  Rehab Pager      [phone]

## 2016-02-05 NOTE — Progress Notes (Signed)
OT Cancellation Note  Patient Details Name: Vincent HatchetJohn W Gibson MRN: [id number] DOB: 1940/10/21   Cancelled Treatment:    Reason Eval/Treat Not Completed: Medical issues which prohibited therapy.  Pt with multiple medical issues, including decreased BP. Will check another time.  Mandie Crabbe 02/05/2016, 1:34 PM  Marica OtterMaryellen Aleese Kamps, OTR/L [phone] 02/05/2016

## 2016-02-05 NOTE — Progress Notes (Addendum)
Time of first page for potassium 2.2 ,  8:33 am , not whats documented in  RN note from 8:28 on 02/05/16    please correct documentation

## 2016-02-06 ENCOUNTER — Encounter (HOSPITAL_COMMUNITY): Admission: EM | Disposition: E | Payer: Self-pay | Source: Home / Self Care | Attending: Internal Medicine

## 2016-02-06 ENCOUNTER — Encounter (HOSPITAL_COMMUNITY): Payer: Self-pay | Admitting: Gastroenterology

## 2016-02-06 DIAGNOSIS — I4891 Unspecified atrial fibrillation: Secondary | ICD-10-CM

## 2016-02-06 DIAGNOSIS — J9601 Acute respiratory failure with hypoxia: Secondary | ICD-10-CM

## 2016-02-06 DIAGNOSIS — Z7189 Other specified counseling: Secondary | ICD-10-CM

## 2016-02-06 DIAGNOSIS — Z515 Encounter for palliative care: Secondary | ICD-10-CM

## 2016-02-06 HISTORY — PX: ESOPHAGOGASTRODUODENOSCOPY: SHX5428

## 2016-02-06 LAB — CBC
HEMATOCRIT: 17 % — AB (ref 39.0–52.0)
Hemoglobin: 5.9 g/dL — CL (ref 13.0–17.0)
MCH: 32.2 pg (ref 26.0–34.0)
MCHC: 34.7 g/dL (ref 30.0–36.0)
MCV: 92.9 fL (ref 78.0–100.0)
PLATELETS: 141 10*3/uL — AB (ref 150–400)
RBC: 1.83 MIL/uL — AB (ref 4.22–5.81)
RDW: 17.3 % — ABNORMAL HIGH (ref 11.5–15.5)
WBC: 12.2 10*3/uL — ABNORMAL HIGH (ref 4.0–10.5)

## 2016-02-06 LAB — CULTURE, BODY FLUID W GRAM STAIN -BOTTLE: Culture: NO GROWTH

## 2016-02-06 LAB — COMPREHENSIVE METABOLIC PANEL
ALBUMIN: 1.9 g/dL — AB (ref 3.5–5.0)
ALK PHOS: 82 U/L (ref 38–126)
ALT: 16 U/L — ABNORMAL LOW (ref 17–63)
ANION GAP: 8 (ref 5–15)
AST: 62 U/L — ABNORMAL HIGH (ref 15–41)
BUN: 49 mg/dL — ABNORMAL HIGH (ref 6–20)
CHLORIDE: 107 mmol/L (ref 101–111)
CO2: 32 mmol/L (ref 22–32)
Calcium: 7.6 mg/dL — ABNORMAL LOW (ref 8.9–10.3)
Creatinine, Ser: 2.09 mg/dL — ABNORMAL HIGH (ref 0.61–1.24)
GFR calc Af Amer: 34 mL/min — ABNORMAL LOW (ref >60)
GFR calc non Af Amer: 29 mL/min — ABNORMAL LOW (ref >60)
GLUCOSE: 109 mg/dL — AB (ref 65–99)
POTASSIUM: 3.5 mmol/L (ref 3.5–5.1)
SODIUM: 147 mmol/L — AB (ref 135–145)
Total Bilirubin: 2.4 mg/dL — ABNORMAL HIGH (ref 0.3–1.2)
Total Protein: 5.4 g/dL — ABNORMAL LOW (ref 6.5–8.1)

## 2016-02-06 LAB — MAGNESIUM: Magnesium: 1.8 mg/dL (ref 1.7–2.4)

## 2016-02-06 LAB — PROTIME-INR
INR: 1.73
Prothrombin Time: 20.5 seconds — ABNORMAL HIGH (ref 11.4–15.2)

## 2016-02-06 LAB — BASIC METABOLIC PANEL
ANION GAP: 7 (ref 5–15)
BUN: 46 mg/dL — AB (ref 6–20)
CHLORIDE: 106 mmol/L (ref 101–111)
CO2: 34 mmol/L — ABNORMAL HIGH (ref 22–32)
Calcium: 7.6 mg/dL — ABNORMAL LOW (ref 8.9–10.3)
Creatinine, Ser: 2.03 mg/dL — ABNORMAL HIGH (ref 0.61–1.24)
GFR, EST AFRICAN AMERICAN: 35 mL/min — AB (ref >60)
GFR, EST NON AFRICAN AMERICAN: 30 mL/min — AB (ref >60)
Glucose, Bld: 126 mg/dL — ABNORMAL HIGH (ref 65–99)
POTASSIUM: 3.5 mmol/L (ref 3.5–5.1)
SODIUM: 147 mmol/L — AB (ref 135–145)

## 2016-02-06 LAB — HEMOGLOBIN AND HEMATOCRIT, BLOOD
HEMATOCRIT: 17.4 % — AB (ref 39.0–52.0)
HEMOGLOBIN: 5.9 g/dL — AB (ref 13.0–17.0)

## 2016-02-06 LAB — OCCULT BLOOD X 1 CARD TO LAB, STOOL: Fecal Occult Bld: POSITIVE — AB

## 2016-02-06 LAB — CULTURE, BODY FLUID-BOTTLE

## 2016-02-06 SURGERY — EGD (ESOPHAGOGASTRODUODENOSCOPY)
Anesthesia: Moderate Sedation

## 2016-02-06 MED ORDER — MIDAZOLAM HCL 10 MG/2ML IJ SOLN
INTRAMUSCULAR | Status: DC | PRN
  Administered 2016-02-06: 2 mg via INTRAVENOUS
  Administered 2016-02-06 (×2): 1 mg via INTRAVENOUS

## 2016-02-06 MED ORDER — FUROSEMIDE 10 MG/ML IJ SOLN: 20.0000 mg | Freq: Once | INTRAMUSCULAR | Status: DC

## 2016-02-06 MED ORDER — EPINEPHRINE PF 1 MG/10ML IJ SOSY
PREFILLED_SYRINGE | INTRAMUSCULAR | Status: AC
  Filled 2016-02-06: qty 10

## 2016-02-06 MED ORDER — SODIUM CHLORIDE 0.9 % IV SOLN: INTRAVENOUS | Status: DC

## 2016-02-06 MED ORDER — BUTAMBEN-TETRACAINE-BENZOCAINE 2-2-14 % EX AERO
INHALATION_SPRAY | CUTANEOUS | Status: DC | PRN
  Administered 2016-02-06: 2 via TOPICAL

## 2016-02-06 MED ORDER — SODIUM CHLORIDE 0.9 % IJ SOLN
PREFILLED_SYRINGE | INTRAMUSCULAR | Status: DC | PRN
  Administered 2016-02-06: 4.5 mL

## 2016-02-06 MED ORDER — FENTANYL CITRATE (PF) 100 MCG/2ML IJ SOLN
INTRAMUSCULAR | Status: DC | PRN
  Administered 2016-02-06 (×2): 25 ug via INTRAVENOUS

## 2016-02-06 MED ORDER — SODIUM CHLORIDE 0.9 % IV SOLN: Freq: Once | INTRAVENOUS | Status: DC

## 2016-02-06 MED ORDER — PANTOPRAZOLE SODIUM 40 MG IV SOLR
80.0000 mg | Freq: Once | INTRAVENOUS | Status: AC
Start: 2016-02-06 — End: 2016-02-06
  Administered 2016-02-06: 80 mg via INTRAVENOUS
  Filled 2016-02-06: qty 80

## 2016-02-06 MED ORDER — MIDAZOLAM HCL 5 MG/ML IJ SOLN
INTRAMUSCULAR | Status: AC
  Filled 2016-02-06: qty 2

## 2016-02-06 MED ORDER — AMIODARONE HCL IN DEXTROSE 360-4.14 MG/200ML-% IV SOLN
30.0000 mg/h | INTRAVENOUS | Status: DC
  Administered 2016-02-06 – 2016-02-08 (×3): 30 mg/h via INTRAVENOUS
  Filled 2016-02-06 (×6): qty 200

## 2016-02-06 MED ORDER — AMIODARONE HCL IN DEXTROSE 360-4.14 MG/200ML-% IV SOLN
60.0000 mg/h | INTRAVENOUS | Status: AC
  Filled 2016-02-06: qty 200

## 2016-02-06 MED ORDER — FENTANYL CITRATE (PF) 100 MCG/2ML IJ SOLN
INTRAMUSCULAR | Status: AC
  Filled 2016-02-06: qty 2

## 2016-02-06 MED ORDER — AMIODARONE LOAD VIA INFUSION
150.0000 mg | Freq: Once | INTRAVENOUS | Status: AC
Start: 2016-02-06 — End: 2016-02-06
  Administered 2016-02-06: 150 mg via INTRAVENOUS
  Filled 2016-02-06: qty 83.34

## 2016-02-06 MED ORDER — SODIUM CHLORIDE 0.9 % IV SOLN
8.0000 mg/h | INTRAVENOUS | Status: DC
  Administered 2016-02-06 – 2016-02-08 (×7): 8 mg/h via INTRAVENOUS
  Filled 2016-02-06 (×13): qty 80

## 2016-02-06 MED ORDER — SODIUM CHLORIDE 0.9 % IV SOLN
INTRAVENOUS | Status: DC
  Administered 2016-02-06 – 2016-02-07 (×2): via INTRAVENOUS

## 2016-02-06 NOTE — Progress Notes (Signed)
Vincent HatchetJohn W Gibson 2:08 PM  Subjective: Patient seen and examined and case discussed with my partner Dr. Randa EvensEdwards and he says his bleeding is all black  Objective: Vital signs stable afebrile no acute distress lying comfortably in the bed exam please see preassessment evaluation probable recurrent ascites but nontender hemoglobin decreased INR increased  Assessment: Subacute GI bleeding  Plan: Will proceed with endoscopy just to be sure and in the past have discussed FFP and vitamin K with the patient and I would give him both of those if he will allow to try to get his INR decreased and I would make sure you are using pediatric tubes to draw blood otherwise further workup and plans pending findings at endoscopy and might consider nuclear bleeding scan next  [facility] CampusMAGOD,Akyla Vavrek E  Pager [phone] After 5PM or if no answer call [phone]

## 2016-02-06 NOTE — Progress Notes (Signed)
Date:  February 06, 2016 Chart reviewed for concurrent status and case management needs. Will continue to follow the patient for changes and needs:  bp very low/refuses bld products due to religious beliefs hgb 5.9 this am.  Is not on pressor at this point, EGD is planned. Discharge Planning: following for needs Marcelle SmilingRhonda Brenden Rudman, BSN, [place], [place]   [phone]

## 2016-02-06 NOTE — Progress Notes (Signed)
Chaplain paged to visit Mr Vincent Gibson as a EOL support. Mr Vincent Gibson indicated he was not in the mood to talk presently and that his friends from the [employer] were coming sometime today to be with him and provide spiritual comfort.  Page chaplain immediately should Mr Vincent Gibson need or request emotional or spiritual care.  Benjie Karvonenharles D. Janaisha Tolsma, DMin Chaplain

## 2016-02-06 NOTE — Op Note (Signed)
[facility] Patient Name: Vincent Gibson Procedure Date: 03/04/2016 MRN: [id number] Attending MD: Vida Rigger , MD Date of Birth: 15-Jun-1940 CSN: [id number] Age: 75 Admit Type: Inpatient Procedure:                Upper GI endoscopy Indications:              Melena Providers:                Vida Rigger, MD, Michel Bickers, RN, Arlee Muslim, Technician Referring MD:              Medicines:                Fentanyl 50 micrograms IV, Midazolam 4 mg IV,                            Cetacaine spray Complications:            No immediate complications. Estimated Blood Loss:     Estimated blood loss: none. Procedure:                Pre-Anesthesia Assessment:                           - Prior to the procedure, a History and Physical                            was performed, and patient medications and                            allergies were reviewed. The patient's tolerance of                            previous anesthesia was also reviewed. The risks                            and benefits of the procedure and the sedation                            options and risks were discussed with the patient.                            All questions were answered, and informed consent                            was obtained. Prior Anticoagulants: The patient has                            taken Coumadin (warfarin), last dose was 14 days                            prior to procedure. ASA Grade Assessment: III - A  patient with severe systemic disease. After                            reviewing the risks and benefits, the patient was                            deemed in satisfactory condition to undergo the                            procedure.                           After obtaining informed consent, the endoscope was                            passed under direct vision. Throughout the                            procedure, the  patient's blood pressure, pulse, and                            oxygen saturations were monitored continuously. The                            EG-[id number] ([id number] scope was introduced through the                            mouth, and advanced to the third part of duodenum.                            The upper GI endoscopy was somewhat difficult due                            to presence of food. The patient tolerated the                            procedure well. Scope In: Scope Out: Findings:      The larynx was normal.      A medium-sized hiatal hernia was present.      Few linear and superficial esophageal ulcers with no bleeding and no       stigmata of recent bleeding were found.      Diffuse moderate inflammation characterized by congestion (edema),       erosions and erythema was found in the cardia, in the gastric fundus, on       the greater curvature of the stomach and on the lesser curvature of the       stomach.      One non-bleeding cratered duodenal ulcer with a visible vessel was found       in the first portion of the duodenum just past the bulb at the beginning       of the C-loop. Area was successfully injected with 4.5 mL of a 1:10,000       solution of epinephrine over 3 injections for hemostasis. For       hemostasis, two hemostatic clips were successfully placed (MR  conditional). There was no bleeding at the end of the procedure.      Few clots were found in the second portion of the duodenum and in the       third portion of the duodenum.      A medium amount of food (residue) was found in the cardia, in the       gastric fundus and in the gastric antrum.      The exam was otherwise without abnormality. Impression:               - Normal larynx.                           - Medium-sized hiatal hernia.                           - Non-bleeding esophageal ulcers.                           - Chronic gastritis.                           - One non-bleeding  duodenal ulcer with a visible                            vessel. Injected. Clips (MR conditional) were                            placed.                           - Blood in the second portion of the duodenum and                            in the third portion of the duodenum.                           - A medium amount of food (residue) in the stomach.                           - The examination was otherwise normal.                           - No specimens collected. Moderate Sedation:      Moderate (conscious) sedation was administered by the endoscopy nurse       and supervised by the endoscopist. The following parameters were       monitored: oxygen saturation, heart rate, blood pressure, respiratory       rate, EKG, adequacy of pulmonary ventilation, and response to care. Recommendation:           - Clear liquid diet today and tomorrow.                           - Continue present medications. okay to use vitamin                            K and in the past he  has said okay to FFP however                            today he said no but I would rediscussed with him                            tomorrow                           - Return to GI clinic PRN.                           - Telephone GI clinic if symptomatic PRN. Procedure Code(s):        --- Professional ---                           [phone], Esophagogastroduodenoscopy, flexible,                            transoral; with control of bleeding, any method Diagnosis Code(s):        --- Professional ---                           K44.9, Diaphragmatic hernia without obstruction or                            gangrene                           K22.10, Ulcer of esophagus without bleeding                           K29.50, Unspecified chronic gastritis without                            bleeding                           K26.4, Chronic or unspecified duodenal ulcer with                            hemorrhage                            K92.2, Gastrointestinal hemorrhage, unspecified                           K92.1, Melena (includes Hematochezia) CPT copyright 2016 American Medical Association. All rights reserved. The codes documented in this report are preliminary and upon coder review may  be revised to meet current compliance requirements. Vida Rigger, MD 02-25-2016 3:06:02 PM This report has been signed electronically. Number of Addenda: 0

## 2016-02-06 NOTE — Progress Notes (Signed)
[facility] PROGRESS NOTE Subjective patient has continued to have dark stools and has dropped his hemoglobin 5.9. There was consideration for palliative care but he tells me now that he does not want it but once another Indo to see what the bleeding is coming from. He denies abdominal pain.  Objective: Vital signs in last 24 hours: Temp:  [98.5 F (36.9 C)-99.2 F (37.3 C)] 98.9 F (37.2 C) (11/03 0800) Pulse Rate:  [25-114] 66 (11/03 1000) Resp:  [15-25] 19 (11/03 1000) BP: (52-102)/(29-61) 52/29 (11/03 1000) SpO2:  [85 %-100 %] 99 % (11/03 1000) Weight:  [103.8 kg (228 lb 13.4 oz)] 103.8 kg (228 lb 13.4 oz) (11/03 0100) Last BM Date: (P) 02/05/16  Intake/Output from previous day: 11/02 0701 - 11/03 0700 In: 330.4 [P.O.:70; I.V.:110.4; IV Piggyback:150] Out: 1000 [Urine:1000] Intake/Output this shift: Total I/O In: 170 [P.O.:120; I.V.:50] Out: -   PE: General-- patient somewhat agitated, appears quite ill.  Lungs-- clear Abdomen-- distended but not tympanic and nontender  Lab Results:  Recent Labs  02/04/16 0518 02/05/16 0532 02/05/16 2339 02/20/2016 0449  WBC 12.2* 12.7* 12.2*  --   HGB 8.8* 8.0* 5.9* 5.9*  HCT 26.3* 23.5* 17.0* 17.4*  PLT 164 171 141*  --    BMET  Recent Labs  02/04/16 0518 02/05/16 0532 02/05/16 2339 02/15/2016 0449  NA 147* 146* 147* 147*  K <2.0* 2.2* 3.5 3.5  CL 108 106 106 107  CO2 34* 34* 34* 32  CREATININE 1.78* 1.79* 2.03* 2.09*   LFT  Recent Labs  02/04/16 0518 02/05/16 0532 02/27/2016 0449  PROT 5.5* 5.6* 5.4*  AST 42* 51* 62*  ALT 13* 14* 16*  ALKPHOS 68 78 82  BILITOT 2.0* 2.5* 2.4*   PT/INR  Recent Labs  02/04/16 0518 02/05/16 2339  LABPROT 19.6* 20.5*  INR 1.64 1.73   PANCREAS No results for input(s): LIPASE in the last 72 hours.       Studies/Results: No results found.  Medications: I have reviewed the patient's current medications.  Assessment/Plan: 1. G.I. bleeding. Multiple  problems noted. EGD a week ago showed esophagitis but no varices. Has been on carafate slurry and oral Protonix started back on IV Protonix. His dropped hemoglobin to 5.9. He is a [employer] and refuses blood. He will consent EGD. He had another dose of Feraheme yesterday. Will proceed later with EGD.   Mceuen JR,Quinnley Colasurdo L 02/19/2016, 11:50 AM  This note was created using voice recognition software. Minor errors may Have occurred unintentionally.  Pager: [phone] If no answer or after hours call [phone]

## 2016-02-06 NOTE — Progress Notes (Signed)
PT Cancellation Note  Patient Details Name: Vincent Gibson MRN: [id number] DOB: [birth year]   Cancelled Treatment:    Reason Eval/Treat Not Completed: Medical issues which prohibited therapy. Will check back another day.    Rebeca AlertJannie Nakeda Lebron, MPT Pager: [phone]

## 2016-02-06 NOTE — Progress Notes (Addendum)
Patient ID: Vincent Gibson, male   DOB: 08-Feb-1941, 75 y.o.   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 On arrival pt was alert and oriented, had dry mucous membranes and anasarca with leg swelling R>L with evidence of cellulitis on the right leg. Rectal temperature was 96.86F, glucose 26, he was hypotensive and tachycardic saturating 90% on room air, 99% with 2L O2. Labs were significant for creatinine 3.32, BUN 86, bicarbonate 21, CK 528, troponin 0.36, BNP 2,155. Abdomen with chest x-ray showed abnormal increased left hilar and retrocardiac density in the chest, findings suggestive of ascites and colonic ileus. INR on arrival was supratherapeutic at 6 and as high as 10 on 10/21 for which he was given oral vitamin K. CT imaging showed cirrhosis (no known history of this) and large volume ascites.   10/23 early AM, pt had about 900cc coffee ground emesis and hypotension, presumably secondary to variceal bleed in the setting of supratherapeutic INR. NG tube was placed, patient was given IV fluid boluses, octreotide and Protonix drip started and patient again given vitamin K. He remained hypotensive despite fluid challenge and hospital course complicated with third spacing in the setting of anasarca. Patient is Jehovah's Witness and has declined blood transfusion but he was agreeable to albumin and [facility]. He has required short course of levophed (weaned 01/26/2016). Patient underwent paracentesis of 02/01/2016 with 7 L fluid removed.   Assessment & Plan:  Hypovolemic/septic shock in the setting of hematemesis with acute blood loss anemia and supratherapeutic INR - Patient has required short course of  levo fed, this was weaned off 01/26/2016 - His current blood pressure 67/47 - Palliative care consulted for goals of care - Please note patient had EGD 01/14/2016 with findings of gastritis and healing duodenal ulcer but no varices - Continue Protonix drip - Please note INR is 1.73 - Hemoglobin is down to 5.9 but patient has declined blood transfusions as he is Jehovah's Witness - Patient is not hemodynamically stable for transferred out of stepdown unit   Cirrhosis/nonalcoholic fatty liver disease/recurrent ascites - Paracentesis 10/29, 7 L were removed on 10/29 - His abdomen is still distended but he cannot have paracentesis at this time due to hypotension  Severe sepsis from Morganella bacteremia/RLEcellulitis - Continues to be hemodynamically unstable with atrial fibrillation with rapid ventricular response - Patient found to have 1/2 morganella species in blood cultures that are sensitive to Rocephin.  - Rocephin was started on 10/23, will continue for a total of 2 weeks - Dr. Dulce Sellarutlaw recommended to continue antibiotics in the setting of GI bleeding/ascites-can transition to oral Cipro when ready to discharge from the hospital  Atrial fibrillation with rapid ventricular response  - CHADS vasc score 3 - Patient is not on beta blockers due to hypotension - Patient is not on any anticoagulation secondary to acute blood loss anemia and risk of bleeding - Appreciate cardiology following and recommendations  Anemia of chronic disease / acute blood loss anemia - Patient is Jehovah's Witness and declined blood transfusion  Hypernatremia - Sodium 147 likely secondary to blood loss anemia, dehydration  Acute renal failure superimposed on chronic kidney disease stage III / hepatorenal syndrome - Patient presented with  a creatinine of 3.32, baseline creatinine around 1.54 - Presenting renal failure possibly secondary to rhabdomyolysis vs acute heart failure - Creatinine 2.09 this  morning   Severe hypokalemia - Due to renal failure, sepsis - Supplemented   Acute on chronic systolic and diastolic heart failure and right heart failure and pulmonary hypertension - EF of 45-50% with mild LVH - Appreciate cardiology following  - Not on Lasix due to hypotension  - Remains edematous   Acute respiratory failure with hypoxia  - Stable respiratory status  Colonic ileus -Tolerating diet  Ascites, SBP / Abdominal distention  - Large volume of ascites noted on recent imaging studies. - Status post large volume paracentesis 02/01/2016 with 7 L fluid removed  - Fluid analysis suggestive of SBP  - Continue Rocephin   Mild rhabdomyolysis - CK improved  Severe deconditioning - PT consulted. Recommendations for SNF at time of discharge  Prolonged QTc interval:  - Presented QTC of 537. - Stable   Urinary retention - Noted on earlier CT abd - Continue foley catheter    DVT prophylaxis: SCD's bilaterally due to risk of bleeding  Code Status: DNR/DNI Family Communication: No family at the bedside this am  Disposition Plan:  Hemodynamically unstable to be transferred out of stepdown    Consultants:   Gastroenterology  Cardiology  Pulmonary critical care  Discussed case with hematology (Dr. Shirline Frees)  Palliative care   Procedures:   Foley placement by Dr. McDiarmid [facility] on 02/20/2016  Right lower extremity Doppler ultrasound 2016-02-20 negative for DVT  Right upper extremity Doppler ultrasound on 2016-02-20 negative for SVT or DVT  Patient developed hematemesis on 01/25/2016, initiated Levothroid until 01/26/2016  EGD by gastroenterology on 01/31/2016  Ultrasound-guided paracentesis by IR on 02/01/2016 yielding 7 L of ascitic fluid  Antimicrobials:   Vancomycin 10/20 --> 10/23  Rocephin 10/23 -->  Zosyn 10/20 --> 10/23    Subjective: Dark red stool overnight.   Objective: Vitals:   02/29/2016 0500 02/21/2016 0600 02/23/2016  0700 03/02/2016 0800  BP: 102/61 (!) 79/46 (!) 67/47   Pulse: 71     Resp: 17 (!) 25 (!) 22   Temp:    98.9 F (37.2 C)  TempSrc:    Oral  SpO2: 97% 98%    Weight:      Height:        Intake/Output Summary (Last 24 hours) at 02/25/2016 1020 Last data filed at 02/17/2016 0600  Gross per 24 hour  Intake           280.42 ml  Output             1000 ml  Net          -719.58 ml   Filed Weights   02/04/16 0315 02/05/16 0500 02/11/2016 0100  Weight: 105 kg (231 lb 7.7 oz) 105.8 kg (233 lb 4 oz) 103.8 kg (228 lb 13.4 oz)    Examination:  General exam: Appears calm and comfortable  Respiratory system: Clear to auscultation. Respiratory effort normal. Cardiovascular system: S1 & S2 heard, irregular rhythm Gastrointestinal system: Distended abdomen, appreciate bowel sounds, non tender  Central nervous system: No focal neurological deficits. Extremities: Unnaboots on, no tenderness  Skin: No rashes, lesions or ulcers Psychiatry: Normal mood and behavior   Data Reviewed: I have personally reviewed following labs and imaging studies  CBC:  Recent Labs Lab 02/02/16 0425 02/03/16 0443 02/04/16 0518 02/05/16 0532 02/05/16 2339 02/10/2016 0449  WBC 11.8* 12.2* 12.2* 12.7* 12.2*  --  HGB 9.6* 9.0* 8.8* 8.0* 5.9* 5.9*  HCT 28.3* 27.2* 26.3* 23.5* 17.0* 17.4*  MCV 95.6 95.1 95.3 94.4 92.9  --   PLT 165 160 164 171 141*  --    Basic Metabolic Panel:  Recent Labs Lab 02/03/16 0443 02/04/16 0518 02/05/16 0532 02/05/16 0735 02/05/16 2339 02/22/2016 0449  NA 151* 147* 146*  --  147* 147*  K 2.0* <2.0* 2.2*  --  3.5 3.5  CL 110 108 106  --  106 107  CO2 33* 34* 34*  --  34* 32  GLUCOSE 128* 125* 91  --  126* 109*  BUN 48* 40* 43*  --  46* 49*  CREATININE 1.63* 1.78* 1.79*  --  2.03* 2.09*  CALCIUM 8.1* 7.5* 7.4*  --  7.6* 7.6*  MG 1.5* 1.9  --  1.8 1.8  --    GFR: Estimated Creatinine Clearance: 37.5 mL/min (by C-G formula based on SCr of 2.09 mg/dL (H)). Liver Function  Tests:  Recent Labs Lab 02/02/16 0425 02/03/16 0443 02/04/16 0518 02/05/16 0532 02/05/2016 0449  AST 29 35 42* 51* 62*  ALT 13* 14* 13* 14* 16*  ALKPHOS 62 66 68 78 82  BILITOT 2.4* 2.4* 2.0* 2.5* 2.4*  PROT 5.7* 5.9* 5.5* 5.6* 5.4*  ALBUMIN 2.3* 2.4* 2.0* 1.9* 1.9*   No results for input(s): LIPASE, AMYLASE in the last 168 hours. No results for input(s): AMMONIA in the last 168 hours. Coagulation Profile:  Recent Labs Lab 02/01/16 0455 02/02/16 0425 02/03/16 0443 02/04/16 0518 02/05/16 2339  INR 1.63 1.58 1.54 1.64 1.73   Cardiac Enzymes:  Recent Labs Lab 01/30/16 2201 01/31/16 0401 01/31/16 1034  TROPONINI 0.14* 0.13* 0.12*   BNP (last 3 results) No results for input(s): PROBNP in the last 8760 hours. HbA1C: No results for input(s): HGBA1C in the last 72 hours. CBG:  Recent Labs Lab 02/01/16 1938 02/03/16 0806 02/03/16 1155 02/03/16 1630 02/04/16 1524  GLUCAP 94 135* 114* 123* 104*   Lipid Profile: No results for input(s): CHOL, HDL, LDLCALC, TRIG, CHOLHDL, LDLDIRECT in the last 72 hours. Thyroid Function Tests: No results for input(s): TSH, T4TOTAL, FREET4, T3FREE, THYROIDAB in the last 72 hours. Anemia Panel: No results for input(s): VITAMINB12, FOLATE, FERRITIN, TIBC, IRON, RETICCTPCT in the last 72 hours. Urine analysis:    Component Value Date/Time   COLORURINE AMBER (A) 11/19/15 1507   APPEARANCEUR CLOUDY (A) 11/19/15 1507   LABSPEC 1.018 11/19/15 1507   PHURINE 5.5 11/19/15 1507   GLUCOSEU NEGATIVE 11/19/15 1507   HGBUR SMALL (A) 11/19/15 1507   BILIRUBINUR SMALL (A) 11/19/15 1507   KETONESUR NEGATIVE 11/19/15 1507   PROTEINUR NEGATIVE 11/19/15 1507   NITRITE NEGATIVE 11/19/15 1507   LEUKOCYTESUR NEGATIVE 11/19/15 1507   Sepsis Labs: @LABRCNTIP (procalcitonin:4,lacticidven:4)   Recent Results (from the past 240 hour(s))  Gram stain     Status: None   Collection Time: 02/01/16 10:03 AM  Result Value Ref Range  Status   Specimen Description PARACENTESIS  Final   Special Requests NONE  Final   Gram Stain   Final    MODERATE WBC PRESENT, PREDOMINANTLY PMN NO ORGANISMS SEEN Performed [facility]    Report Status 02/01/2016 FINAL  Final  Culture, body fluid-bottle     Status: None (Preliminary result)   Collection Time: 02/01/16 10:03 AM  Result Value Ref Range Status   Specimen Description PARACENTESIS  Final   Special Requests NONE  Final   Culture   Final  NO GROWTH 4 DAYS Performed at [facility]    Report Status PENDING  Incomplete  C difficile quick scan w PCR reflex     Status: None   Collection Time: 02/05/16 11:40 AM  Result Value Ref Range Status   C Diff antigen NEGATIVE NEGATIVE Final   C Diff toxin NEGATIVE NEGATIVE Final   C Diff interpretation No C. difficile detected.  Final      Radiology Studies: No results found.   Scheduled Meds: . atorvastatin  40 mg Oral q1800  . cefTRIAXone (ROCEPHIN)  IV  2 g Intravenous Q24H  . lidocaine  1 application Urethral Once  . sodium chloride flush  10 mL Intravenous Q8H  . sucralfate  1 g Oral TID WC & HS   Continuous Infusions: . pantoprozole (PROTONIX) infusion 8 mg/hr (02/25/2016 0600)     LOS: 14 days    Time spent: 25 minutes  Greater than 50% of the time spent on counseling and coordinating the care.   Manson Passey, MD [facility] Pager [phone]  If 7PM-7AM, please contact night-coverage [email or web address] Password [email or web address] 02/21/2016, 10:20 AM

## 2016-02-06 NOTE — Progress Notes (Signed)
CRITICAL VALUE ALERT  Critical value received:  Hemoglobin 5.9  Date of notification:  03/03/2016  Time of notification:  0048  Critical value read back:Yes.    Nurse who received alert:  Rutha BouchardShelby Bari Leib, RN  MD notified (1st page):  Donnamarie PoagK. Kirby, NP  Time of first page:  [phone]  MD notified (2nd page):  Time of second page:  Responding MD:  Donnamarie PoagK. Kirby, NP  Time MD responded:  0100

## 2016-02-06 NOTE — Consult Note (Signed)
Consultation Note Date: 03/02/2016   Patient Name: Vincent Gibson  DOB: 12/13/1940  MRN: [id number]  Age / Sex: 75 y.o., male  PCP: Georgianne Fick, MD Referring Physician: Alison Murray, MD  Reason for Consultation: Establishing goals of care  HPI/Patient Profile: 75 y.o. 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 Patient was initially admitted for atrial fibrillation, GI bleed, rhabdomyolysis. Patient's hospital course has been complicated, he has been treated for hypovolemic/septic shock in the setting of hematemesis, acute blood loss anemia, supra therapeutic INR. He has been diagnosed with cirrhosis/nonalcoholic fatty liver disease with recurrent ascites has undergone paracenteses in this hospitalization, diagnosed with severe sepsis from Morganella bacteremia/right lower extremity cellulitis. Hospital course also complicated by acute renal failure superimposed on stage III chronic kidney disease possible hepatorenal syndrome. Patient has been followed by cardiology, gastroenterology, pulmonary and critical care medicine in this hospitalization. 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  Patient remains in stepdown unit, critically ill, blood pressure 72/46 currently, ongoing GI bleed. He is to undergo repeat esophageal gastroduodenoscopy this afternoon on 11-3. Earlier EGD  showed esophagitis, nonbleeding duodenal ulcers. Current hemoglobin is 5.9 g/dL. Cardiology is managing atrial fibrillation. He is to be placed shortly on amiodarone in a continuous infusion manner. He is on IV fluids. He appears with anasarca. Palliative care consultation has been requested for goals of care discussions.  Patient is an elderly gentleman resting in bed. He appears with generalized volume overload/anasarca. He opens his eyes he is able to engage and interact meaningfully. Briefly introduced scope of palliative services as follows: Palliative medicine is specialized medical care for people living with serious illness. It focuses on providing relief from the symptoms and stress of a serious illness. The goal is to improve quality of life for both the patient and the family.  Patient is hopeful that repeat EGD to be done later today will show cause of bleeding. There is no next of kin. He has a friend by the name of Vincent Gibson. DO NOT RESUSCITATE/DO NOT INTUBATE reconfirmed with the patient. See recommendations below. Palliative will continue to follow along. Thank you for the consult.      SUMMARY OF RECOMMENDATIONS    DNR DNI Patient awaiting EGD this afternoon Continue current scope of treatments for now.  No clearly established HCPOA agent, patient is decisional for now, states friend Vincent Gibson is next of kin, call placed and message left for Mr Buzzy Han at [phone].   Palliative care will continue to follow along.   Code Status/Advance Care Planning:  DNR  Symptom Management:    continue current treatment.   Palliative Prophylaxis:   Delirium Protocol     Psycho-social/Spiritual:   Desire for further Chaplaincy support:yes  Additional Recommendations: Education on Hospice  Prognosis:   Guarded.   Discharge Planning: To Be Determined      Primary Diagnoses: Present on Admission: . AKI (acute kidney injury) (HCC) . Urinary retention .  Prolonged Q-T interval on ECG . Atrial fibrillation (HCC) . Chronic combined systolic and diastolic CHF (congestive heart failure) (HCC) . Sepsis (HCC) . Cellulitis and abscess of right lower extremity . Pressure injury of skin   I have reviewed the medical record, interviewed the patient and family, and examined the patient. The following aspects are pertinent.  Past Medical History:  Diagnosis Date  . A-fib (HCC)   . CHF (congestive heart failure) (HCC)   . Enlarged heart   . Hyperlipemia   . Hypertension   . Refusal of blood transfusions as patient is Jehovah's Witness    Social History   Social History  . Marital status: Single    Spouse name: N/A  . Number of children: N/A  . Years of education: N/A   Social History Main Topics  . Smoking status: Never Smoker  . Smokeless tobacco: Never Used  . Alcohol use No  . Drug use: No  . Sexual activity: Not Asked   Other Topics Concern  . None   Social History Narrative  . None   Family History  Problem Relation Age of Onset  . Heart failure Mother    Scheduled Meds: . amiodarone  150 mg Intravenous Once  . atorvastatin  40 mg Oral q1800  . cefTRIAXone (ROCEPHIN)  IV  2 g Intravenous Q24H  . lidocaine  1 application Urethral Once  . sodium chloride flush  10 mL Intravenous Q8H  . sucralfate  1 g Oral TID WC & HS   Continuous Infusions: . sodium chloride 75 mL/hr at December 22, 2015 1356  . amiodarone     Followed by  . amiodarone    . pantoprozole (PROTONIX) infusion 8 mg/hr (December 22, 2015 1211)   PRN Meds:.acetaminophen **OR** acetaminophen, butamben-tetracaine-benzocaine, EPINEPHrine 1:10,000, 10 mL syringe/NS, 10 mL vial for sclerotherapy inj mixture, fentaNYL, Glycerin (Adult), metoCLOPramide (REGLAN) injection, midazolam, nitroGLYCERIN, senna-docusate Medications Prior to Admission:  Prior to Admission medications   Medication Sig Start Date End Date Taking? Authorizing Provider  atorvastatin (LIPITOR) 40 MG tablet  Take 40 mg by mouth daily as needed (high cholesterol).   Yes Historical Provider, MD  furosemide (LASIX) 40 MG tablet Take 40 mg by mouth daily as needed for fluid or edema.   Yes Historical Provider, MD  irbesartan (AVAPRO) 300 MG tablet Take 300 mg by mouth daily.  10/14/15  Yes Historical Provider, MD  metoprolol succinate (TOPROL-XL) 50 MG 24 hr tablet Take 50 mg by mouth daily.  09/18/15  Yes Historical Provider, MD  warfarin (COUMADIN) 5 MG tablet Take 2.5 mg by mouth daily.  10/17/15  Yes Historical Provider, MD   No Known Allergies Review of Systems Complains of generalized weakness  Physical Exam Awake appears weak S1 S2 Clear breath sounds anteriorly Abdomen distended Edema  Vital Signs: BP (!) 75/43 (BP Location: Left Arm)   Pulse (!) 131   Temp 98.7 F (37.1 C) (Oral)   Resp (!) 22   Ht 5\' 11"  (1.803 m)   Wt 103.8 kg (228 lb 13.4 oz)   SpO2 100%   BMI 31.92 kg/m  Pain Assessment: Faces  POSS *See Group Information*: S-Acceptable,Sleep, easy to arouse Pain Score: 0-No pain   SpO2: SpO2: 100 % O2 Device:SpO2: 100 % O2 Flow Rate: .O2 Flow Rate (L/min): 5 L/min  IO: Intake/output summary:  Intake/Output Summary (Last 24 hours) at 02/09/2016 1452 Last data filed at 03/03/2016 1300  Gross per 24 hour  Intake           575.42 ml  Output             1000 ml  Net          -424.58 ml    LBM: Last BM Date: 02/05/16 Baseline Weight: Weight: 125.6 kg (276 lb 14.4 oz) Most recent weight: Weight: 103.8 kg (228 lb 13.4 oz)     Palliative Assessment/Data:   Flowsheet Rows   Flowsheet Row Most Recent Value  Intake Tab  Referral Department  Hospitalist  Unit at Time of Referral  ICU  Palliative Care Primary Diagnosis  Other (Comment) [GIB A fib ]  Palliative Care Type  New Palliative care  Reason for referral  Clarify Goals of Care  Date first seen by Palliative Care  02/18/2016  Clinical Assessment  Palliative Performance Scale Score  30%  Pain Max last 24 hours  4    Pain Min Last 24 hours  3  Dyspnea Max Last 24 Hours  3  Dyspnea Min Last 24 hours  2  Nausea Max Last 24 Hours  3  Nausea Min Last 24 Hours  2  Psychosocial & Spiritual Assessment  Palliative Care Outcomes  Patient/Family meeting held?  Yes  Who was at the meeting?  patient who is decisional.   Palliative Care Outcomes  Clarified goals of care      Time In:  1330 Time Out:  1430 Time Total:  60 min  Greater than 50%  of this time was spent counseling and coordinating care related to the above assessment and plan.  Signed by: Rosalin HawkingZeba Skyelar Swigart, MD  [phone]  Please contact Palliative Medicine Team phone at [phone] for questions and concerns.  For individual provider: See Loretha StaplerAmion

## 2016-02-06 NOTE — Progress Notes (Addendum)
Brief HPI: Pt was admitted on 01/29/2016 after being found down x 3 days. Upon admit, he had rhabdo, anasarca, AF with RVR, cellulitis with severe sepsis, AKI, ileus, coagulopathy, and acute respiratory failure. Other chronic problems included: right heart failure, pulmonary HTN, RBBB, HTN, and HLD.  During hospital stay, in addition to above, he has had acute blood loss anemia from hematemesis s/p EGD on 08-17-15 with noted ulcers. Hypovolemic shock occurred as result of bleeding and pt required pressors for awhile. Also, acute CHF with volume overload and ascites requiring paracentesis on 02/01/16. Cirrhosis was found on CT. Coagulopathy since admission which has required Vit K and severe hypokalemia as well.  Pt is a Jehovah's Witness and refuses transfusions and FFP, but has had albumin during stay.  Shift event: pt has had dark red diarrhea on this shift about 3 times. CBC checked stat which revealed a Hgb 5.9. This is a 2gm drop since yesterday. Hgb on chart as high as 9s during this hospitalization. INR rising at 1.73 (1.64). Plts tending down at 141.  NP to bedside to speak with pt.   NP explained that his "iron count" is low and he has been actively bleeding tonight. Explained that sometimes this type of bleeding can stop on it's own, but if not, considering he refuses blood products, his Hgb will continue to drop which can eventually lead to "lack of oxygen" to organs, organ failure, hypovolemic shock, and even death. Pt stated he is "not afraid to die" and confirms he is a DNR.   NP broached the subject of moving towards comfort care. NP discussed withdrawal of some or all care and stopping procedures. He says he is willing to have "procedures" but NP explained that he may not be a candidate given his elevated INR and low Hgb. Also, his other issues and hypotension may prevent any further procedures. He will think on it.   Plan at this time:  1. Dark red stools-likely same as before and upper  GIB. No active bleeding was found on previous EGD and no varices. Hopefully, bleeding will stop. Has been about 2 hours since last bloody stool. Restart PPI drip. Will call GI if he bleeds more tonight. Recheck Hgb at 0500, although he refuses blood products, so this may be futile.   2. Hypotension-BP is soft, but review of chart shows BPs in the 80s - 90s range. Tachycardic at times. Can use Albumin if needed as pt has agreed to this in the past. Will verify with pt if needed.  3. Cirrhosis with severe ascites-consideration has been given to r/p paracentesis if pt is a candidate. GI following.  4. Acute on chronic CHF-holding IVFs.  5. AKI-creatinine continues to worsen.  6. Runs of VT tonight-K and Mg are normal.  7. DNR. NP suggest palliative care consult tomorrow. He has so much going on and not sure he will recover.   Above discussed with Dr. Ophelia CharterYates of [facility] who agrees with the plan. KJKG, NP [facility] 620am update: No further bloody stools. Hgb remains 5.9. BP soft and more tachycardic. BP too soft to give rate control med. Remains NPO except meds in case procedure is needed this am.  O2 sat normal on RA.  KJKG, NP [facility]

## 2016-02-06 NOTE — Progress Notes (Signed)
Chaplain referred by night coverage.  Pt actively dying  Night coverage reports that pt does not express spiritual care needs at this time.  Will continue to follow for support as needed.     Burnis KingfisherStalnaker, Dimas Scheck [place] MDiv  [phone]

## 2016-02-06 NOTE — Progress Notes (Signed)
Patient Name: Vincent Gibson Date of Encounter: 02/17/2016  Primary Cardiologist: Dr. Rennis Golden [facility] Problem List     Principal Problem:   Sepsis [facility]) Active Problems:   AKI (acute kidney injury) (HCC)   Urinary retention   Prolonged Q-T interval on ECG   Atrial fibrillation (HCC)   Chronic combined systolic and diastolic CHF (congestive heart failure) (HCC)   Cellulitis and abscess of right lower extremity   Pressure injury of skin   Rhabdomyolysis   GI bleeding   Coagulopathy (HCC)   Lactic acidosis   Elevated troponin   Shock (HCC)   Hypoalbuminemia   RBBB   Bacteremia   Acute respiratory failure (HCC)   Abdominal distention   Right heart failure   Ascites   Dysphagia, oropharyngeal phase   CHF (congestive heart failure) (HCC)     Subjective   No chest pain. Lethargic, wants some ice chips  Inpatient Medications    Scheduled Meds: . atorvastatin  40 mg Oral q1800  . cefTRIAXone (ROCEPHIN)  IV  2 g Intravenous Q24H  . lidocaine  1 application Urethral Once  . sodium chloride flush  10 mL Intravenous Q8H  . sucralfate  1 g Oral TID WC & HS   Continuous Infusions: . pantoprozole (PROTONIX) infusion 8 mg/hr (02/04/2016 0600)   PRN Meds: acetaminophen **OR** acetaminophen, Glycerin (Adult), metoCLOPramide (REGLAN) injection, nitroGLYCERIN, senna-docusate   Vital Signs    Vitals:   02/14/2016 0500 03/02/2016 0600 02/20/2016 0700 02/15/2016 0800  BP: 102/61 (!) 79/46 (!) 67/47   Pulse: 71     Resp: 17 (!) 25 (!) 22   Temp:    98.9 F (37.2 C)  TempSrc:    Oral  SpO2: 97% 98%    Weight:      Height:        Intake/Output Summary (Last 24 hours) at 03/01/2016 0955 Last data filed at 02/13/2016 0600  Gross per 24 hour  Intake           280.42 ml  Output             1000 ml  Net          -719.58 ml   Filed Weights   02/04/16 0315 02/05/16 0500 02/21/2016 0100  Weight: 231 lb 7.7 oz (105 kg) 233 lb 4 oz (105.8 kg) 228 lb 13.4 oz (103.8 kg)     Physical Exam   GEN:  Chronically ill appearing, lethargic HEENT: Grossly normal.  Neck: Supple, no JVD, carotid bruits, or masses. Cardiac: irreg irreg, tachy, no murmurs, rubs, or gallops. No clubbing, cyanosis, edema.  Radials/DP/PT 2+ and equal bilaterally.  Respiratory:  Respirations regular and unlabored, clear to auscultation bilaterally. GI: Soft, nontender, ++distended, BS + x 4. MS: no deformity or atrophy. Skin: warm and dry, no rash. Neuro:  Strength and sensation are intact. Psych: AAOx3.  Normal affect.  Labs    CBC  Recent Labs  02/05/16 0532 02/05/16 2339 02/23/2016 0449  WBC 12.7* 12.2*  --   HGB 8.0* 5.9* 5.9*  HCT 23.5* 17.0* 17.4*  MCV 94.4 92.9  --   PLT 171 141*  --    Basic Metabolic Panel  Recent Labs  02/05/16 0735 02/05/16 2339 02/13/2016 0449  NA  --  147* 147*  K  --  3.5 3.5  CL  --  106 107  CO2  --  34* 32  GLUCOSE  --  126* 109*  BUN  --  46*  49*  CREATININE  --  2.03* 2.09*  CALCIUM  --  7.6* 7.6*  MG 1.8 1.8  --    Liver Function Tests  Recent Labs  02/05/16 0532 08-15-15 0449  AST 51* 62*  ALT 14* 16*  ALKPHOS 78 82  BILITOT 2.5* 2.4*  PROT 5.6* 5.4*  ALBUMIN 1.9* 1.9*    Telemetry    afib with RVR. HRs elevated overnight and  - Personally Reviewed  ECG    afib with RVR with RBBB, HR low 100s currently - Personally Reviewed  Radiology    No results found.  Cardiac Studies   2D ECHO: 01/27/2016 LV EF: 55% -   60% Study Conclusions - Left ventricle: The cavity size was normal. There was severe   asymmetric hypertrophy of the septum. Systolic function was   normal. The estimated ejection fraction was in the range of 55%   to 60%. Wall motion was normal; there were no regional wall   motion abnormalities. Doppler parameters are consistent with   restrictive physiology, indicative of decreased left ventricular   diastolic compliance and/or increased left atrial pressure. - Ventricular septum: The  contour showed diastolic flattening and   systolic flattening. - Aortic valve: There was trivial regurgitation. - Mitral valve: There was mild regurgitation. - Left atrium: The atrium was mildly dilated. - Right ventricle: The cavity size was severely dilated. Systolic   function was moderately reduced. - Right atrium: The atrium was severely dilated. - Atrial septum: The septum bowed from right to left, consistent   with increased right atrial pressure. - Pulmonary arteries: Systolic pressure was severely increased. PA   peak pressure: 75 mm Hg (S). - Line: A venous catheter was visualized with its tip in the right   atrium. - Pericardium, extracardiac: A small pericardial effusion was   identified. Impressions: - Normal LV systolic function; severe asymmetric LVH; restrictive   filling; trace TR; mild LAE; mild MR; severe RAE and RVE;   moderate RV dysfunction; mild TR with severely elevated pulmonary   pressure; small pericardial effusion.   Patient Profile     This is a 75 y.o.male has apast medical history significant for A-fib, chronic combined S/D CHF (EF 45-50% (12/2015)), hypertension, dyslipidemia and mild right heart failure with moderate pulmonary hypertension who presented after a fall in his bathtub. Found to have acute renal failure and mild rhabomyolysis. Went into septic shock and had acute GIB as well. Now off warfarin. Troponin mildly elevated at 0.25, 0.36.   Assessment & Plan    1 Persistent Atrial fibrillation-rate is elevated today but BP too low to start AVN blocking agents. Cannot use amiodarone 2/2 liver dz or dig 2/2 CKD. Anticoagulation on hold given recent GI bleed. L  2 Acute combined  S/D CHF with right sided CHF and pulm HTN- patient was markedly volume overloaded (but improving) and there is probable component of third spacing. He was been on Lasix IV 60mg  BID. Net neg 20L. Diuretics held given worsening creat to 2.09.  3 hypernatremia-would  liberalize free H2O.  4 shock-resolved. But now more hypotensive likely 2/2 to blood loss and refusing blood products due to religious beliefs.  5 GI bleed-management per internal medicine and gastroenterology. Pt is a Jehovah's Witness and refuses transfusions and FFP, but has had albumin during stay. Anemia worsening Hg down to 5. Patient DNR and okay with palliative care approach.   6 Hypokalemia- resolved with supplementatin  7 Dispo - patient leaning towards palliative approach.  Signed, Cline CrockKathryn Thompson, PA-C  02/11/2016, 9:55 AM  As above, patient seen and examined. He denies chest pain or dyspnea. Patient is now hypotensive and hemoglobin has decreased to 5.9. 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 EGD is planned to reassess for potential bleeding. Continue Protonix. He is likely in hypovolemic shock. I will give IV fluids. Can resume levophed if needed. His heart rate is elevated likely being driven by GI bleed. I will add IV amiodarone as that is her only option to help control rate. Patient is critically ill and prognosis appears to be poor. Palliative care would be appropriate.  Olga MillersBrian Crenshaw, MD

## 2016-02-07 DIAGNOSIS — R140 Abdominal distension (gaseous): Secondary | ICD-10-CM

## 2016-02-07 DIAGNOSIS — K264 Chronic or unspecified duodenal ulcer with hemorrhage: Secondary | ICD-10-CM

## 2016-02-07 LAB — CBC
HEMATOCRIT: 15.3 % — AB (ref 39.0–52.0)
Hemoglobin: 5.2 g/dL — CL (ref 13.0–17.0)
MCH: 31.5 pg (ref 26.0–34.0)
MCHC: 34 g/dL (ref 30.0–36.0)
MCV: 92.7 fL (ref 78.0–100.0)
PLATELETS: 137 10*3/uL — AB (ref 150–400)
RBC: 1.65 MIL/uL — ABNORMAL LOW (ref 4.22–5.81)
RDW: 17.4 % — AB (ref 11.5–15.5)
WBC: 12 10*3/uL — ABNORMAL HIGH (ref 4.0–10.5)

## 2016-02-07 LAB — COMPREHENSIVE METABOLIC PANEL
ALBUMIN: 1.8 g/dL — AB (ref 3.5–5.0)
ALT: 18 U/L (ref 17–63)
AST: 64 U/L — AB (ref 15–41)
Alkaline Phosphatase: 82 U/L (ref 38–126)
Anion gap: 12 (ref 5–15)
BILIRUBIN TOTAL: 2.3 mg/dL — AB (ref 0.3–1.2)
BUN: 57 mg/dL — AB (ref 6–20)
CHLORIDE: 105 mmol/L (ref 101–111)
CO2: 29 mmol/L (ref 22–32)
CREATININE: 2.82 mg/dL — AB (ref 0.61–1.24)
Calcium: 7.8 mg/dL — ABNORMAL LOW (ref 8.9–10.3)
GFR calc Af Amer: 24 mL/min — ABNORMAL LOW (ref >60)
GFR, EST NON AFRICAN AMERICAN: 20 mL/min — AB (ref >60)
GLUCOSE: 128 mg/dL — AB (ref 65–99)
POTASSIUM: 3.8 mmol/L (ref 3.5–5.1)
Sodium: 146 mmol/L — ABNORMAL HIGH (ref 135–145)
TOTAL PROTEIN: 5.5 g/dL — AB (ref 6.5–8.1)

## 2016-02-07 NOTE — Progress Notes (Signed)
[facility] Progress [facility]  Vincent Gibson Wessels 75 y.o. 1940/09/08   Subjective: Lying in bed. Friend at bedside.  Objective: Vital signs in last 24 hours: Vitals:   02/07/16 0900 02/07/16 1000  BP: (!) 70/40 (!) 65/37  Pulse: (!) 29   Resp: (!) 21 10  Temp: 97.5     Physical Exam: Gen: lethargic, elderly, frail, no acute distress CV: RRR Chest: CTA B Abd: diffuse tenderness, +distention, +BS  Lab Results:  Recent Labs  02/05/16 0735 02/05/16 2339 2016-02-12 0449 02/07/16 0508  NA  --  147* 147* 146*  K  --  3.5 3.5 3.8  CL  --  106 107 105  CO2  --  34* 32 29  GLUCOSE  --  126* 109* 128*  BUN  --  46* 49* 57*  CREATININE  --  2.03* 2.09* 2.82*  CALCIUM  --  7.6* 7.6* 7.8*  MG 1.8 1.8  --   --     Recent Labs  2016-02-12 0449 02/07/16 0508  AST 62* 64*  ALT 16* 18  ALKPHOS 82 82  BILITOT 2.4* 2.3*  PROT 5.4* 5.5*  ALBUMIN 1.9* 1.8*    Recent Labs  02/05/16 2339 2016-02-12 0449 02/07/16 0930  WBC 12.2*  --  12.0*  HGB 5.9* 5.9* 5.2*  HCT 17.0* 17.4* 15.3*  MCV 92.9  --  92.7  PLT 141*  --  137*    Recent Labs  02/05/16 2339  LABPROT 20.5*  INR 1.73      Assessment/Plan: Severe anemia and s/p EGD showing a duodenal ulcer with a non-bleeding visible vessel s/p hemoclipping. Patient refusing all blood products including FFP. Continue Protonix infusion. Clear liquid diet. Supportive care.   Jataya Wann C. 02/07/2016, 11:33 AM  Pager [phone]  If no answer or after 5 PM call [phone] ID: Vincent Gibson Petrucelli, male   DOB: 1940/09/08, 89 y.o.   MRN: [id number]

## 2016-02-07 NOTE — Progress Notes (Signed)
    Subjective:  Denies CP or dyspnea   Objective:  Vitals:   02/07/16 0025 02/07/16 0400 02/07/16 0500 02/07/16 0800  BP:      Pulse:      Resp:      Temp: 98 F (36.7 C) 97.2 F (36.2 C)  97.5 F (36.4 C)  TempSrc: Oral Axillary  Oral  SpO2:      Weight:   244 lb 4.3 oz (110.8 kg)   Height:        Intake/Output from previous day:  Intake/Output Summary (Last 24 hours) at 02/07/16 0844 Last data filed at 03/01/2016 1600  Gross per 24 hour  Intake              405 ml  Output                0 ml  Net              405 ml    Physical Exam: Physical exam: Well-developed chronically ill appearing in no acute distress. Anasarca with some improvement Skin is warm and dry.  HEENT is normal.  Neck is supple.  Chest is clear to auscultation with normal expansion.  Cardiovascular exam is irregular Abdominal exam nontender; distended Extremities show chronic skin changes and edema. neuro grossly intact    Lab Results: Basic Metabolic Panel:  Recent Labs  16/01/9610/02/17 0735 02/05/16 2339 02/19/2016 0449 02/07/16 0508  NA  --  147* 147* 146*  K  --  3.5 3.5 3.8  CL  --  106 107 105  CO2  --  34* 32 29  GLUCOSE  --  126* 109* 128*  BUN  --  46* 49* 57*  CREATININE  --  2.03* 2.09* 2.82*  CALCIUM  --  7.6* 7.6* 7.8*  MG 1.8 1.8  --   --    CBC:  Recent Labs  02/05/16 0532 02/05/16 2339 02/09/2016 0449  WBC 12.7* 12.2*  --   HGB 8.0* 5.9* 5.9*  HCT 23.5* 17.0* 17.4*  MCV 94.4 92.9  --   PLT 171 141*  --      Assessment/Plan:  This is a 75 y.o.male has apast medical history significant for A-fib, chronic combined S/D CHF (EF 45-50% (12/2015)), hypertension, dyslipidemia and mild right heart failure with moderate pulmonary hypertension who presented after a fall in his bathtub. Found to have acute renal failure and mild rhabomyolysis. Went into septic shock and had acute GIB as well. Now off warfarin. Troponin mildly elevated at 0.25, 0.36.   1 persistent atrial  fibrillation-heart rate is improved today on IV amiodarone. Will continue. No anticoagulation given recurrent GI bleed.  2 acute combined systolic/diastolic congestive heart failure, right heart failure, pulmonary hypertension-he remains somewhat volume overloaded but renal function worse. This is in the setting of hypovolemic shock from GI bleed. There is also a component of decreased oncotic pressure from hypoalbuminemia. Diuretics on hold.  3 GI bleed-patient is status post injection/clipping of ulcer. Continue protonix. He remains hypotensive. He is getting normal saline. 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 Prognosis poor. Would add low-dose levophed if we would like to continue to be aggressive.  4 acute kidney disease-renal function had been improving but now worse. He is being gently hydrated in the setting of GI bleed and inability to transfuse.  5 cirrhosis  Vincent MillersBrian Crenshaw 02/07/2016, 8:44 AM

## 2016-02-07 NOTE — Progress Notes (Signed)
Patient ID: Vincent Gibson, male   DOB: 01-10-1941, 75 y.o.   MRN: [id number]  PROGRESS NOTE    Vincent Gibson  NWG:[id number] DOB: 04-29-1940 DOA: 01/08/2016  PCP: Georgianne Fick, MD   Brief Narrative:  75 y.o.malewith past medical history of AFib on coumadin, chronic systolic and diastolic CHF with EF (45-50%) and right heart failure, pulmonary HTN who was found down for 3 days in a bathtub. On arrival pt was alert and oriented, had dry mucous membranes and anasarca with leg swelling R>L with evidence of cellulitis on the right leg. Rectal temperature was 96.53F, glucose 26, he was hypotensive and tachycardic saturating 90% on room air, 99% with 2L O2. Labs were significant for creatinine 3.32, BUN 86, bicarbonate 21, CK 528, troponin 0.36, BNP 2,155. Abdomen with chest x-ray showed abnormal increased left hilar and retrocardiac density in the chest, findings suggestive of ascites and colonic ileus. INR on arrival was supratherapeutic at 6 and as high as 10 on 10/21 for which he was given oral vitamin K. CT imaging showed cirrhosis (no known history of this) and large volume ascites.   10/23 early AM, pt had about 900cc coffee ground emesis and hypotension, presumably secondary to variceal bleed in the setting of supratherapeutic INR. NG tube was placed, patient was given IV fluid boluses, octreotide and Protonix drip started and patient again given vitamin K. He remained hypotensive despite fluid challenge and hospital course complicated with third spacing in the setting of anasarca. Patient is Jehovah's Witness and has declined blood transfusion but he was agreeable to albumin and [facility]. He has required short course of levophed (weaned 01/26/2016). Patient underwent paracentesis of 02/01/2016 with 7 L fluid removed.   Assessment & Plan:  Hypovolemic/septic shock in the setting of hematemesis with acute blood loss anemia and supratherapeutic INR - Patient has required short course of  levo fed, this was weaned off 01/26/2016 - His current blood pressure 69/31 - Palliative care consulted for goals of care, appreciate their assessment  - Please note patient had EGD 01/23/2016 with findings of gastritis and healing duodenal ulcer but no varices - EGD repeated 03/02/2016 - medium size hiatal hernia, non bleeding esophageal ulcers, chronic gastritis, non bleeding duodenal ulcer with a visible vessel, injected and clips were placed; blood in second and third portion of the duodenum  - Continue Protonix drip - Hemoglobin is down to 5.9 but patient has declined blood transfusions as he is Jehovah's Witness - CBC pending this am - Patient is not hemodynamically stable for transferred out of stepdown unit   Cirrhosis/nonalcoholic fatty liver disease/recurrent ascites - Paracentesis 10/29, 7 L were removed on 10/29 - Abd distended but he cannot have paracentesis due to hypotension  Severe sepsis from Morganella bacteremia/RLEcellulitis - Continues to be hemodynamically unstable with atrial fibrillation with rapid ventricular response - Patient found to have 1/2 morganella species in blood cultures that are sensitive to Rocephin.  - Rocephin was started on 10/23 which he will continue for a total of 2 weeks - Dr. Dulce Sellar recommended to continue antibiotics in the setting of GI bleeding/ascites-can transition to oral Cipro when ready to discharge from the hospital  Atrial fibrillation with rapid ventricular rate - CHADS vasc score 3 - Patient is not on beta blockers due to hypotension - Patient is not on any anticoagulation secondary to acute blood loss anemia and risk of bleeding - Rhythm controlled with IV amiodarone  - Appreciate cardiology following and recommendations  Mild troponin elevation - Demand  ischemia from CKD - No reports of chest pain -Cardiology following   Anemia of chronic disease / acute blood loss anemia - Patient is Jehovah's Witness and declined blood  transfusion  Hypernatremia - Sodium 146, likely secondary to blood loss anemia, dehydration  Acute renal failure superimposed on chronic kidney disease stage III / hepatorenal syndrome - Patient presented with a creatinine of 3.32, baseline creatinine around 1.54 - Presenting renal failure possibly secondary to rhabdomyolysis vs acute heart failure - Creatinine continues to trend up, 2.82 this am    Severe hypokalemia - Due to renal failure, sepsis - Supplemented and WNL  Acute on chronic systolic and diastolic heart failure and right heart failure and pulmonary hypertension - EF of 45-50% with mild LVH - Appreciate cardiology following  - Not on Lasix due to hypotension  - Remains edematous   Acute respiratory failure with hypoxia  - Stable respiratory status  Colonic ileus -Tolerating diet  Ascites, SBP / Abdominal distention  - Large volume of ascites noted on recent imaging studies. - Status post large volume paracentesis 02/01/2016 with 7 L fluid removed  - Fluid analysis suggestive of SBP  - Continue Rocephin   Mild rhabdomyolysis - CK improved  Severe deconditioning - PT consulted. Recommendations for SNF at time of discharge  Prolonged QTc interval:  - Presented QTC of 537. - Stable   Urinary retention - Noted on earlier CT abd - Continue foley catheter    DVT prophylaxis: SCD's bilaterally due to risk of bleeding  Code Status: DNR/DNI Family Communication: No family at the bedside this am  Disposition Plan:  Hemodynamically unstable to be transferred out of stepdown    Consultants:   Gastroenterology  Cardiology  Pulmonary critical care  Discussed case with hematology (Dr. Shirline FreesMohammed)  Palliative care   Procedures:   Foley placement by Dr. McDiarmid [facility] on 01/06/2016  Right lower extremity Doppler ultrasound 01/12/2016 negative for DVT  Right upper extremity Doppler ultrasound on 01/08/2016 negative for SVT or  DVT  Patient developed hematemesis on 01/25/2016, initiated Levothroid until 01/26/2016  EGD 01/21/2016  Ultrasound-guided paracentesis by IR on 02/01/2016 yielding 7 L of ascitic fluid  EGD 02/05/2016   Antimicrobials:   Vancomycin 10/20 --> 10/23  Rocephin 10/23 -->  Zosyn 10/20-10/23   Subjective: Dark red stool overnight.   Objective: Vitals:   02/07/16 0025 02/07/16 0400 02/07/16 0500 02/07/16 0800  BP:      Pulse:      Resp:      Temp: 98 F (36.7 C) 97.2 F (36.2 C)  97.5 F (36.4 C)  TempSrc: Oral Axillary  Oral  SpO2:      Weight:   110.8 kg (244 lb 4.3 oz)   Height:        Intake/Output Summary (Last 24 hours) at 02/07/16 0857 Last data filed at 10/29/2015 1600  Gross per 24 hour  Intake              405 ml  Output                0 ml  Net              405 ml   Filed Weights   02/05/16 0500 10/29/2015 0100 02/07/16 0500  Weight: 105.8 kg (233 lb 4 oz) 103.8 kg (228 lb 13.4 oz) 110.8 kg (244 lb 4.3 oz)    Examination:  General exam: Appears calm and comfortable, no distress  Respiratory system: No wheezing, no rhonchi  Cardiovascular system: S1 & S2 heard, irregular rhythm Gastrointestinal system: Distended abdomen, no tenderness  Central nervous system: Nonfocal  Extremities: Unnaboots on, palpable pulses  Skin: warm, dry  Psychiatry: calm, not agitated or restless   Data Reviewed: I have personally reviewed following labs and imaging studies  CBC:  Recent Labs Lab 02/02/16 0425 02/03/16 0443 02/04/16 0518 02/05/16 0532 02/05/16 2339 05/24/15 0449  WBC 11.8* 12.2* 12.2* 12.7* 12.2*  --   HGB 9.6* 9.0* 8.8* 8.0* 5.9* 5.9*  HCT 28.3* 27.2* 26.3* 23.5* 17.0* 17.4*  MCV 95.6 95.1 95.3 94.4 92.9  --   PLT 165 160 164 171 141*  --    Basic Metabolic Panel:  Recent Labs Lab 02/03/16 0443 02/04/16 0518 02/05/16 0532 02/05/16 0735 02/05/16 2339 05/24/15 0449 02/07/16 0508  NA 151* 147* 146*  --  147* 147* 146*  K 2.0* <2.0* 2.2*   --  3.5 3.5 3.8  CL 110 108 106  --  106 107 105  CO2 33* 34* 34*  --  34* 32 29  GLUCOSE 128* 125* 91  --  126* 109* 128*  BUN 48* 40* 43*  --  46* 49* 57*  CREATININE 1.63* 1.78* 1.79*  --  2.03* 2.09* 2.82*  CALCIUM 8.1* 7.5* 7.4*  --  7.6* 7.6* 7.8*  MG 1.5* 1.9  --  1.8 1.8  --   --    GFR: Estimated Creatinine Clearance: 28.7 mL/min (by C-G formula based on SCr of 2.82 mg/dL (H)). Liver Function Tests:  Recent Labs Lab 02/03/16 0443 02/04/16 0518 02/05/16 0532 05/24/15 0449 02/07/16 0508  AST 35 42* 51* 62* 64*  ALT 14* 13* 14* 16* 18  ALKPHOS 66 68 78 82 82  BILITOT 2.4* 2.0* 2.5* 2.4* 2.3*  PROT 5.9* 5.5* 5.6* 5.4* 5.5*  ALBUMIN 2.4* 2.0* 1.9* 1.9* 1.8*   No results for input(s): LIPASE, AMYLASE in the last 168 hours. No results for input(s): AMMONIA in the last 168 hours. Coagulation Profile:  Recent Labs Lab 02/01/16 0455 02/02/16 0425 02/03/16 0443 02/04/16 0518 02/05/16 2339  INR 1.63 1.58 1.54 1.64 1.73   Cardiac Enzymes:  Recent Labs Lab 01/31/16 1034  TROPONINI 0.12*   BNP (last 3 results) No results for input(s): PROBNP in the last 8760 hours. HbA1C: No results for input(s): HGBA1C in the last 72 hours. CBG:  Recent Labs Lab 02/01/16 1938 02/03/16 0806 02/03/16 1155 02/03/16 1630 02/04/16 1524  GLUCAP 94 135* 114* 123* 104*   Lipid Profile: No results for input(s): CHOL, HDL, LDLCALC, TRIG, CHOLHDL, LDLDIRECT in the last 72 hours. Thyroid Function Tests: No results for input(s): TSH, T4TOTAL, FREET4, T3FREE, THYROIDAB in the last 72 hours. Anemia Panel: No results for input(s): VITAMINB12, FOLATE, FERRITIN, TIBC, IRON, RETICCTPCT in the last 72 hours. Urine analysis:    Component Value Date/Time   COLORURINE AMBER (A) 01/07/2016 1507   APPEARANCEUR CLOUDY (A) 01/25/2016 1507   LABSPEC 1.018 01/30/2016 1507   PHURINE 5.5 01/26/2016 1507   GLUCOSEU NEGATIVE 01/22/2016 1507   HGBUR SMALL (A) 01/07/2016 1507   BILIRUBINUR SMALL  (A) 01/24/2016 1507   KETONESUR NEGATIVE 01/30/2016 1507   PROTEINUR NEGATIVE 01/13/2016 1507   NITRITE NEGATIVE 01/17/2016 1507   LEUKOCYTESUR NEGATIVE 02/03/2016 1507   Sepsis Labs: @LABRCNTIP (procalcitonin:4,lacticidven:4)   Recent Results (from the past 240 hour(s))  Gram stain     Status: None   Collection Time: 02/01/16 10:03 AM  Result Value Ref Range Status   Specimen Description PARACENTESIS  Final  Special Requests NONE  Final   Gram Stain   Final    MODERATE WBC PRESENT, PREDOMINANTLY PMN NO ORGANISMS SEEN Performed at [facility]    Report Status 02/01/2016 FINAL  Final  Culture, body fluid-bottle     Status: None   Collection Time: 02/01/16 10:03 AM  Result Value Ref Range Status   Specimen Description PARACENTESIS  Final   Special Requests NONE  Final   Culture   Final    NO GROWTH 5 DAYS Performed at [facility]    Report Status 02/05/2016 FINAL  Final  C difficile quick scan w PCR reflex     Status: None   Collection Time: 02/05/16 11:40 AM  Result Value Ref Range Status   C Diff antigen NEGATIVE NEGATIVE Final   C Diff toxin NEGATIVE NEGATIVE Final   C Diff interpretation No C. difficile detected.  Final      Radiology Studies: No results found.   Scheduled Meds: . atorvastatin  40 mg Oral q1800  . cefTRIAXone (ROCEPHIN)  IV  2 g Intravenous Q24H  . lidocaine  1 application Urethral Once  . sodium chloride flush  10 mL Intravenous Q8H  . sucralfate  1 g Oral TID WC & HS   Continuous Infusions: . sodium chloride    . sodium chloride 75 mL/hr at 02/15/2016 1600  . amiodarone 30 mg/hr (02/24/2016 2302)  . pantoprozole (PROTONIX) infusion 8 mg/hr (02/25/2016 2302)     LOS: 15 days    Time spent: 25 minutes  Greater than 50% of the time spent on counseling and coordinating the care.   Manson Passey, MD [facility] Pager [phone]  If 7PM-7AM, please contact night-coverage [email or web address] Password [email or web address] 02/07/2016,  8:57 AM

## 2016-02-07 NOTE — Progress Notes (Signed)
Daily Progress Note   Patient Name: Vincent Gibson       Date: 02/07/2016 DOB: Aug 04, 1940  Age: 75 y.o. MRN#: [id number] Attending Physician: Alison Murray, MD Primary Care Physician: Georgianne Fick, MD Admit Date: 01/06/2016  Reason for Consultation/Follow-up: Establishing goals of care  Subjective:  patient is awake resting in bed, he opens eyes and answers questions appropriately No family at bedside, see below:   Length of Stay: 15  Current Medications: Scheduled Meds:  . atorvastatin  40 mg Oral q1800  . cefTRIAXone (ROCEPHIN)  IV  2 g Intravenous Q24H  . lidocaine  1 application Urethral Once  . sodium chloride flush  10 mL Intravenous Q8H  . sucralfate  1 g Oral TID WC & HS    Continuous Infusions: . sodium chloride    . sodium chloride 75 mL/hr at 02/07/16 1200  . amiodarone 30 mg/hr (02/07/16 1200)  . pantoprozole (PROTONIX) infusion 8 mg/hr (02/07/16 1200)    PRN Meds: acetaminophen **OR** acetaminophen, Glycerin (Adult), metoCLOPramide (REGLAN) injection, nitroGLYCERIN, senna-docusate  Physical Exam         Weak generalized weakness deconditioning evident S1 S2 irregular Diminished bases Abdomen moderate distension Anasarca, generalized edema, worse in UE Awake but not entirely alert.   Vital Signs: BP (!) 81/31   Pulse (!) 43   Temp 97.6 F (36.4 C) (Oral)   Resp 15   Ht 5\' 11"  (1.803 m)   Wt 110.8 kg (244 lb 4.3 oz)   SpO2 99%   BMI 34.07 kg/m  SpO2: SpO2: 99 % O2 Device: O2 Device: Not Delivered O2 Flow Rate: O2 Flow Rate (L/min): 5 L/min  Intake/output summary:  Intake/Output Summary (Last 24 hours) at 02/07/16 1320 Last data filed at 02/07/16 1200  Gross per 24 hour  Intake          1029.92 ml  Output               30 ml  Net            999.92 ml   LBM: Last BM Date: 02/05/16 Baseline Weight: Weight: 125.6 kg (276 lb 14.4 oz) Most recent weight: Weight: 110.8 kg (244 lb 4.3 oz)       Palliative Assessment/Data:    Flowsheet Rows   Flowsheet Row Most Recent Value  Intake Tab  Referral Department  Hospitalist  Unit at Time of Referral  ICU  Palliative Care Primary Diagnosis  Other (Comment) [GIB deconditioning A fib rhabdomyolysis ]  Palliative Care Type  Return patient Palliative Care  Reason for referral  Clarify Goals of Care  Date first seen by Palliative Care  02/13/2016  Clinical Assessment  Palliative Performance Scale Score  20%  Pain Max last 24 hours  4  Pain Min Last 24 hours  3  Dyspnea Max Last 24 Hours  4  Dyspnea Min Last 24 hours  3  Nausea Max Last 24 Hours  3  Nausea Min Last 24 Hours  2  Psychosocial & Spiritual Assessment  Palliative Care Outcomes  Patient/Family meeting held?  Yes  Who was at the meeting?  patient is awake/alert.   Palliative Care Outcomes  Clarified goals of care      Patient Active Problem List   Diagnosis Date Noted  . Encounter for palliative care   . Goals of care, counseling/discussion   . CHF (congestive heart failure) (HCC)   . Dysphagia, oropharyngeal phase   . Ascites   . Right heart failure   . Abdominal distention   . Rhabdomyolysis 01/27/2016  . GI bleeding 01/27/2016  . Coagulopathy (HCC) 01/27/2016  . Lactic acidosis 01/27/2016  . Elevated troponin 01/27/2016  . Shock (HCC) 01/27/2016  . Hypoalbuminemia 01/27/2016  . RBBB 01/27/2016  . Bacteremia 01/27/2016  . Acute respiratory failure (HCC)   . Pressure injury of skin 01/24/2016  . AKI (acute kidney injury) (HCC) 11-17-15  . Urinary retention 11-17-15  . Prolonged Q-T interval on ECG 11-17-15  . Atrial fibrillation (HCC) 11-17-15  . Chronic combined systolic and diastolic CHF (congestive heart failure) (HCC) 11-17-15  . Sepsis (HCC) 11-17-15  . Cellulitis and abscess of right  lower extremity 11-17-15    Palliative Care Assessment & Plan   Patient Profile:    Assessment:  GIB: EGD on 03/02/2016 with non bleeding duodenal ulcer with visible vessel s/p injection, clip. Also noted to have non bleeding esophageal ulcer, blood in D2 and D3.  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  Severe sepsis from morganella bacteremia/ RLE cellulitis Generalized anasarca, deconditioning Low BP/hemodynamic instability.   Recommendations/Plan: Goals of care: patient elects continuation of DNR DNI. He is how ever, willing to continue current scope of hospitalization. Will continue to monitor trajectory, patient wishes to consider SNF rehab at discharge, how ever, if remains hemodynamically unstable, or has additional bleeding, then will have initiate hospice discussions.    Code Status:    Code Status Orders        Start     Ordered   May 31, 2015 1537  Do not attempt resuscitation (DNR)  Continuous    Question Answer Comment  In the event of cardiac or respiratory ARREST Do not call a "code blue"   In the event of cardiac or respiratory ARREST Do not perform Intubation, CPR, defibrillation or ACLS   In the event of cardiac or respiratory ARREST Use medication by any route, position, wound care, and other measures to relive pain and suffering. May use oxygen, suction and manual treatment of airway obstruction as needed for comfort.      May 31, 2015 1537    Code Status History    Date Active Date Inactive Code Status Order ID Comments User Context   This patient has a current code status but no historical code status.    Advance Directive Documentation   Flowsheet  Row Most Recent Value  Type of Advance Directive  Healthcare Power of Attorney  Pre-existing out of facility DNR order (yellow form or pink MOST form)  No data  "MOST" Form in Place?  No data       Prognosis:   guarded.   Discharge Planning:  To Be Determined  Care plan was  discussed with  Patient.   Thank you for allowing the Palliative Medicine Team to assist in the care of this patient.   Time In: 11 Time Out: 1125 Total Time 25 Prolonged Time Billed  no       Greater than 50%  of this time was spent counseling and coordinating care related to the above assessment and plan.  Rosalin HawkingZeba Hance Caspers, MD [phone]  Please contact Palliative Medicine Team phone at [phone] for questions and concerns.

## 2016-02-08 LAB — COMPREHENSIVE METABOLIC PANEL
ALK PHOS: 83 U/L (ref 38–126)
ALT: 16 U/L — AB (ref 17–63)
AST: 65 U/L — AB (ref 15–41)
Albumin: 1.9 g/dL — ABNORMAL LOW (ref 3.5–5.0)
Anion gap: 11 (ref 5–15)
BUN: 67 mg/dL — AB (ref 6–20)
CALCIUM: 7.8 mg/dL — AB (ref 8.9–10.3)
CO2: 27 mmol/L (ref 22–32)
CREATININE: 3.47 mg/dL — AB (ref 0.61–1.24)
Chloride: 108 mmol/L (ref 101–111)
GFR, EST AFRICAN AMERICAN: 18 mL/min — AB (ref >60)
GFR, EST NON AFRICAN AMERICAN: 16 mL/min — AB (ref >60)
Glucose, Bld: 102 mg/dL — ABNORMAL HIGH (ref 65–99)
Potassium: 3.6 mmol/L (ref 3.5–5.1)
Sodium: 146 mmol/L — ABNORMAL HIGH (ref 135–145)
Total Bilirubin: 2.1 mg/dL — ABNORMAL HIGH (ref 0.3–1.2)
Total Protein: 5.5 g/dL — ABNORMAL LOW (ref 6.5–8.1)

## 2016-02-08 LAB — CBC
HCT: 14.2 % — ABNORMAL LOW (ref 39.0–52.0)
Hemoglobin: 5 g/dL — CL (ref 13.0–17.0)
MCH: 32.3 pg (ref 26.0–34.0)
MCHC: 35.2 g/dL (ref 30.0–36.0)
MCV: 91.6 fL (ref 78.0–100.0)
PLATELETS: 135 10*3/uL — AB (ref 150–400)
RBC: 1.55 MIL/uL — AB (ref 4.22–5.81)
RDW: 17.8 % — AB (ref 11.5–15.5)
WBC: 13 10*3/uL — AB (ref 4.0–10.5)

## 2016-02-08 MED ORDER — SODIUM CHLORIDE 0.9 % IV BOLUS (SEPSIS)
1000.0000 mL | Freq: Once | INTRAVENOUS | Status: AC
Start: 2016-02-08 — End: 2016-02-08
  Administered 2016-02-08: 1000 mL via INTRAVENOUS

## 2016-02-08 MED ORDER — FERUMOXYTOL INJECTION 510 MG/17 ML
510.0000 mg | Freq: Once | INTRAVENOUS | Status: AC
  Administered 2016-02-08: 510 mg via INTRAVENOUS
  Filled 2016-02-08: qty 17

## 2016-02-08 MED ORDER — HYDROMORPHONE HCL 1 MG/ML IJ SOLN: 1.0000 mg | Freq: Once | INTRAMUSCULAR | Status: DC

## 2016-02-09 ENCOUNTER — Encounter (HOSPITAL_COMMUNITY): Payer: Self-pay | Admitting: Gastroenterology

## 2016-03-05 NOTE — Progress Notes (Signed)
Vincent Gibson is growing wary of this long stay in the hospital and wary of his decline toward death. He is appreciative of visits and care given but has become lethargic.   Page chaplain if Vincent Gibson requires or requests further spiritual care.  [name], DMin Chaplain

## 2016-03-05 NOTE — Progress Notes (Signed)
Patient expired at 2051. Two Rns verified. Dorann LodgeHui Peng and [place] M.  Family  And MD notifed.

## 2016-03-05 NOTE — Progress Notes (Addendum)
Patient ID: Vincent Gibson, male   DOB: 10/09/1940, 75 y.o.   MRN: [id number]  PROGRESS NOTE    AWAB ABEBE  MVH:[id number] DOB: Sep 09, 1940 DOA: Feb 09, 2016  PCP: Georgianne Fick, MD   Brief Narrative:  75 y.o.malewith past medical history of AFib on coumadin, chronic systolic and diastolic CHF with EF (45-50%) and right heart failure, pulmonary HTN who was found down for 3 days in a bathtub. On arrival pt was alert and oriented, had dry mucous membranes and anasarca with leg swelling R>L with evidence of cellulitis on the right leg. Rectal temperature was 96.70F, glucose 26, he was hypotensive and tachycardic saturating 90% on room air, 99% with 2L O2. Labs were significant for creatinine 3.32, BUN 86, bicarbonate 21, CK 528, troponin 0.36, BNP 2,155. Abdomen with chest x-ray showed abnormal increased left hilar and retrocardiac density in the chest, findings suggestive of ascites and colonic ileus. INR on arrival was supratherapeutic at 6 and as high as 10 on 10/21 for which he was given oral vitamin K. CT imaging showed cirrhosis (no known history of this) and large volume ascites.   10/23 early AM, pt had about 900cc coffee ground emesis and hypotension, presumably secondary to variceal bleed in the setting of supratherapeutic INR. NG tube was placed, patient was given IV fluid boluses, octreotide and Protonix drip started and patient again given vitamin K. He remained hypotensive despite fluid challenge and hospital course complicated with third spacing in the setting of anasarca. Patient is Jehovah's Witness and has declined blood transfusion but he was agreeable to albumin and [employer]. He has required short course of levophed (weaned 01/26/2016). Patient underwent paracentesis of 02/01/2016 with 7 L fluid removed.  Pt continues to decline clinically. He remains hypotensive and I spoke with the pt about having pressor support but he declined. Appreciate palliative care assistance  continuing to address goals of care.   Assessment & Plan:  Hypovolemic/septic shock in the setting of hematemesis with acute blood loss anemia and supratherapeutic INR - Patient has required short course of levo fed, this was weaned off 01/26/2016 - Patient continues to be in hypotensive shock, blood pressure 72/46 this morning. I spoke with him about initiating pressor support even though he is DO NOT RESUSCITATE but he declined having pressors - Appreciate palliative care following an continuing to address goals of care - Please note patient had EGD 01/14/2016 with findings of gastritis and healing duodenal ulcer but no varices - EGD repeated 02/24/2016 - medium size hiatal hernia, non bleeding esophageal ulcers, chronic gastritis, non bleeding duodenal ulcer with a visible vessel, injected and clips were placed; blood in second and third portion of the duodenum  - Continue Protonix drip - Hemoglobin is 5 this morning, as noted above he is Jehovah's Witness and has declined blood products - Will give Feraheme today - Continue to monitor in step down unit  Cirrhosis/nonalcoholic fatty liver disease/recurrent ascites - Paracentesis 10/29, 7 L were removed on 10/29 - Abd distended but he cannot have paracentesis due to hypotension  Severe sepsis from Morganella bacteremia/RLEcellulitis - Continues to be hemodynamically unstable with atrial fibrillation with rapid ventricular response - Patient found to have 1/2 morganella species in blood cultures that are sensitive to Rocephin.  - Rocephin was started on 10/23 which he will continue for a total of 2 weeks - Dr. Dulce Sellar recommended to continue antibiotics in the setting of GI bleeding/ascites-can transition to oral Cipro when ready to discharge from the hospital  Atrial fibrillation  with rapid ventricular rate - CHADS vasc score 3 - Patient is not on beta blockers due to hypotension - Patient is not on any anticoagulation secondary to  acute blood loss anemia and risk of bleeding - Rhythm controlled with IV amiodarone  - Appreciate cardiology following  Mild troponin elevation - Demand ischemia from CKD - No reports of chest pain - Cardiology following   Anemia of chronic disease / acute blood loss anemia - Patient is Jehovah's Witness and declined blood transfusion  Hypernatremia - Sodium 146, likely secondary to blood loss anemia, dehydration  Acute renal failure superimposed on chronic kidney disease stage III / hepatorenal syndrome - Patient presented with a creatinine of 3.32, baseline creatinine around 1.54 - Presenting renal failure possibly secondary to rhabdomyolysis vs acute heart failure - Creatinine continues to trend up, 3.47 this morning, up from yesterday's value of 2.82   Severe hypokalemia - Due to renal failure, sepsis - Supplemented and WNL  Acute on chronic systolic and diastolic heart failure and right heart failure and pulmonary hypertension - EF of 45-50% with mild LVH - Appreciate cardiology following  - Not on Lasix due to hypotension  - Remains edematous   Acute respiratory failure with hypoxia  - Stable respiratory status  Colonic ileus -Tolerating diet  Ascites, SBP / Abdominal distention  - Large volume of ascites noted on recent imaging studies. - Status post large volume paracentesis 02/01/2016 with 7 L fluid removed  - Fluid analysis suggestive of SBP  - Continue Rocephin   Mild rhabdomyolysis - CK improved  Severe deconditioning - PT consulted. Recommendations for SNF at time of discharge  Prolonged QTc interval:  - Presented QTC of 537. - Stable   Urinary retention - Noted on earlier CT abd - Continue foley catheter    DVT prophylaxis: SCD's bilaterally due to risk of bleeding  Code Status: DNR/DNI Family Communication: No family at the bedside this am  Disposition Plan:  Hemodynamically unstable to be transferred out of stepdown     Consultants:   Gastroenterology  Cardiology  Pulmonary critical care  Discussed case with hematology (Dr. Shirline FreesMohammed)  Palliative care   Procedures:   Foley placement by Dr. McDiarmid U[facility] on 01/21/2016  Right lower extremity Doppler ultrasound 01/30/2016 negative for DVT  Right upper extremity Doppler ultrasound on 01/09/2016 negative for SVT or DVT  Patient developed hematemesis on 01/25/2016, initiated Levothroid until 01/26/2016  EGD 2015-06-26  Ultrasound-guided paracentesis by IR on 02/01/2016 yielding 7 L of ascitic fluid  EGD 02/13/2016   Antimicrobials:   Vancomycin 10/20 --> 10/23  Rocephin 10/23 -->  Zosyn 10/20-10/23   Subjective: No overnight events.  Objective: Vitals:   02/19/2016 0429 02/22/2016 0500 02/26/2016 0600 02/28/2016 0700  BP:  (!) 68/38 (!) 68/37 (!) 72/46  Pulse:  90 (!) 102 87  Resp:  (!) 25 (!) 21 (!) 27  Temp:      TempSrc:      SpO2:  100% 100% 100%  Weight: 111.7 kg (246 lb 4.1 oz)     Height:        Intake/Output Summary (Last 24 hours) at 02/26/2016 0910 Last data filed at 02/28/2016 0600  Gross per 24 hour  Intake           2600.7 ml  Output               80 ml  Net           2520.7 ml   [employer] Weights  03/01/2016 0100 02/07/16 0500 Dec 04, 2015 0429  Weight: 103.8 kg (228 lb 13.4 oz) 110.8 kg (244 lb 4.3 oz) 111.7 kg (246 lb 4.1 oz)    Examination:  General exam: No acute distress, calm and comfortable Respiratory system: Bilateral air entry, no wheezing Cardiovascular system: S1 & S2 heard, irregular rhythm Gastrointestinal system: Appreciate bowel sounds, softly distended abdomen Central nervous system: No focal deficits Extremities: Unn[place]oots on, palpable pulses  Skin: No lesions or ulcers Psychiatry: calm, not agitated or restless   Data Reviewed: I have personally reviewed following labs and imaging studies  CBC:  Recent Labs Lab 02/04/16 0518 02/05/16 0532 02/05/16 2339 03/03/2016 0449 02/07/16 0930  Dec 04, 2015 0430  WBC 12.2* 12.7* 12.2*  --  12.0* 13.0*  HGB 8.8* 8.0* 5.9* 5.9* 5.2* 5.0*  HCT 26.3* 23.5* 17.0* 17.4* 15.3* 14.2*  MCV 95.3 94.4 92.9  --  92.7 91.6  PLT 164 171 141*  --  137* 135*   Basic Metabolic Panel:  Recent Labs Lab 02/03/16 0443 02/04/16 0518 02/05/16 0532 02/05/16 0735 02/05/16 2339 02/22/2016 0449 02/07/16 0508 Dec 04, 2015 0430  NA 151* 147* 146*  --  147* 147* 146* 146*  K 2.0* <2.0* 2.2*  --  3.5 3.5 3.8 3.6  CL 110 108 106  --  106 107 105 108  CO2 33* 34* 34*  --  34* 32 29 27  GLUCOSE 128* 125* 91  --  126* 109* 128* 102*  BUN 48* 40* 43*  --  46* 49* 57* 67*  CREATININE 1.63* 1.78* 1.79*  --  2.03* 2.09* 2.82* 3.47*  CALCIUM 8.1* 7.5* 7.4*  --  7.6* 7.6* 7.8* 7.8*  MG 1.5* 1.9  --  1.8 1.8  --   --   --    GFR: Estimated Creatinine Clearance: 23.4 mL/min (by C-G formula based on SCr of 3.47 mg/dL (H)). Liver Function Tests:  Recent Labs Lab 02/04/16 0518 02/05/16 0532 02/28/2016 0449 02/07/16 0508 Dec 04, 2015 0430  AST 42* 51* 62* 64* 65*  ALT 13* 14* 16* 18 16*  ALKPHOS 68 78 82 82 83  BILITOT 2.0* 2.5* 2.4* 2.3* 2.1*  PROT 5.5* 5.6* 5.4* 5.5* 5.5*  ALBUMIN 2.0* 1.9* 1.9* 1.8* 1.9*   No results for input(s): LIPASE, AMYLASE in the last 168 hours. No results for input(s): AMMONIA in the last 168 hours. Coagulation Profile:  Recent Labs Lab 02/02/16 0425 02/03/16 0443 02/04/16 0518 02/05/16 2339  INR 1.58 1.54 1.64 1.73   Cardiac Enzymes: No results for input(s): CKTOTAL, CKMB, CKMBINDEX, TROPONINI in the last 168 hours. BNP (last 3 results) No results for input(s): PROBNP in the last 8760 hours. HbA1C: No results for input(s): HGBA1C in the last 72 hours. CBG:  Recent Labs Lab 02/01/16 1938 02/03/16 0806 02/03/16 1155 02/03/16 1630 02/04/16 1524  GLUCAP 94 135* 114* 123* 104*   Lipid Profile: No results for input(s): CHOL, HDL, LDLCALC, TRIG, CHOLHDL, LDLDIRECT in the last 72 hours. Thyroid Function Tests: No  results for input(s): TSH, T4TOTAL, FREET4, T3FREE, THYROIDAB in the last 72 hours. Anemia Panel: No results for input(s): VITAMINB12, FOLATE, FERRITIN, TIBC, IRON, RETICCTPCT in the last 72 hours. Urine analysis:    Component Value Date/Time   COLORURINE AMBER (A) 01/06/2016 1507   APPEARANCEUR CLOUDY (A) 01/20/2016 1507   LABSPEC 1.018 01/25/2016 1507   PHURINE 5.5 01/05/2016 1507   GLUCOSEU NEGATIVE 01/29/2016 1507   HGBUR SMALL (A) 01/10/2016 1507   BILIRUBINUR SMALL (A) 01/07/2016 1507   KETONESUR NEGATIVE 01/15/2016 1507  PROTEINUR NEGATIVE 01/30/2016 1507   NITRITE NEGATIVE 01/22/2016 1507   LEUKOCYTESUR NEGATIVE 01/25/2016 1507   Sepsis Labs: @LABRCNTIP (procalcitonin:4,lacticidven:4)   Recent Results (from the past 240 hour(s))  Gram stain     Status: None   Collection Time: 02/01/16 10:03 AM  Result Value Ref Range Status   Specimen Description PARACENTESIS  Final   Special Requests NONE  Final   Gram Stain   Final    MODERATE WBC PRESENT, PREDOMINANTLY PMN NO ORGANISMS SEEN Performed at [facility]    Report Status 02/01/2016 FINAL  Final  Culture, body fluid-bottle     Status: None   Collection Time: 02/01/16 10:03 AM  Result Value Ref Range Status   Specimen Description PARACENTESIS  Final   Special Requests NONE  Final   Culture   Final    NO GROWTH 5 DAYS Performed at [facility]    Report Status 03/03/2016 FINAL  Final  C difficile quick scan w PCR reflex     Status: None   Collection Time: 02/05/16 11:40 AM  Result Value Ref Range Status   C Diff antigen NEGATIVE NEGATIVE Final   C Diff toxin NEGATIVE NEGATIVE Final   C Diff interpretation No C. difficile detected.  Final      Radiology Studies: No results found.   Scheduled Meds: . atorvastatin  40 mg Oral q1800  . cefTRIAXone (ROCEPHIN)  IV  2 g Intravenous Q24H  .  HYDROmorphone (DILAUDID) injection  1 mg Intravenous Once  . lidocaine  1 application Urethral Once  .  sodium chloride flush  10 mL Intravenous Q8H  . sucralfate  1 g Oral TID WC & HS   Continuous Infusions: . sodium chloride    . amiodarone 30 mg/hr (02/07/16 1900)  . pantoprozole (PROTONIX) infusion 8 mg/hr (March 03, 2016 0741)     LOS: 16 days    Time spent: 25 minutes  Greater than 50% of the time spent on counseling and coordinating the care.   Manson Passey, MD [facility] Pager [phone]  If 7PM-7AM, please contact night-coverage [email or web address] Password [email or web address] 03-03-2016, 9:10 AM

## 2016-03-05 NOTE — Progress Notes (Signed)
Daily Progress Note   Patient Name: Vincent HatchetJohn W Gibson       Date: 02/07/2016 DOB: [birth year]  Age: 75 y.o. MRN#: [id number] Attending Physician: Alison MurrayAlma M Devine, MD Primary Care Physician: Georgianne FickAMACHANDRAN,AJITH, MD Admit Date: 01/16/2016  Reason for Consultation/Follow-up: Establishing goals of care  Subjective:  patient is awake resting in bed, he is confused No family at bedside, see below:   Length of Stay: 16  Current Medications: Scheduled Meds:  . atorvastatin  40 mg Oral q1800  . cefTRIAXone (ROCEPHIN)  IV  2 g Intravenous Q24H  . ferumoxytol  510 mg Intravenous Once  .  HYDROmorphone (DILAUDID) injection  1 mg Intravenous Once  . lidocaine  1 application Urethral Once  . sodium chloride flush  10 mL Intravenous Q8H  . sucralfate  1 g Oral TID WC & HS    Continuous Infusions: . sodium chloride    . amiodarone 30 mg/hr (02/07/16 1900)  . pantoprozole (PROTONIX) infusion 8 mg/hr (10-04-15 0741)    PRN Meds: acetaminophen **OR** acetaminophen, Glycerin (Adult), metoCLOPramide (REGLAN) injection, nitroGLYCERIN, senna-docusate  Physical Exam         Weak generalized weakness deconditioning evident S1 S2 irregular Diminished bases Abdomen moderate distension Anasarca, generalized edema, worse in UE Confused   Vital Signs: BP (!) 72/46   Pulse 87   Temp 97.7 F (36.5 C) (Oral)   Resp (!) 27   Ht 5\' 11"  (1.803 m)   Wt 111.7 kg (246 lb 4.1 oz)   SpO2 100%   BMI 34.35 kg/m  SpO2: SpO2: 100 % O2 Device: O2 Device: Nasal Cannula O2 Flow Rate: O2 Flow Rate (L/min): 3 L/min  Intake/output summary:   Intake/Output Summary (Last 24 hours) at 10-04-15 0948 Last data filed at 10-04-15 0600  Gross per 24 hour  Intake           2600.7 ml  Output               80 ml  Net            2520.7 ml   LBM: Last BM Date: 02/07/16 Baseline Weight: Weight: 125.6 kg (276 lb 14.4 oz) Most recent weight: Weight: 111.7 kg (246 lb 4.1 oz)       Palliative Assessment/Data:    Financial controllerlowsheet Rows   Flowsheet Row  Most Recent Value  Intake Tab  Referral Department  Hospitalist  Unit at Time of Referral  ICU  Palliative Care Primary Diagnosis  Other (Comment) [GIB deconditioning A fib rhabdomyolysis ]  Palliative Care Type  Return patient Palliative Care  Reason for referral  Clarify Goals of Care  Date first seen by Palliative Care  02/10/2016  Clinical Assessment  Palliative Performance Scale Score  20%  Pain Max last 24 hours  4  Pain Min Last 24 hours  3  Dyspnea Max Last 24 Hours  4  Dyspnea Min Last 24 hours  3  Nausea Max Last 24 Hours  3  Nausea Min Last 24 Hours  2  Psychosocial & Spiritual Assessment  Palliative Care Outcomes  Patient/Family meeting held?  Yes  Who was at the meeting?  patient is awake/alert.   Palliative Care Outcomes  Clarified goals of care      Patient Active Problem List   Diagnosis Date Noted  . Encounter for palliative care   . Goals of care, counseling/discussion   . CHF (congestive heart failure) (HCC)   . Dysphagia, oropharyngeal phase   . Ascites   . Right heart failure   . Abdominal distention   . Rhabdomyolysis 01/27/2016  . GI bleeding 01/27/2016  . Coagulopathy (HCC) 01/27/2016  . Lactic acidosis 01/27/2016  . Elevated troponin 01/27/2016  . Shock (HCC) 01/27/2016  . Hypoalbuminemia 01/27/2016  . RBBB 01/27/2016  . Bacteremia 01/27/2016  . Acute respiratory failure (HCC)   . Pressure injury of skin 01/24/2016  . AKI (acute kidney injury) (HCC) 02/01/16  . Urinary retention 02-01-2016  . Prolonged Q-T interval on ECG February 01, 2016  . Atrial fibrillation (HCC) 02/01/2016  . Chronic combined systolic and diastolic CHF (congestive heart failure) (HCC) 2016-02-01  . Sepsis (HCC) 2016/02/01  . Cellulitis and abscess of  right lower extremity 2016/02/01    Palliative Care Assessment & Plan   Patient Profile:    Assessment:  GIB: EGD on 02/20/2016 with non bleeding duodenal ulcer with visible vessel s/p injection, clip. Also noted to have non bleeding esophageal ulcer, blood in D2 and D3.  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  Severe sepsis from morganella bacteremia/ RLE cellulitis Generalized anasarca, deconditioning Low BP/hemodynamic instability.   Recommendations/Plan:  patient now more confused, call placed and discussed with next of kin friend Mr Pamella Pert at [phone]:   Discussed about the patient's ongoing decline, patient appears chronically ill, worsening edema, worsening BP, Hgb, worsening renal function. He is at high risk for dying, prognosis could be as short as few hours to days. Discussed about ongoing efforts to stabilize the patient while honoring his wishes to not have PRBC transfusions. Discussed about continuing with DNR DNI as has been elected by the patient.   Patient does not have a spouse, he does not have any kids. He has brother and a sister who live in the area. Mr Buzzy Han will reach out to his siblings and they plan on coming to the hospital. Mr Buzzy Han is aware of the patient's tenuous condition.    Code Status:    Code Status Orders        Start     Ordered   2016-02-01 1537  Do not attempt resuscitation (DNR)  Continuous    Question Answer Comment  In the event of cardiac or respiratory ARREST Do not call a "code blue"   In the event of cardiac or respiratory ARREST Do not perform Intubation, CPR,  defibrillation or ACLS   In the event of cardiac or respiratory ARREST Use medication by any route, position, wound care, and other measures to relive pain and suffering. May use oxygen, suction and manual treatment of airway obstruction as needed for comfort.      01/12/2016 1537    Code Status History    Date Active Date Inactive Code Status  Order ID Comments User Context   This patient has a current code status but no historical code status.    Advance Directive Documentation   Flowsheet Row Most Recent Value  Type of Advance Directive  Healthcare Power of Attorney  Pre-existing out of facility DNR order (yellow form or pink MOST form)  No data  "MOST" Form in Place?  No data       Prognosis:   hours to days, discussed with next of kin friend Mr Buzzy HanDuvall.   Discharge Planning:  Anticipate hospital death, discussed with next of kin Mr Buzzy HanDuvall.   Care plan was discussed with  Patient.   Thank you for allowing the Palliative Medicine Team to assist in the care of this patient.   Time In: 9 Time Out: 935 Total Time 35 Prolonged Time Billed  no       Greater than 50%  of this time was spent counseling and coordinating care related to the above assessment and plan.  Rosalin HawkingZeba Dilon Lank, MD [phone]  Please contact Palliative Medicine Team phone at [phone] for questions and concerns.

## 2016-03-05 NOTE — Progress Notes (Signed)
Pt began complaining of 10/10 constant chest pain at 0345. [facility] notified of BP 60/40 and very limited urine output. Orders placed to start 1L NS bolus and give sublingual nitroglycerin and IV dilaudid if needed. Will continue to monitor.   Sinclair GroomsNivi Rinnah Peppel, RN

## 2016-03-05 NOTE — Discharge Summary (Signed)
Death Summary  Vincent HatchetJohn W Gibson XBJ:[id number] DOB: [birth year] DOA: 01/27/2016  PCP: Georgianne FickAMACHANDRAN,AJITH, MD PCP/Office notified:   Admit date: 01/26/2016 Date of Death: 02/13/2016  Final Diagnoses:  Principal Problem:   Sepsis (HCC) Active Problems:   AKI (acute kidney injury) (HCC)   Urinary retention   Prolonged Q-T interval on ECG   Atrial fibrillation (HCC)   Chronic combined systolic and diastolic CHF (congestive heart failure) (HCC)   Cellulitis and abscess of right lower extremity   Pressure injury of skin   Rhabdomyolysis   GI bleeding   Coagulopathy (HCC)   Lactic acidosis   Elevated troponin   Shock (HCC)   Hypoalbuminemia   RBBB   Bacteremia   Acute respiratory failure (HCC)   Abdominal distention   Right heart failure   Ascites   Dysphagia, oropharyngeal phase   CHF (congestive heart failure) (HCC)   Encounter for palliative care   Goals of care, counseling/discussion    History of present illness:  75 y.o.malewith past medical history of AFib on coumadin, chronic systolic and diastolic CHF with EF (45-50%) and right heart failure, pulmonary HTN who was found down for 3 days in a bathtub. On arrival pt was alert and oriented, had dry mucous membranes and anasarca with leg swelling R>L with evidence of cellulitis on the right leg. Rectal temperature was 96.74F, glucose 26, he was hypotensive and tachycardic saturating 90% on room air, 99% with 2L O2. Labs were significant for creatinine 3.32, BUN 86, bicarbonate 21, CK 528, troponin 0.36, BNP 2,155. Abdomen with chest x-ray showed abnormal increased left hilar and retrocardiac density in the chest, findings suggestive of ascites and colonic ileus. INR on arrival was supratherapeutic at 6 and as high as 10 on 10/21 for which he was given oral vitamin K. CT imaging showed cirrhosis (no known history of this) and large volume ascites.   10/23 early AM, pt had about 900cc coffee ground emesis and hypotension,  presumably secondary to variceal bleed in the setting of supratherapeutic INR. NG tube was placed, patient was given IV fluid boluses, octreotide and Protonix drip started and patient again given vitamin K. He remained hypotensive despite fluid challenge and hospital course complicated with third spacing in the setting of anasarca. Patient is Jehovah's Witness and has declined blood transfusion but he was agreeable to albumin and [employer]. He has required short course of levophed (weaned 01/26/2016). Patient underwent paracentesis of 02/01/2016 with 7 L fluid removed.  Pt continues to decline clinically. He remains hypotensive and I spoke with the pt about having pressor support but he declined. Appreciate palliative care assistance continuing to address goals of care.  Hospital Course:   Assessment & Plan:  Hypovolemic/septic shock in the setting of hematemesis with acute blood loss anemia and supratherapeutic INR - Patient has required short course of levo fed, this was weaned off 01/26/2016 - Patient continues to be in hypotensive shock, blood pressure 72/46 this morning. I spoke with him about initiating pressor support even though he is DO NOT RESUSCITATE but he declined having pressors - Please note patient had EGD 05-17-15 with findings of gastritis and healing duodenal ulcer but no varices - EGD repeated 02/27/2016 - medium size hiatal hernia, non bleeding esophageal ulcers, chronic gastritis, non bleeding duodenal ulcer with a visible vessel, injected and clips were placed; blood in second and third portion of the duodenum  - Continued Protonix drip - Hemoglobin is 5 this morning, as noted above he is Jehovah's Witness and has  declined blood products - Given Feraheme today  Cirrhosis/nonalcoholic fatty liver disease/recurrent ascites - Paracentesis 10/29, 7L were removed on 10/29 - Abd distended but he cannot have paracentesis due to hypotension  Severe sepsis from Morganella  bacteremia/RLEcellulitis - Continues to be hemodynamically unstable with atrial fibrillation with rapid ventricular response - Patient found to have 1/2 morganella species in blood cultures that are sensitive to Rocephin.  - Rocephin was started on 10/23 which he will continue for a total of 2 weeks - Dr. Dulce Sellarutlaw recommended to continue antibiotics in the setting of GI bleeding/ascites-can transition to oral Cipro when ready to discharge from the hospital  Atrial fibrillationwith rapid ventricular rate - CHADS vasc score 3 - Patient is not on beta blockers due to hypotension - Patient is not on any anticoagulation secondary to acute blood loss anemia and risk of bleeding - Rhythm controlled with IV amiodarone   Mild troponin elevation - Demand ischemia from CKD - No reports of chest pain  Anemia of chronic disease / acute blood loss anemia - Patient is Jehovah's Witness and declined blood transfusion  Hypernatremia - Sodium 146, likely secondary to blood loss anemia, dehydration  Acute renal failure superimposed on chronic kidney disease stage III / hepatorenal syndrome - Patient presented with a creatinine of 3.32, baseline creatinine around 1.54 - Presenting renal failure possibly secondary to rhabdomyolysis vs acute heart failure - Creatinine continues to trend up, 3.47 this morning, up from yesterday's value of 2.82 - His overall edematous status precludes use of IV fluids although he is on low rate IV fluids 20 mL an hour  Severe hypokalemia - Due to renal failure, sepsis - Supplemented and WNL  Acute on chronic systolic and diastolic heart failure and right heart failure and pulmonary hypertension - EF of 45-50% with mild LVH - Appreciate cardiology following  - Not on Lasix due to hypotension  - Remains edematous   Acute respiratory failure with hypoxia  - Stable respiratory status  Colonic ileus -Tolerating diet  Ascites, SBP / Abdominal distention  -  Large volume of ascites noted on recent imaging studies. - Status post large volume paracentesis 02/01/2016 with 7 L fluid removed  - Fluid analysis suggestive of SBP  - Continued Rocephin   Mild rhabdomyolysis - CK improved  Severe deconditioning - PT consulted. Recommendations for SNF at time of discharge  Prolonged QTc interval:  - Presented QTC of 537. - Stable   Urinary retention - Noted on earlier CT abd - Continued foley catheter    DVT prophylaxis:SCD's bilaterally due to risk of bleeding  Code Status:DNR/DNI    Consultants:  Gastroenterology  Cardiology  Pulmonary critical care  Discussed case with hematology (Dr. Shirline FreesMohammed)  Palliative care   Procedures:  Foley placement by Dr. [facility] on 08/17/2015  Right lower extremity Doppler ultrasound 08/17/2015 negative for DVT  Right upper extremity Doppler ultrasound on 08/17/2015 negative for SVT or DVT  Patient developed hematemesis on 01/25/2016, initiated Levothroid until 01/26/2016  EGD 01/12/2016  Ultrasound-guided paracentesis by IR on 02/01/2016 yielding 7 L of ascitic fluid  EGD 02/26/2016   Antimicrobials:   Vancomycin 10/20 --> 10/23  Rocephin 10/23 -->  Zosyn 10/20-10/23    Time: 20:51 on 02/17/2016  Signed:  Manson PasseyEVINE, ALMA  [facility] 02/13/2016, 10:43 PM

## 2016-03-05 NOTE — Progress Notes (Signed)
Subjective:  Denies CP or dyspnea   Objective:  Vitals:   02/27/2016 0429 02/18/2016 0500 02/04/2016 0600 02/10/2016 0700  BP:  (!) 68/38 (!) 68/37 (!) 72/46  Pulse:  90 (!) 102 87  Resp:  (!) 25 (!) 21 (!) 27  Temp:      TempSrc:      SpO2:  100% 100% 100%  Weight: 246 lb 4.1 oz (111.7 kg)     Height:        Intake/Output from previous day:  Intake/Output Summary (Last 24 hours) at 02/21/2016 0759 Last data filed at 02/27/2016 0600  Gross per 24 hour  Intake          2833.82 ml  Output              110 ml  Net          2723.82 ml    Physical Exam: Physical exam: Well-developed chronically ill appearing in no acute distress. Anasarca worse compared to previous Skin is warm and dry.  HEENT is normal.  Neck is supple.  Chest is clear to auscultation with normal expansion.  Cardiovascular exam is irregular Abdominal exam nontender; distended Extremities show chronic skin changes and 2-3+ edema. neuro grossly intact    Lab Results: Basic Metabolic Panel:  Recent Labs  21/30/8609/05/22 2339  02/07/16 0508 02/21/2016 0430  NA 147*  < > 146* 146*  K 3.5  < > 3.8 3.6  CL 106  < > 105 108  CO2 34*  < > 29 27  GLUCOSE 126*  < > 128* 102*  BUN 46*  < > 57* 67*  CREATININE 2.03*  < > 2.82* 3.47*  CALCIUM 7.6*  < > 7.8* 7.8*  MG 1.8  --   --   --   < > = values in this interval not displayed. CBC:  Recent Labs  02/07/16 0930 03/04/2016 0430  WBC 12.0* 13.0*  HGB 5.2* 5.0*  HCT 15.3* 14.2*  MCV 92.7 91.6  PLT 137* 135*     Assessment/Plan:  This is a 75 y.o.male has apast medical history significant for A-fib, chronic combined S/D CHF (EF 45-50% (12/2015)), hypertension, dyslipidemia and mild right heart failure with moderate pulmonary hypertension who presented after a fall in his bathtub. Found to have acute renal failure and mild rhabomyolysis. Went into septic shock and had acute GIB as well. Now off warfarin. Troponin mildly elevated at 0.25, 0.36.   1 persistent  atrial fibrillation-heart rate is improved; continue IV amiodarone. Can convert to po later if he survives. No anticoagulation given recurrent GI bleed.  2 acute combined systolic/diastolic congestive heart failure, right heart failure, pulmonary hypertension-he remains volume overloaded but renal function worse (likely due to hypovolemic shock in setting of GI bleed). There is also a component of decreased oncotic pressure from hypoalbuminemia. Diuretics on hold.  3 GI bleed-patient is status post injection/clipping of ulcer. Continue protonix. He remains hypotensive and Hgb 5. He is getting normal saline. 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 Prognosis poor.   4 acute kidney disease-renal function had been improving but now worse. He is being gently hydrated in the setting of GI bleed and inability to transfuse.  5 cirrhosis  Pt continues to be in shock following GI bleed; now with hgb 5 and inability to transfuse (Jehovah's witness); renal function worsening. I do not believe he will survive. Comfort care would be appropriate.  Olga MillersBrian Hakop Humbarger 03/03/2016, 7:59 AM

## 2016-03-05 NOTE — Progress Notes (Signed)
[facility]  Vincent HatchetJohn W Gibson 75 y.o. Jun 28, 1940   Subjective: Somnolent. Very lethargic response to simple commands.  Objective: Vital signs in last 24 hours: Vitals:   02/12/2016 0600 02/04/2016 0700  BP: (!) 68/37 (!) 72/46  Pulse: (!) 102 87  Resp: (!) 21 (!) 27  Temp:     T 97.7  Physical Exam: Gen: somnolent, elderly, frail CV: RRR Chest: Bilateral crackles anteriorly Abd: diffuse tenderness with guarding, +distention, +BS  Lab Results:  Recent Labs  02/05/16 2339  02/07/16 0508 02/22/2016 0430  NA 147*  < > 146* 146*  K 3.5  < > 3.8 3.6  CL 106  < > 105 108  CO2 34*  < > 29 27  GLUCOSE 126*  < > 128* 102*  BUN 46*  < > 57* 67*  CREATININE 2.03*  < > 2.82* 3.47*  CALCIUM 7.6*  < > 7.8* 7.8*  MG 1.8  --   --   --   < > = values in this interval not displayed.  Recent Labs  02/07/16 0508 02/19/2016 0430  AST 64* 65*  ALT 18 16*  ALKPHOS 82 83  BILITOT 2.3* 2.1*  PROT 5.5* 5.5*  ALBUMIN 1.8* 1.9*    Recent Labs  02/07/16 0930 02/07/2016 0430  WBC 12.0* 13.0*  HGB 5.2* 5.0*  HCT 15.3* 14.2*  MCV 92.7 91.6  PLT 137* 135*    Recent Labs  02/05/16 2339  LABPROT 20.5*  INR 1.73      Assessment/Plan: Sepsis with recent upper GI bleed - s/p hemoclipping of visible vessel of duodenal ulcer. Hgb continues to fall and patient refusing blood products for religious reasons (Jehovah's Witness). Cirrhosis and worsening renal function. Altered mental status likely multifactorial. Poor prognosis. Agree with palliative measures. Will sign off. Call if questions.   Randel Hargens C. 02/17/2016, 10:54 AM  Pager [phone]  If no answer or after 5 PM call [phone] ID: Vincent HatchetJohn W Gibson, male   DOB: Jun 28, 1940, 75 y.o.   MRN: [id number]

## 2016-03-05 DEATH — deceased

## 2017-01-05 IMAGING — DX DG ABD PORTABLE 1V
2 series · 2 of 2 positions shown · non-contrast
Comparison: 01/23/2016

CLINICAL DATA: Ileus

EXAM:
PORTABLE ABDOMEN - 1 VIEW

[abdomen kub (1 of 2)]
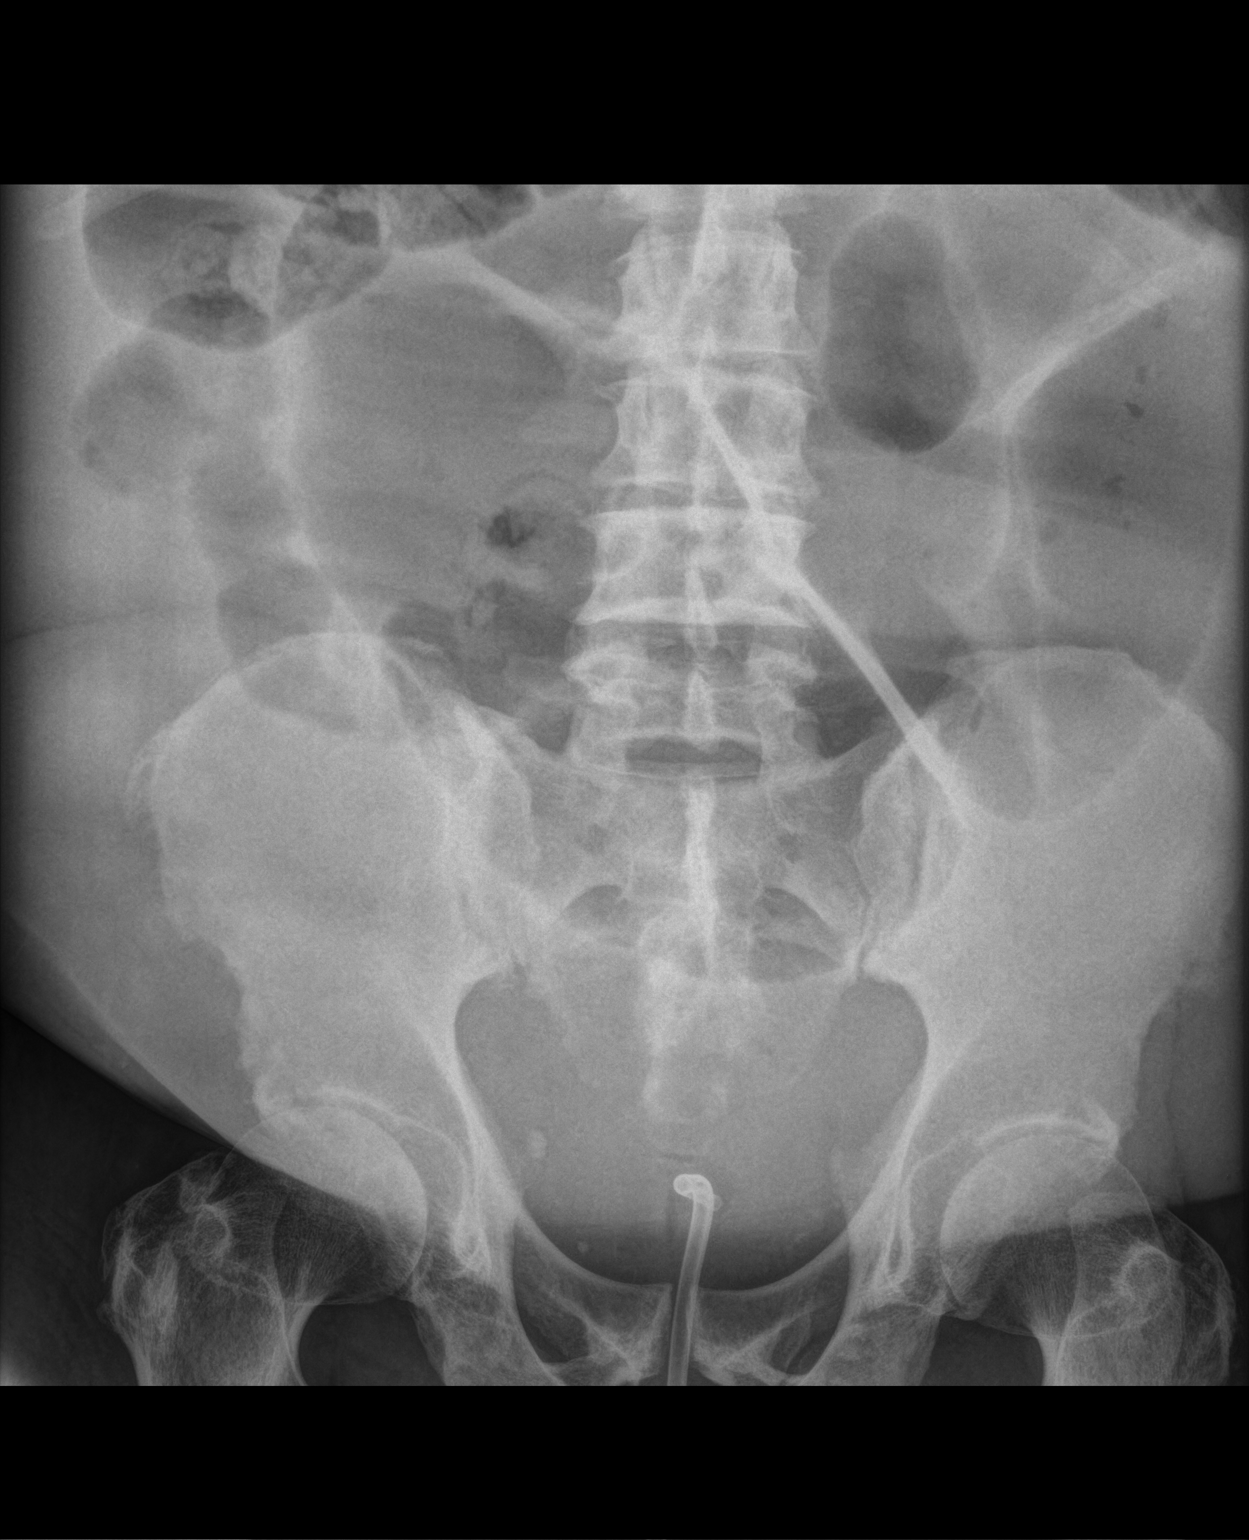

[abdomen kub (2 of 2)]
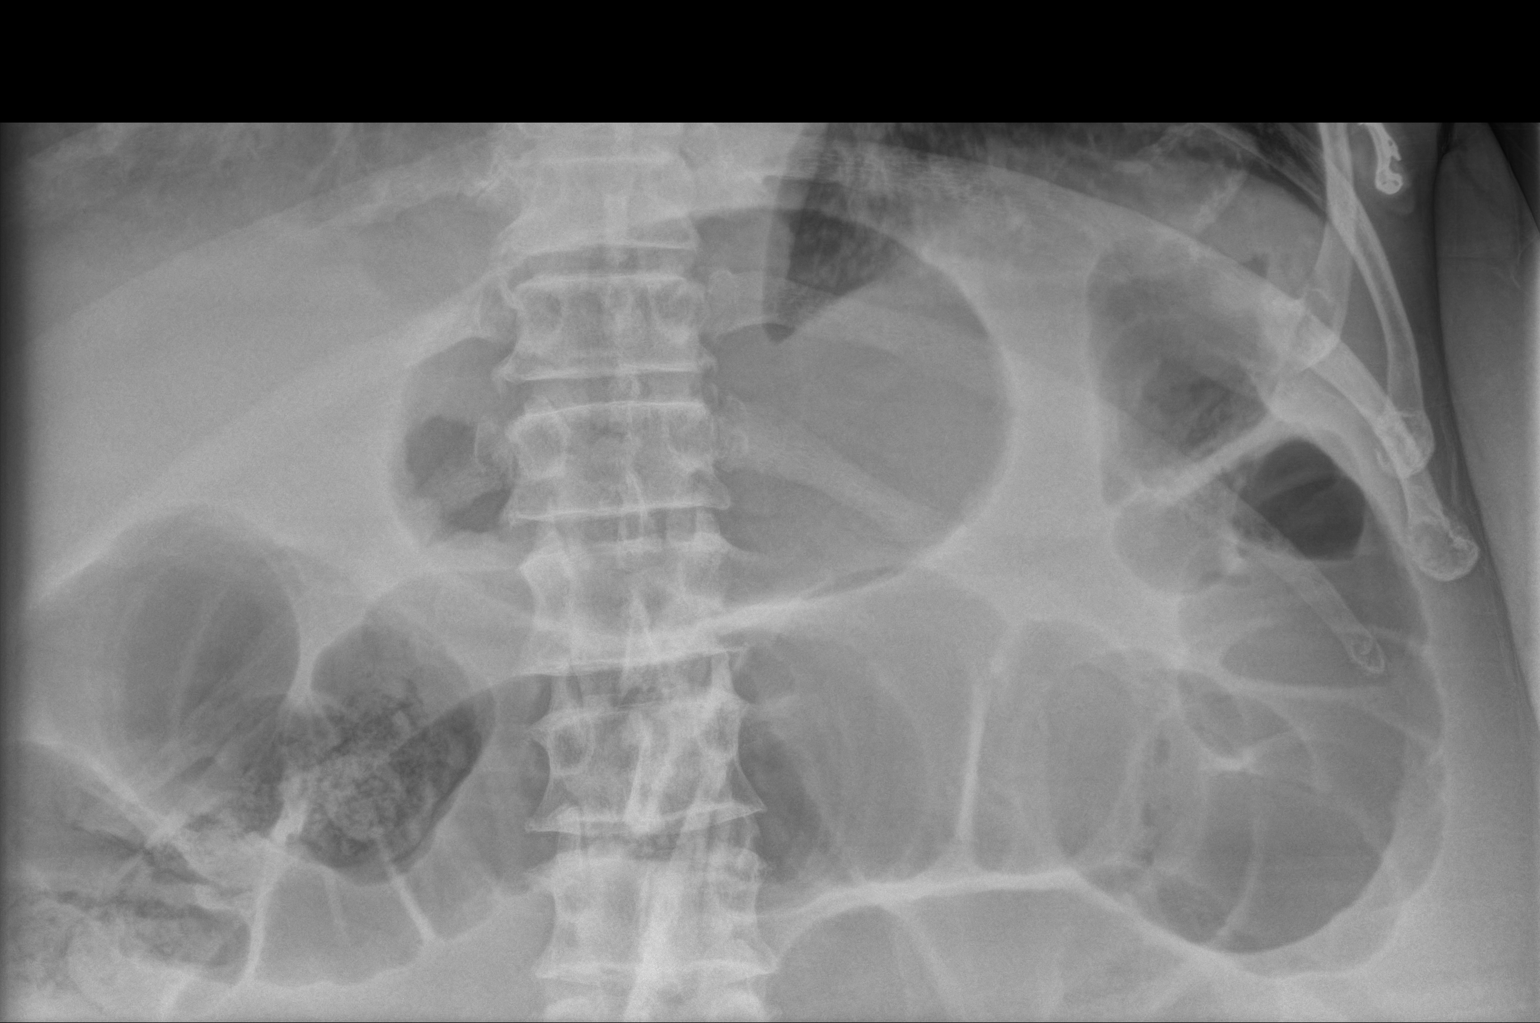

[2 of 2 positions shown; findings below may reference images not displayed]

FINDINGS: Scattered large and small bowel gas is noted. Gas distension of the
colon is again identified and stable from the prior exam. The
overall appearance is similar to that seen on the previous day. No
free air is noted. Foley catheter is noted.
IMPRESSION: Changes consistent with a colonic ileus. Correlation with physical
exam is recommended. No significant change from the previous day is
seen.

## 2017-01-05 IMAGING — CT CT CHEST W/O CM
4 of 8 series · 15 of 36 positions shown, 17 images · non-contrast
Comparison: 01/24/2016 abdominal radiographs and 01/23/2016 chest
radiographs. 07/02/2014 CT abdomen/pelvis.

CLINICAL DATA: 75-year-old male inpatient with a history of atrial
fibrillation on Coumadin, CHF, admitted after being found down for 3
days in the bathtub, treated for sepsis and acute renal failure, now
with acute respiratory failure, ileus and abdominal distention. Left
lung opacities on chest radiograph.

EXAM:
CT CHEST, ABDOMEN AND PELVIS WITHOUT CONTRAST
TECHNIQUE: Multidetector CT imaging of the chest, abdomen and pelvis was
performed following the standard protocol without IV contrast.

[Series 2: axial st · axial · 0.94mm/px · z∈[-492,+18]mm · 7 of 137 slices shown, 9 images (1 of 2)]
[im 18/137  mediastinal]
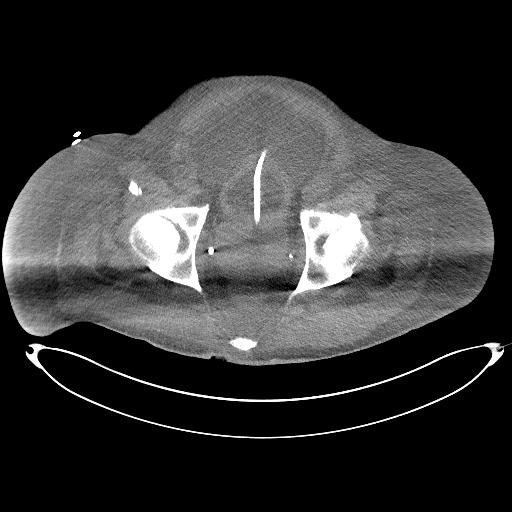
[im 18/137  lung]
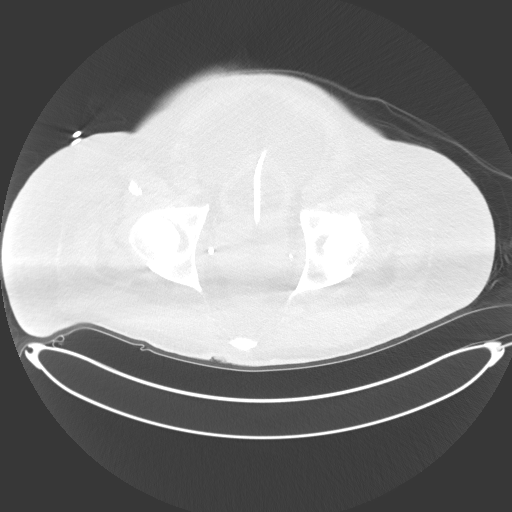
[im 35/137  lung]
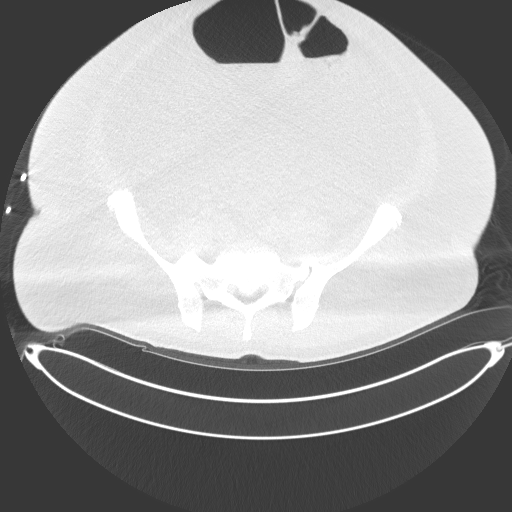
[im 52/137  lung]
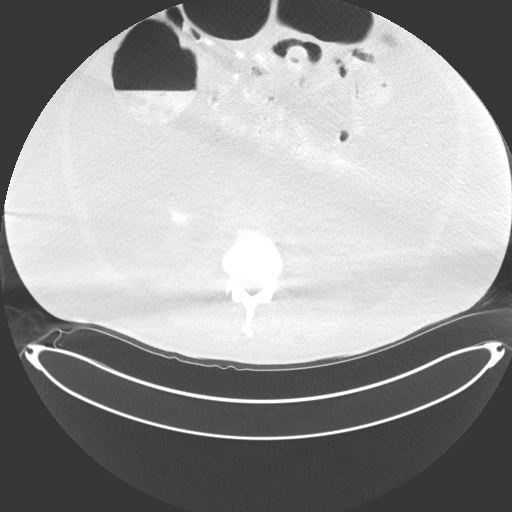
[im 69/137  lung]
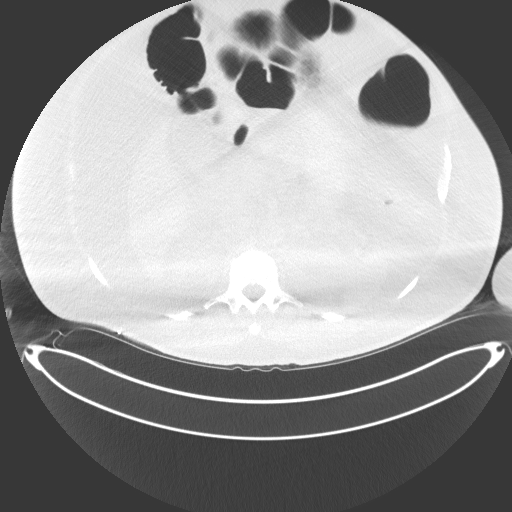
[im 86/137  mediastinal]
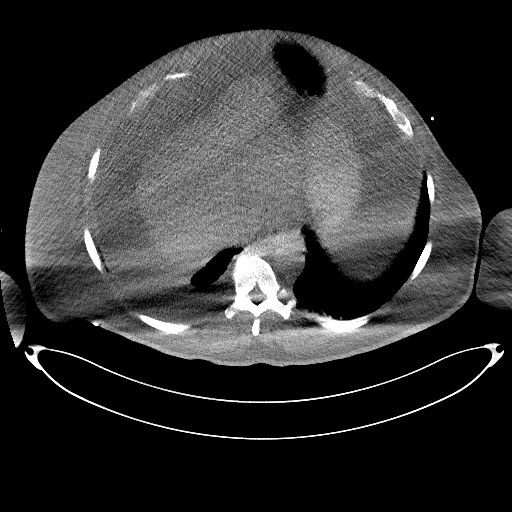
[im 86/137  lung]
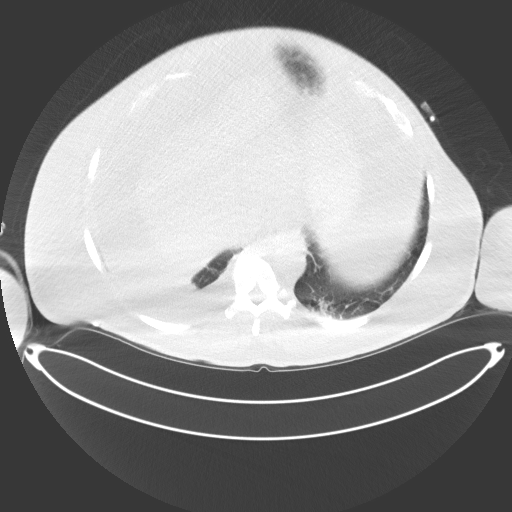
[im 103/137  lung]
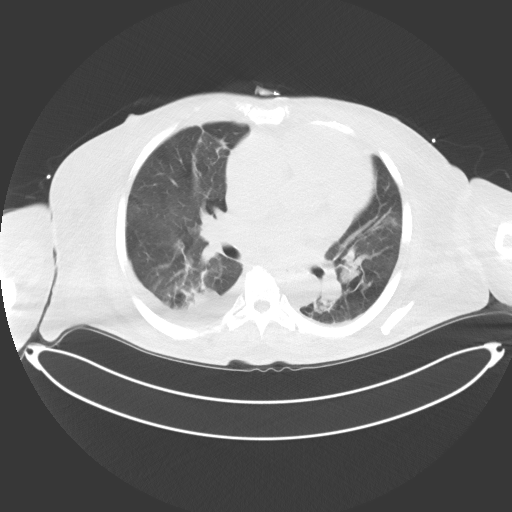
[im 120/137  lung]
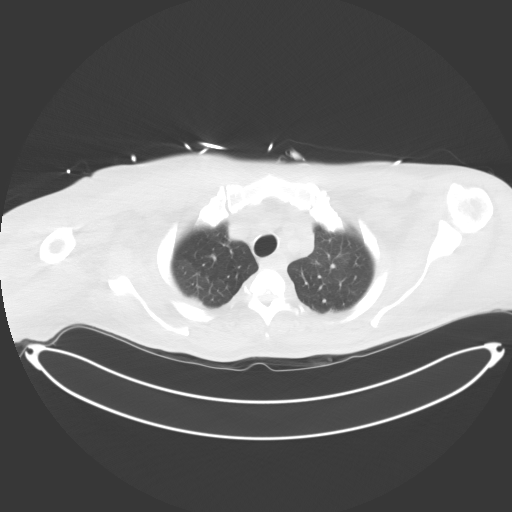

[Series 4: axial st · axial · 0.94mm/px · z∈[-474,-164]mm · 4 of 104 slices shown (2 of 2)]
[im 21/104  lung]
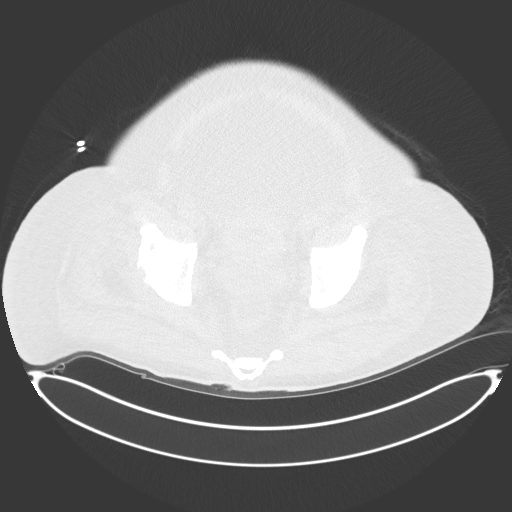
[im 42/104  lung]
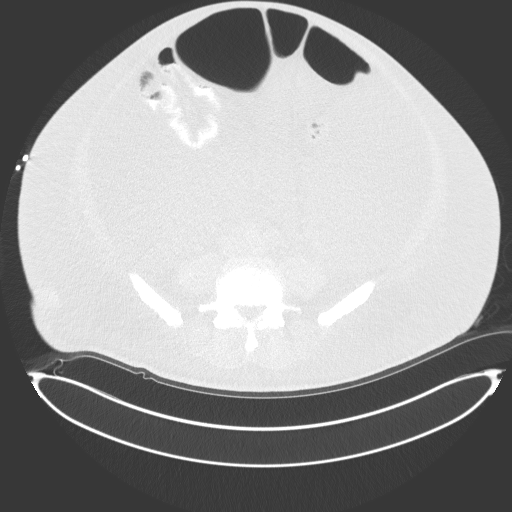
[im 62/104  lung]
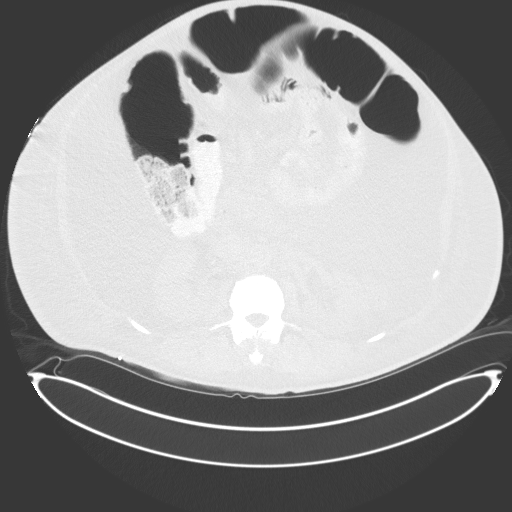
[im 83/104  lung]
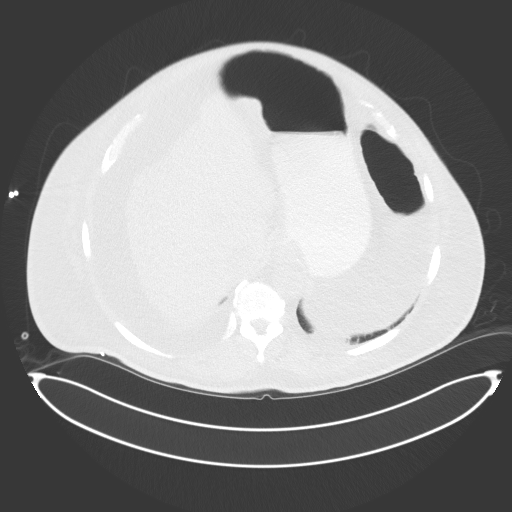

[Series 6: coronal · coronal · 1.33mm/px · 1 of 175 slices shown]
[im 88/175  lung]
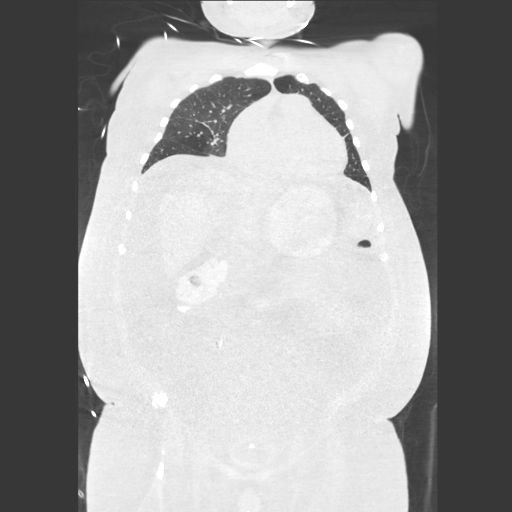

[Series 8: lungs · axial · 0.94mm/px · z∈[-171,-93]mm · 3 of 157 slices shown]
[im 20/157  lung]
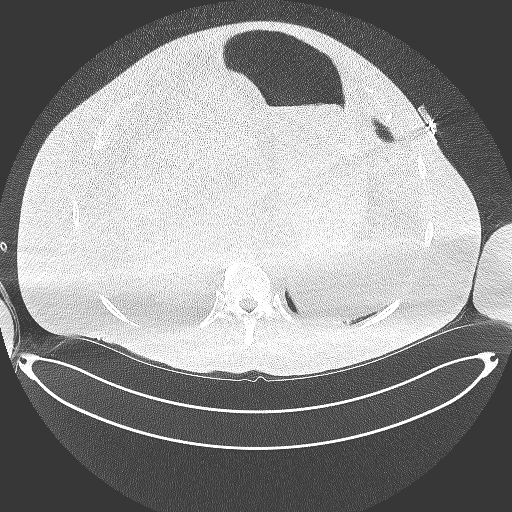
[im 40/157  lung]
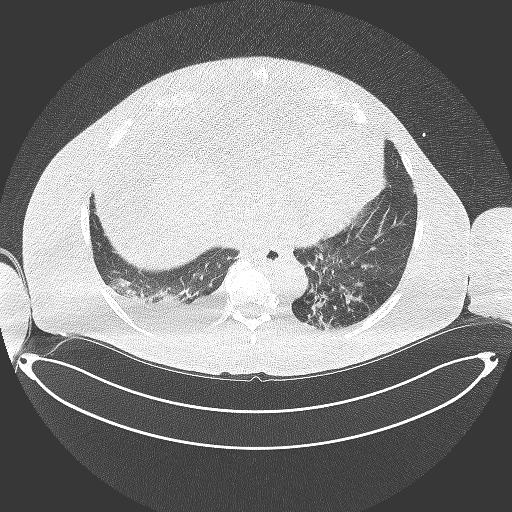
[im 59/157  lung]
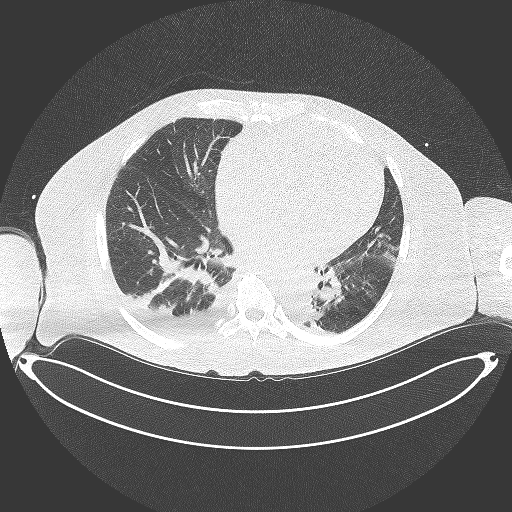

[15 of 36 positions shown; findings below may reference images not displayed]

FINDINGS: Examination is significantly limited by patient body habitus, streak
artifact from the patient's upper extremities, motion artifact and
lack of IV contrast.

CT CHEST FINDINGS

Cardiovascular: Mild cardiomegaly. No significant pericardial
fluid/thickening. Mildly atherosclerotic nonaneurysmal thoracic
aorta. Dilated main pulmonary artery (3.7 cm diameter).

Mediastinum/Nodes: No discrete thyroid nodules. Unremarkable
esophagus. No pathologically enlarged axillary, mediastinal or gross
hilar lymph nodes, noting limited sensitivity for the detection of
hilar adenopathy on this noncontrast study.

Lungs/Pleura: No pneumothorax. Small layering right pleural
effusion. No left pleural effusion. There is layering debris in the
tracheal lumen at the level of the thoracic inlet. There is a mosaic
attenuation throughout both lungs. There are patchy bandlike areas
of consolidation with associated volume loss in the bilateral lower
lobes, favor atelectasis. Thin parenchymal bands in the right middle
lobe and lingula are most consistent with areas of postinfectious/
postinflammatory scarring. No lung masses or significant pulmonary
nodules in the aerated portions of the lungs.

Musculoskeletal: No aggressive appearing focal osseous lesions.
Mild-to-moderate thoracic spondylosis. Anasarca.

CT ABDOMEN PELVIS FINDINGS

Hepatobiliary: There is relative hypertrophy of the left liver lobe
and the liver surface is diffusely irregular, consistent with
cirrhosis. No gross liver mass. Normal gallbladder with no
radiopaque cholelithiasis. No biliary ductal dilatation.

Pancreas: Normal, with no mass or duct dilation.

Spleen: Normal size. No mass.

Adrenals/Urinary Tract: No discrete adrenal nodules. No
hydronephrosis. No renal stones. Simple 2.3 cm posterior upper right
renal cyst. Simple 3.1 cm lateral interpolar left renal cyst. No
additional contour deforming renal masses. Bladder is nearly
collapsed by indwelling Foley catheter, with the suggestion of
diffuse bladder wall thickening.

Stomach/Bowel: Grossly normal stomach. Normal caliber small bowel
with no small bowel wall thickening. Faintly visualized normal
appendix. There is mild dilatation throughout the large bowel with
mild stool and scattered fluid levels in the large bowel. No large
bowel wall thickening.

Vascular/Lymphatic: Mildly atherosclerotic nonaneurysmal abdominal
aorta. No gross pathologically enlarged lymph nodes in the abdomen
or pelvis.

Reproductive: Markedly enlarged prostate, not appreciably changed.

Other: No pneumoperitoneum.  Large volume simple ascites.

Musculoskeletal: No aggressive appearing focal osseous lesions.
Moderate lumbar spondylosis. Anasarca.
IMPRESSION: 1. Cirrhosis. No gross liver mass on this limited noncontrast CT
study. Large volume ascites. Normal size spleen.
2. Mild diffuse large bowel dilatation with colonic fluid levels,
consistent with mild colonic ileus.
3. Anasarca.
4. Cardiomegaly.  Small layering right pleural effusion.
5. Prominently dilated main pulmonary artery, likely indicating
pulmonary arterial hypertension. Mosaic attenuation throughout the
lungs could be due to mosaic perfusion from pulmonary vascular
disease versus air trapping from small airways disease.
6. Patchy bandlike areas of consolidation with associated volume
loss in the lower lungs, favor atelectasis.
7. Markedly enlarged prostate. Chronic mild diffuse bladder wall
thickening, probably due to chronic bladder outlet obstruction.
Bladder nearly collapsed by indwelling Foley catheter. No
hydronephrosis.
8. Aortic atherosclerosis.

## 2017-01-06 IMAGING — DX DG CHEST 1V PORT
1 series · 1 of 1 positions shown · non-contrast
Comparison: [DATE]

CLINICAL DATA: Respiratory distress,  hypotension

EXAM:
PORTABLE CHEST 1 VIEW

[chest ap]
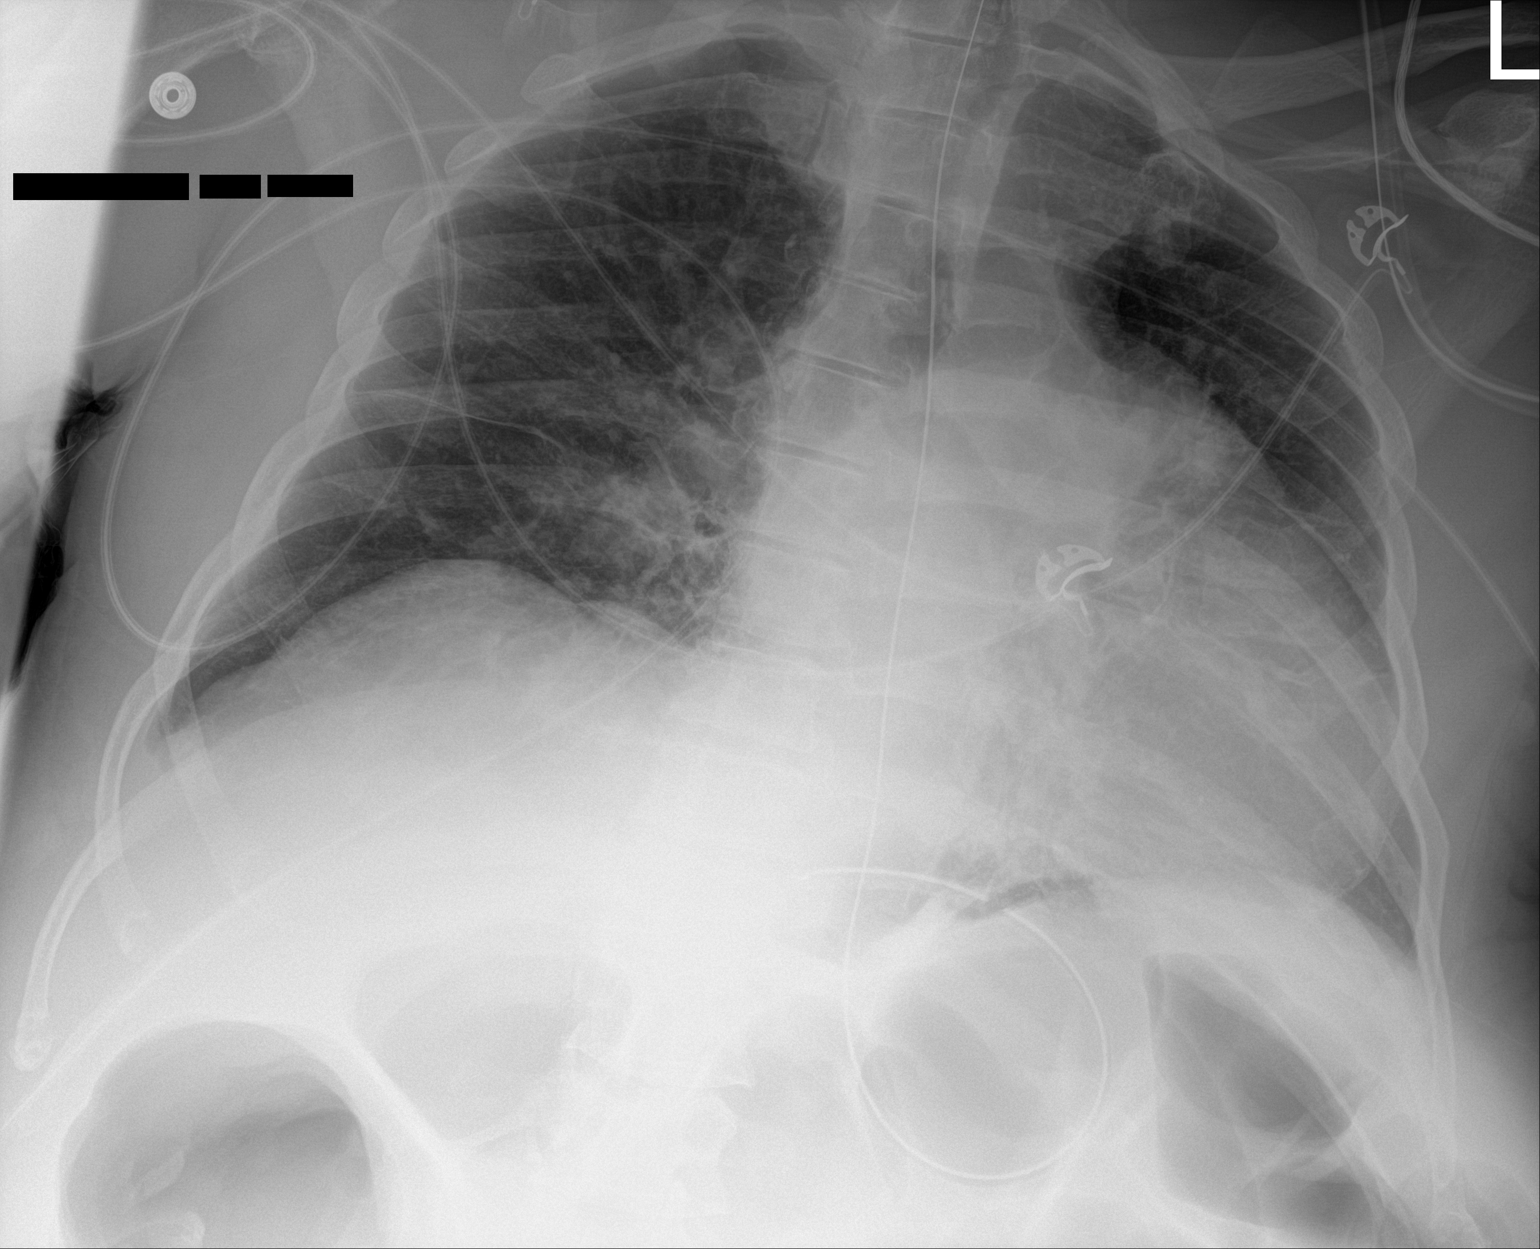

[1 of 1 positions shown; findings below may reference images not displayed]

FINDINGS: Cardiomegaly is noted. There is NG tube coiled within stomach with
tip in proximal stomach. Elevation of the right hemidiaphragm again
noted. Persistent streaky left base retrocardiac atelectasis or
infiltrate.
IMPRESSION: There is NG tube coiled within stomach with tip in proximal stomach.
Elevation of the right hemidiaphragm again noted. Persistent streaky
left base retrocardiac atelectasis or infiltrate.

## 2017-01-06 IMAGING — DX DG CHEST 1V PORT
1 series · 1 of 1 positions shown · non-contrast
Comparison: 01/25/2016

CLINICAL DATA: PICC line placement.  Nasogastric tube placement.

EXAM:
PORTABLE CHEST 1 VIEW

[chest ap]
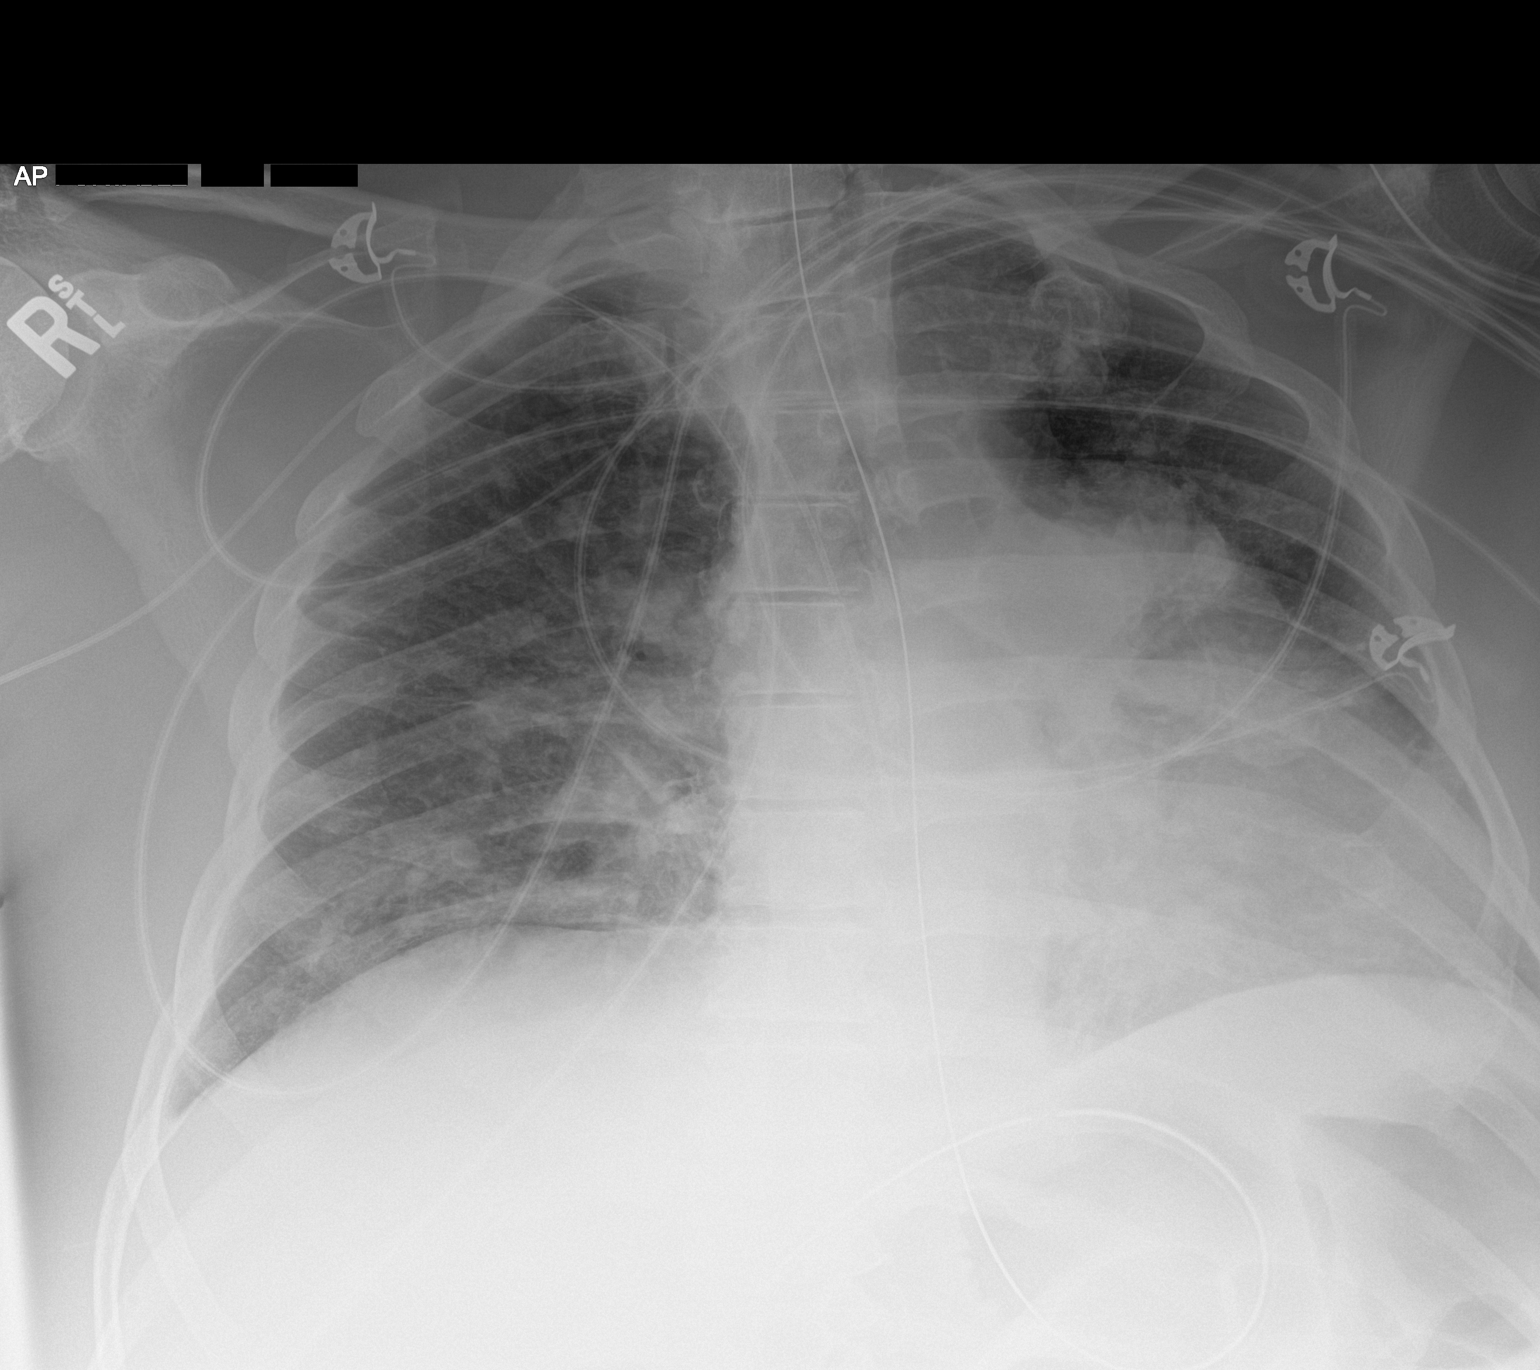

[1 of 1 positions shown; findings below may reference images not displayed]

FINDINGS: Nasogastric tube tip is in the stomach body. Right-sided PICC line
tip projects over the SVC.

Leftward shift of cardiac and mediastinal structures. Bilateral
interstitial accentuation. Enlarged main pulmonary artery. Left
costophrenic angle excluded.
IMPRESSION: 1. Nasogastric tube tip in the stomach body, PICC line tip in the
SVC.
2. Potential mild cardiomegaly, with some leftward shift of the
heart possibly due to left-sided atelectasis.
3. Prominent main pulmonary artery, potentially with some left
perihilar atelectasis.
4. Interstitial accentuation could be from atypical pneumonia or
mild interstitial edema.
